# Patient Record
Sex: Female | Born: 1975 | Race: Black or African American | Hispanic: No | Smoking: Never smoker
Health system: Southern US, Community
[De-identification: ages and names within clinical notes are randomized; demographics above are authoritative.]

## PROBLEM LIST (undated history)

## (undated) DIAGNOSIS — J45909 Unspecified asthma, uncomplicated: Secondary | ICD-10-CM

## (undated) DIAGNOSIS — E559 Vitamin D deficiency, unspecified: Secondary | ICD-10-CM

## (undated) DIAGNOSIS — K922 Gastrointestinal hemorrhage, unspecified: Secondary | ICD-10-CM

## (undated) DIAGNOSIS — J069 Acute upper respiratory infection, unspecified: Secondary | ICD-10-CM

## (undated) DIAGNOSIS — R945 Abnormal results of liver function studies: Secondary | ICD-10-CM

## (undated) HISTORY — PX: LIVER SURGERY: SHX698

## (undated) HISTORY — PX: CHOLECYSTECTOMY, LAPAROSCOPIC: SHX56

## (undated) HISTORY — DX: Acute upper respiratory infection, unspecified: J06.9

## (undated) HISTORY — PX: CHOLECYSTECTOMY: SHX55

## (undated) HISTORY — PX: NO PAST SURGERIES: SHX2092

---

## 2003-05-04 DIAGNOSIS — R945 Abnormal results of liver function studies: Secondary | ICD-10-CM

## 2003-05-04 DIAGNOSIS — R7989 Other specified abnormal findings of blood chemistry: Secondary | ICD-10-CM

## 2003-05-04 HISTORY — DX: Other specified abnormal findings of blood chemistry: R79.89

## 2003-05-04 HISTORY — DX: Abnormal results of liver function studies: R94.5

## 2014-05-03 DIAGNOSIS — K922 Gastrointestinal hemorrhage, unspecified: Secondary | ICD-10-CM

## 2014-05-03 HISTORY — DX: Gastrointestinal hemorrhage, unspecified: K92.2

## 2017-02-01 ENCOUNTER — Encounter (HOSPITAL_COMMUNITY): Payer: Self-pay | Admitting: Emergency Medicine

## 2017-02-01 ENCOUNTER — Ambulatory Visit (HOSPITAL_COMMUNITY)
Admission: EM | Admit: 2017-02-01 | Discharge: 2017-02-01 | Disposition: A | Discharge status: Home / Self Care | Payer: 59 | Attending: Family Medicine | Admitting: Family Medicine

## 2017-02-01 DIAGNOSIS — J4521 Mild intermittent asthma with (acute) exacerbation: Secondary | ICD-10-CM

## 2017-02-01 DIAGNOSIS — R059 Cough, unspecified: Secondary | ICD-10-CM

## 2017-02-01 DIAGNOSIS — R05 Cough: Secondary | ICD-10-CM

## 2017-02-01 HISTORY — DX: Unspecified asthma, uncomplicated: J45.909

## 2017-02-01 MED ORDER — PREDNISONE 10 MG (21) PO TBPK: ORAL_TABLET | Freq: Every day | ORAL | 0 refills | Status: DC

## 2017-02-01 MED ORDER — FLUTICASONE-SALMETEROL 500-50 MCG/DOSE IN AEPB: 1.0000 | INHALATION_SPRAY | Freq: Two times a day (BID) | RESPIRATORY_TRACT | 1 refills | Status: DC

## 2017-02-01 MED ORDER — AZITHROMYCIN 250 MG PO TABS: 250.0000 mg | ORAL_TABLET | Freq: Every day | ORAL | 0 refills | Status: DC

## 2017-02-01 NOTE — ED Triage Notes (Signed)
Pt here for cold sx onset 1 month associated w/prod cough, nasal congestion, SOB, wheezing  Hx of asthma  Using her neb tx at home w/no relief  A&O x4... NAD... Ambulatory

## 2017-02-02 NOTE — ED Provider Notes (Signed)
  [place]   [id number] 02/01/17 Arrival Time: 5456  ASSESSMENT & PLAN:  1. Mild intermittent asthma with acute exacerbation   2. Cough     Meds ordered this encounter  Medications  . azithromycin (ZITHROMAX) 250 MG tablet    Sig: Take 1 tablet (250 mg total) by mouth daily. Take first 2 tablets together, then 1 every day until finished.    Dispense:  6 tablet    Refill:  0  . predniSONE (STERAPRED UNI-PAK 21 TAB) 10 MG (21) TBPK tablet    Sig: Take by mouth daily. Take as directed.    Dispense:  21 tablet    Refill:  0  . Fluticasone-Salmeterol (ADVAIR) 500-50 MCG/DOSE AEPB    Sig: Inhale 1 puff into the lungs 2 (two) times daily.    Dispense:  60 each    Refill:  1   To f/u in 24-48 hours if not improving, sooner if needed. Agrees to proceed to the ED if worsening. No indication for hospitalization at this time. Continue home nebs as needed.  Reviewed expectations re: course of current medical issues. Questions answered. Outlined signs and symptoms indicating need for more acute intervention. Patient verbalized understanding. After Visit Summary given.   SUBJECTIVE:  Monique Harrison is a 41 y.o. female who presents with complaint of cold symptoms for the past month. On/off congestion. Wheezing often. No specific SOB. Persistent dry cough. Using home nebs with very temporary relief. Normal PO intake without n/v. Afebrile. No specific aggravating or alleviating factors reported. No OTC treatment. Cannot remember last asthma exacerbation.  ROS: As per HPI. All other systems negative.   OBJECTIVE:  Vitals:   02/01/17 1802  BP: 98/68  Pulse: 93  Resp: 20  Temp: 98.1 F (36.7 C)  TempSrc: Oral  SpO2: 95%    General appearance: alert; no distress Eyes: PERRLA; EOMI; conjunctiva normal HENT: normocephalic; atraumatic; TMs normal; nasal mucosa normal; oral mucosa normal Neck: supple Lungs: expiratory wheezing bilaterally; no respiratory distress Heart:  regular rate and rhythm Extremities: no cyanosis or edema; symmetrical with no gross deformities Skin: warm and dry Psychological: alert and cooperative; normal mood and affect   Allergies  Allergen Reactions  . Aspirin Shortness Of Breath    Past Medical History:  Diagnosis Date  . Asthma    Social History   Social History  . Marital status: Divorced    Spouse name: N/A  . Number of children: N/A  . Years of education: N/A   Occupational History  . Not on file.   Social History Main Topics  . Smoking status: Never Smoker  . Smokeless tobacco: Never Used  . Alcohol use No  . Drug use: No  . Sexual activity: Yes   Other Topics Concern  . Not on file   Social History Narrative  . No narrative on file   FH of asthma.   Vanessa Kick, MD 02/02/17 [phone]

## 2017-02-21 ENCOUNTER — Ambulatory Visit (HOSPITAL_COMMUNITY)
Admission: EM | Admit: 2017-02-21 | Discharge: 2017-02-21 | Disposition: A | Discharge status: Home / Self Care | Payer: 59 | Attending: Emergency Medicine | Admitting: Emergency Medicine

## 2017-02-21 ENCOUNTER — Encounter (HOSPITAL_COMMUNITY): Payer: Self-pay

## 2017-02-21 DIAGNOSIS — J302 Other seasonal allergic rhinitis: Secondary | ICD-10-CM

## 2017-02-21 DIAGNOSIS — J4520 Mild intermittent asthma, uncomplicated: Secondary | ICD-10-CM | POA: Diagnosis not present

## 2017-02-21 MED ORDER — PREDNISONE 10 MG (21) PO TBPK: ORAL_TABLET | Freq: Every day | ORAL | 0 refills | Status: DC

## 2017-02-21 MED ORDER — ALBUTEROL SULFATE HFA 108 (90 BASE) MCG/ACT IN AERS: 1.0000 | INHALATION_SPRAY | Freq: Four times a day (QID) | RESPIRATORY_TRACT | 0 refills | Status: DC | PRN

## 2017-02-21 MED ORDER — CETIRIZINE HCL 10 MG PO TABS: 10.0000 mg | ORAL_TABLET | Freq: Every day | ORAL | 0 refills | Status: DC

## 2017-02-21 NOTE — ED Provider Notes (Signed)
[name]    CSN: [id number] Arrival date & time: 02/21/17  1711     History   Chief Complaint Chief Complaint  Patient presents with  . Allergies    HPI Monique Harrison is a 41 y.o. female.   Monique Harrison presents with complaints of wheezing, shortness of breath, runny nose, congestion scratchy throat and itching eyes. She has a history of asthma, was recently treated with prednisone which helped her symptoms. She went out of town and return, causing increase in symptoms for the past two days ago. She has been using ventolin and advair as well as nebulizers three times a day for her symptoms. She has not been taking daily allergy medication. Without chest pain.       Past Medical History:  Diagnosis Date  . Asthma     There are no active problems to display for this patient.   History reviewed. No pertinent surgical history.  OB History    No data available       Home Medications    Prior to Admission medications   Medication Sig Start Date End Date Taking? Authorizing Provider  Fluticasone-Salmeterol (ADVAIR) 500-50 MCG/DOSE AEPB Inhale 1 puff into the lungs 2 (two) times daily. 02/01/17  Yes Hagler, Aaron Edelman, MD  albuterol (PROVENTIL HFA;VENTOLIN HFA) 108 (90 Base) MCG/ACT inhaler Inhale 1 puff into the lungs every 6 (six) hours as needed for wheezing or shortness of breath. 02/21/17   Zigmund Gottron, NP  azithromycin (ZITHROMAX) 250 MG tablet Take 1 tablet (250 mg total) by mouth daily. Take first 2 tablets together, then 1 every day until finished. 02/01/17   Vanessa Kick, MD  cetirizine (ZYRTEC ALLERGY) 10 MG tablet Take 1 tablet (10 mg total) by mouth daily. 02/21/17   Zigmund Gottron, NP  predniSONE (STERAPRED UNI-PAK 21 TAB) 10 MG (21) TBPK tablet Take by mouth daily. Take 6 tabs by mouth daily  for 2 days, then 5 tabs for 2 days, then 4 tabs for 2 days, then 3 tabs for 2 days, 2 tabs for 2 days, then 1 tab by mouth daily for 2 days 02/21/17   Zigmund Gottron, NP    Family History History reviewed. No pertinent family history.  Social History Social History  Substance Use Topics  . Smoking status: Never Smoker  . Smokeless tobacco: Never Used  . Alcohol use No     Allergies   Aspirin   Review of Systems Review of Systems  Constitutional: Negative.   HENT: Positive for congestion, rhinorrhea and sore throat. Negative for ear pain, postnasal drip, sinus pain, sinus pressure and sneezing.   Eyes: Positive for itching. Negative for pain and discharge.  Respiratory: Positive for shortness of breath and wheezing. Negative for chest tightness.   Cardiovascular: Negative.   Gastrointestinal: Negative.   Genitourinary: Negative.   Neurological: Negative.      Physical Exam Triage Vital Signs ED Triage Vitals [02/21/17 1742]  Enc Vitals Group     BP 106/78     Pulse Rate 89     Resp 17     Temp 97.9 F (36.6 C)     Temp Source Oral     SpO2 95 %     Weight      Height      Head Circumference      Peak Flow      Pain Score      Pain Loc      Pain Edu?  Excl. in [place]?    No data found.   Updated Vital Signs BP 106/78 (BP Location: Left Arm)   Pulse 89   Temp 97.9 F (36.6 C) (Oral)   Resp 17   SpO2 95%   Visual Acuity Right Eye Distance:   Left Eye Distance:   Bilateral Distance:    Right Eye Near:   Left Eye Near:    Bilateral Near:     Physical Exam  Constitutional: She is oriented to person, place, and time. She appears well-developed and well-nourished. No distress.  HENT:  Head: Normocephalic and atraumatic.  Right Ear: External ear normal.  Left Ear: External ear normal.  Nose: Nose normal.  Mouth/Throat: Oropharynx is clear and moist.  Eyes: Pupils are equal, round, and reactive to light. Conjunctivae and EOM are normal.  Cardiovascular: Normal rate, regular rhythm and normal heart sounds.   Pulmonary/Chest: Effort normal. No tachypnea. No respiratory distress. She has wheezes in the  right upper field, the right lower field, the left upper field and the left lower field. She has no rhonchi.  Neurological: She is alert and oriented to person, place, and time.  Skin: Skin is warm and dry.     UC Treatments / Results  Labs (all labs ordered are listed, but only abnormal results are displayed) Labs Reviewed - No data to display  EKG  EKG Interpretation None       Radiology No results found.  Procedures Procedures (including critical care time)  Medications Ordered in UC Medications - No data to display   Initial Impression / Assessment and Plan / UC Course  I have reviewed the triage vital signs and the nursing notes.  Pertinent labs & imaging results that were available during my care of the patient were reviewed by me and considered in my medical decision making (see chart for details).     Repeat course of prednisone as this has recently helped. Recommended daily antihistamine as it appears that allergies are exacerbating asthma symptoms. Continue with use of rescue inhaler as needed, nebulizers as needed. If symptoms worsen, develop chest pain or shortness of breath visit er. Recommended follow up and establish with PCP next week. Patient verbalized understanding and agreeable to plan.    Final Clinical Impressions(s) / UC Diagnoses   Final diagnoses:  Seasonal allergies  Intermittent asthma, unspecified asthma severity, unspecified whether complicated    New Prescriptions Discharge Medication List as of 02/21/2017  6:02 PM    START taking these medications   Details  cetirizine (ZYRTEC ALLERGY) 10 MG tablet Take 1 tablet (10 mg total) by mouth daily., Starting Mon 02/21/2017, Normal         Controlled Substance Prescriptions [place] Controlled Substance Registry consulted? Not Applicable   Zigmund Gottron, NP 02/21/17 6063

## 2017-02-21 NOTE — ED Notes (Signed)
Patient discharged by provider.

## 2017-02-21 NOTE — ED Triage Notes (Signed)
Patient presents to [facility] for allergies and cold onset 1 month associated with cough, nasal congestion, SOB, and wheezing, pt has hx of asthma. No primary physician at this time, was seen here about 3 weeks ago for same condition patient states symptoms have worsen

## 2017-03-31 DIAGNOSIS — J4541 Moderate persistent asthma with (acute) exacerbation: Secondary | ICD-10-CM | POA: Insufficient documentation

## 2017-03-31 DIAGNOSIS — J45909 Unspecified asthma, uncomplicated: Secondary | ICD-10-CM | POA: Insufficient documentation

## 2017-05-13 ENCOUNTER — Ambulatory Visit: Payer: 59 | Admitting: Allergy

## 2017-05-13 ENCOUNTER — Encounter: Payer: Self-pay | Admitting: Allergy

## 2017-05-13 VITALS — BP 112/76 | HR 90 | Temp 98.8°F | Resp 16 | Ht 66.0 in | Wt 269.0 lb

## 2017-05-13 DIAGNOSIS — J4550 Severe persistent asthma, uncomplicated: Secondary | ICD-10-CM

## 2017-05-13 DIAGNOSIS — H1010 Acute atopic conjunctivitis, unspecified eye: Secondary | ICD-10-CM | POA: Diagnosis not present

## 2017-05-13 DIAGNOSIS — J309 Allergic rhinitis, unspecified: Secondary | ICD-10-CM

## 2017-05-13 MED ORDER — TIOTROPIUM BROMIDE MONOHYDRATE 1.25 MCG/ACT IN AERS
2.0000 | INHALATION_SPRAY | Freq: Every day | RESPIRATORY_TRACT | 5 refills | Status: DC
Start: 2017-05-13 — End: 2017-08-29

## 2017-05-13 MED ORDER — BUDESONIDE-FORMOTEROL FUMARATE 160-4.5 MCG/ACT IN AERO: 2.0000 | INHALATION_SPRAY | Freq: Two times a day (BID) | RESPIRATORY_TRACT | 5 refills | Status: DC

## 2017-05-13 NOTE — Patient Instructions (Addendum)
Asthma     - at this time control is poor     - stop Advair and start Symbicort 160 mcg 2 inhalation twice a day.  Symbicort is in the same category as Advair.       - start Spiriva 1.90mcg 2 inhalation once a day     - have access to albuterol inhaler 2 puffs every 4-6 hours as needed for cough/wheeze/shortness of breath/chest tightness.  May use 15-20 minutes prior to activity.   Monitor frequency of use.       - will obtain labs to determine if you qualify for biologic injectable asthma medications to improve control, decrease exacerbations, steroid use and hospitalizations.  Labs to obtain include CBC w diff and environmental allergy profile and total IgE  Asthma control goals:   Full participation in all desired activities (may need albuterol before activity)  Albuterol use two time or less a week on average (not counting use with activity)  Cough interfering with sleep two time or less a month  Oral steroids no more than once a year  No hospitalizations  Allergic rhinoconjunctivitis    - as above will get environmental allergy profile    - trial Xyzal 5mg  daily.  This is a long-acting antihistamine    - for runny nose recommended use of Astelin nasal spray 2 sprays each nostril twice a day    - may use nasal Atrovent spray 2 sprays each nostril up to 4 times a day as needed for runny nose.     Follow-up 3 months or sooner if needed

## 2017-05-13 NOTE — Progress Notes (Signed)
New Patient Note  RE: Monique Harrison MRN: [id number] DOB: [birth year] Date of Office Visit: 05/13/2017  Referring provider: No ref. provider found Primary care provider: Patient, No Pcp Per  Chief Complaint: very bad asthma and runny nose  History of present illness: Monique Harrison is a 42 y.o. female presenting today for evaluation of asthma and allergies.    She moved here in September 2018 from [place], [place] and since she has moved here she has been to UC 3 times and another doctor office twice for asthma related symptoms.  She states her asthma has been much worse here.  She as diagnosed with asthma at 42yo after having PNA.  She has coughing, wheezing, SOB, chest tightness.   Change in weather, nighttime, dust, illnesses, fresh cut grass, overexertion/exercise, smoke are triggers for her.  She takes Advair diskus 500 1 puff twice a day and has proair and nebulizer for as needed use.  She has been on advair since 2009.  She uses albuterol about 4 times a month on average.  Denies any nighttime awakenings.    She reports last hospitalization for asthma was when she was 42yo.  She reports needing steroids either injection and/or orally in November and December of 2018 that she can recall.  She reports at least yearly needing steroids for exacerbations.      She reports her nose is always runny.  Lately she has been sneezing as well.  She has taken zyrtec D and allegra D in the past which she reports both stopped working.  She reports nose sprays has given her headache but does not recall which nose sprays she has used in the past.  She has had 2 allergy test in the past with last in 2015 or 2016 while living in [facility] and reports she is allergic to everything.   She denies a history of eczema or food allergy.   She is currently looking for  PCP but states she has found a gynecologist.     Review of systems: Review of Systems  Constitutional: Negative for chills, fever and malaise/fatigue.    HENT: Positive for congestion. Negative for ear discharge, ear pain, nosebleeds, sinus pain and sore throat.   Eyes: Negative for pain, discharge and redness.  Respiratory: Positive for cough, shortness of breath and wheezing. Negative for hemoptysis and sputum production.   Cardiovascular: Negative for chest pain.  Gastrointestinal: Negative for abdominal pain, constipation, diarrhea, heartburn, nausea and vomiting.  Musculoskeletal: Negative for joint pain and myalgias.  Skin: Negative for itching and rash.  Neurological: Negative for headaches.    All other systems negative unless noted above in HPI  Past medical history: Past Medical History:  Diagnosis Date  . Asthma   . Recurrent upper respiratory infection (URI)     Past surgical history: Past Surgical History:  Procedure Laterality Date  . NO PAST SURGERIES      Family history:  Family History  Problem Relation Age of Onset  . Allergic rhinitis Father     Social history: She lives in a home with carpeting with electric heating and central cooling.  There are no pets in the home.  There is no concern for water damage, mildew or roaches in the home.  She works as a Product/process development scientist.  She has no smoking history.   Medication List: Allergies as of 05/13/2017      Reactions   Aspirin Shortness Of Breath      Medication List  Accurate as of 05/13/17  1:53 PM. Always use your most recent med list.          albuterol 108 (90 Base) MCG/ACT inhaler Commonly known as:  PROVENTIL HFA;VENTOLIN HFA Inhale 1 puff into the lungs every 6 (six) hours as needed for wheezing or shortness of breath.   budesonide-formoterol 160-4.5 MCG/ACT inhaler Commonly known as:  SYMBICORT Inhale 2 puffs into the lungs 2 (two) times daily.   cetirizine 10 MG tablet Commonly known as:  ZYRTEC ALLERGY Take 1 tablet (10 mg total) by mouth daily.   Fluticasone-Salmeterol 500-50 MCG/DOSE Aepb Commonly known as:  ADVAIR Inhale 1 puff  into the lungs 2 (two) times daily.   Tiotropium Bromide Monohydrate 1.25 MCG/ACT Aers Commonly known as:  SPIRIVA RESPIMAT Inhale 2 puffs into the lungs daily.       Known medication allergies: Allergies  Allergen Reactions  . Aspirin Shortness Of Breath     Physical examination: Blood pressure 112/76, pulse 90, temperature 98.8 F (37.1 C), temperature source Oral, resp. rate 16, height 5\' 6"  (1.676 m), weight 269 lb (122 kg), SpO2 96 %.  General: Alert, interactive, in no acute distress. HEENT: PERRLA, xanthelasmas on eyelid b/l, TMs pearly gray, turbinates moderately edematous with clear discharge, post-pharynx non erythematous. Neck: Supple without lymphadenopathy. Lungs: Mildly decreased breath sounds bilaterally without wheezing, rhonchi or rales. {no increased work of breathing. Slight improvement in aeration following duoneb.  CV: Normal S1, S2 without murmurs. Abdomen: Nondistended, nontender. Skin: Warm and dry, without lesions or rashes. Extremities:  No clubbing, cyanosis or edema. Neuro:   Grossly intact.  Diagnositics/Labs:  Spirometry: FEV1: 1.45L  53%, FVC: 1.89L  57% there is no significant improvement status post DuoNeb.  FEV1 increased to 1.47 L.  Allergy testing: deferred due to poor lung function   Assessment and plan:   Asthma, severe persistent     - at this time control is poor     - stop Advair and start Symbicort 160 mcg 2 inhalation twice a day.     - start Spiriva 1.65mcg 2 inhalation once a day     - have access to albuterol inhaler 2 puffs every 4-6 hours as needed for cough/wheeze/shortness of breath/chest tightness.  May use 15-20 minutes prior to activity.   Monitor frequency of use.       - will obtain labs to determine if you qualify for biologic injectable asthma medications to improve control, decrease exacerbations, steroid use and hospitalizations.  Labs to obtain include CBC w diff and environmental allergy profile and total  IgE  Asthma control goals:   Full participation in all desired activities (may need albuterol before activity)  Albuterol use two time or less a week on average (not counting use with activity)  Cough interfering with sleep two time or less a month  Oral steroids no more than once a year  No hospitalizations  Allergic rhinoconjunctivitis    - as above will get environmental allergy profile    - trial Xyzal 5mg  daily.  This is a long-acting antihistamine    - for runny nose recommended use of Astelin nasal spray 2 sprays each nostril twice a day    - may use nasal Atrovent spray 2 sprays each nostril up to 4 times a day as needed for runny nose.     Follow-up 3 months or sooner if needed  I appreciate the opportunity to take part in [facility] care. Please do not hesitate to contact me with  questions.  Sincerely,   Prudy Feeler, MD Allergy/Immunology [facility]

## 2017-05-16 LAB — IGE+ALLERGENS ZONE 2(30)
Alternaria Alternata IgE: <0.1 kU/L
Amer Sycamore IgE Qn: 0.21 kU/L — AB
Aspergillus Fumigatus IgE: <0.1 kU/L
Cedar, Mountain IgE: 0.85 kU/L — AB
Cladosporium Herbarum IgE: <0.1 kU/L
D Farinae IgE: 1.49 kU/L — AB
D Pteronyssinus IgE: 0.26 kU/L — AB
E001-IGE CAT DANDER: 14.3 kU/L — AB
E005-IGE DOG DANDER: 1.19 kU/L — AB
Elm, American IgE: 0.21 kU/L — AB
G002-IGE BERMUDA GRASS: 0.47 kU/L — AB
G010-IGE JOHNSON GRASS: 1.43 kU/L — AB
G017-IGE BAHIA GRASS: 2.04 kU/L — AB
Hickory, White IgE: 0.19 kU/L — AB
IgE (Immunoglobulin E), Serum: 73 IU/mL (ref 0–100)
MUGWORT IGE QN: 0.11 kU/L — AB
Nettle IgE: 0.12 kU/L — AB
Penicillium Chrysogen IgE: <0.1 kU/L
Pigweed, Rough IgE: 0.13 kU/L — AB
SWEET GUM IGE RAST QL: 0.27 kU/L — AB
T001-IGE MAPLE/BOX ELDER: 0.17 kU/L — AB
T003-IGE COMMON SILVER BIRCH: 0.28 kU/L — AB
T007-IGE OAK, WHITE: 1.97 kU/L — AB
TIMOTHY IGE: 1.42 kU/L — AB
W001-IGE RAGWEED, SHORT: 0.3 kU/L — AB
W009-IGE PLANTAIN, ENGLISH: 0.13 kU/L — AB
White Mulberry IgE: <0.1 kU/L

## 2017-05-16 LAB — CBC WITH DIFFERENTIAL/PLATELET
Basophils Absolute: 0.1 10*3/uL (ref 0.0–0.2)
Basos: 0 %
EOS (ABSOLUTE): 0.5 10*3/uL — AB (ref 0.0–0.4)
Eos: 4 %
Hematocrit: 40.8 % (ref 34.0–46.6)
Hemoglobin: 13.3 g/dL (ref 11.1–15.9)
IMMATURE GRANULOCYTES: 0 %
Immature Grans (Abs): 0 10*3/uL (ref 0.0–0.1)
Lymphocytes Absolute: 2 10*3/uL (ref 0.7–3.1)
Lymphs: 15 %
MCH: 30.5 pg (ref 26.6–33.0)
MCHC: 32.6 g/dL (ref 31.5–35.7)
MCV: 94 fL (ref 79–97)
Monocytes Absolute: 0.7 10*3/uL (ref 0.1–0.9)
Monocytes: 5 %
NEUTROS PCT: 76 %
Neutrophils Absolute: 9.6 10*3/uL — ABNORMAL HIGH (ref 1.4–7.0)
PLATELETS: 304 10*3/uL (ref 150–379)
RBC: 4.36 x10E6/uL (ref 3.77–5.28)
RDW: 13.9 % (ref 12.3–15.4)
WBC: 12.9 10*3/uL — AB (ref 3.4–10.8)

## 2017-06-20 ENCOUNTER — Other Ambulatory Visit: Payer: Self-pay | Admitting: Obstetrics and Gynecology

## 2017-06-20 DIAGNOSIS — R928 Other abnormal and inconclusive findings on diagnostic imaging of breast: Secondary | ICD-10-CM

## 2017-06-27 ENCOUNTER — Other Ambulatory Visit: Payer: Self-pay | Admitting: Obstetrics and Gynecology

## 2017-06-27 ENCOUNTER — Ambulatory Visit
Admission: RE | Admit: 2017-06-27 | Discharge: 2017-06-27 | Disposition: A | Discharge status: Home / Self Care | Payer: 59 | Source: Ambulatory Visit | Attending: Obstetrics and Gynecology | Admitting: Obstetrics and Gynecology

## 2017-06-27 DIAGNOSIS — N6310 Unspecified lump in the right breast, unspecified quadrant: Secondary | ICD-10-CM

## 2017-06-27 DIAGNOSIS — R928 Other abnormal and inconclusive findings on diagnostic imaging of breast: Secondary | ICD-10-CM

## 2017-07-11 ENCOUNTER — Telehealth: Payer: Self-pay | Admitting: Allergy

## 2017-07-11 NOTE — Telephone Encounter (Signed)
Called patient and discussed options with her.  She wants to try Berna Bue and I explained submission process and advised her would be in touch in next week or so to get her started.

## 2017-07-11 NOTE — Telephone Encounter (Signed)
Okay. Thank you ma'am. If you need me to do anything else, let me know.

## 2017-07-11 NOTE — Telephone Encounter (Signed)
Tammy do you have a file on this patient?

## 2017-07-11 NOTE — Telephone Encounter (Signed)
Patient was supposed to be set up for asthma shots Information was supposed to be sent to her insurance to see if they would cover Patient has heard nothing and is calling to follow up

## 2017-07-11 NOTE — Telephone Encounter (Signed)
Unfortunately I was never forwarded message. Unsure if info was sent out but I will contact patient and discuss her options

## 2017-07-28 ENCOUNTER — Telehealth: Payer: Self-pay

## 2017-07-28 MED ORDER — ALBUTEROL SULFATE (2.5 MG/3ML) 0.083% IN NEBU: 2.5000 mg | INHALATION_SOLUTION | RESPIRATORY_TRACT | 1 refills | Status: DC | PRN

## 2017-07-28 MED ORDER — PREDNISONE 10 MG PO TABS: 20.0000 mg | ORAL_TABLET | Freq: Two times a day (BID) | ORAL | 0 refills | Status: AC

## 2017-07-28 NOTE — Telephone Encounter (Signed)
See Tammy's note.

## 2017-07-28 NOTE — Telephone Encounter (Signed)
Called patient and advised her to contact pharmacy to ship and rcvd call from pharmacy and will ship for tomorrow. I called patient and advised she can come earlier than her 4/11 appt so she will be coming 4/2 appt made.  She inquired regarding her call for meds this am to [place] so I advised her of Dr Nelva Bush instructions.  She did advise she was having cough and wheeze with using daily meds.  So I told her I would send meds as directed by Dr Nelva Bush

## 2017-07-28 NOTE — Addendum Note (Signed)
Addended by: Carin Hock on: 07/28/2017 11:38 AM   Modules accepted: Orders

## 2017-07-28 NOTE — Telephone Encounter (Signed)
Patient called this morning to request a refill on her albuterol for her nebulizer machine. She would also like to know if we could send in a steroid dose pack. She is continuing to have asthma problems despite using all medications as instructed. She is schedule for her new start Berna Bue on 08-11-17 as well as an office visit the same day with you. I am sending a message to Tammy as well to follow up on the [place] because patient states the pharmacy called her but she has not heard anything else from [place].

## 2017-07-28 NOTE — Telephone Encounter (Signed)
Did she say what symptoms she was still having?  She does have rather poor asthma control.  If she is using her symbicort 2 puffs twice a day and spirava 2 puffs once day and her albuterol prn and still symptomatic we can send her prednisone 20mg  twice a day x 5 days.

## 2017-08-02 ENCOUNTER — Ambulatory Visit (INDEPENDENT_AMBULATORY_CARE_PROVIDER_SITE_OTHER): Payer: 59 | Admitting: *Deleted

## 2017-08-02 DIAGNOSIS — J4550 Severe persistent asthma, uncomplicated: Secondary | ICD-10-CM | POA: Diagnosis not present

## 2017-08-02 MED ORDER — BENRALIZUMAB 30 MG/ML ~~LOC~~ SOSY
30.0000 mg | PREFILLED_SYRINGE | SUBCUTANEOUS | Status: AC
  Administered 2017-08-02 – 2017-09-27 (×3): 30 mg via SUBCUTANEOUS

## 2017-08-03 NOTE — Progress Notes (Signed)
Immunotherapy   Patient Details  Name: Monique Harrison MRN: [id number] Date of Birth: 28-Apr-1976  08/03/2017  Monique Harrison started injections for  Fasenra 30 mg every 28 days for 3 doses then continue every 8 weeks. Epi-Pen:Epi-Pen Available  Consent signed and patient instructions given. No problems after 30 minutes in the office.   Horris Latino 08/03/2017, 8:39 AM

## 2017-08-11 ENCOUNTER — Ambulatory Visit: Payer: Self-pay

## 2017-08-11 ENCOUNTER — Ambulatory Visit: Payer: 59 | Admitting: Family Medicine

## 2017-08-16 ENCOUNTER — Ambulatory Visit: Payer: Self-pay

## 2017-08-29 ENCOUNTER — Encounter: Payer: Self-pay | Admitting: Allergy

## 2017-08-29 ENCOUNTER — Ambulatory Visit: Payer: Self-pay

## 2017-08-29 ENCOUNTER — Ambulatory Visit: Payer: 59 | Admitting: Allergy

## 2017-08-29 VITALS — BP 116/76 | HR 81 | Resp 18

## 2017-08-29 DIAGNOSIS — J4550 Severe persistent asthma, uncomplicated: Secondary | ICD-10-CM

## 2017-08-29 DIAGNOSIS — H1010 Acute atopic conjunctivitis, unspecified eye: Secondary | ICD-10-CM | POA: Diagnosis not present

## 2017-08-29 DIAGNOSIS — J309 Allergic rhinitis, unspecified: Secondary | ICD-10-CM | POA: Diagnosis not present

## 2017-08-29 MED ORDER — FLUTICASONE-SALMETEROL 500-50 MCG/DOSE IN AEPB: 1.0000 | INHALATION_SPRAY | Freq: Two times a day (BID) | RESPIRATORY_TRACT | 5 refills | Status: DC

## 2017-08-29 MED ORDER — AZELASTINE HCL 0.1 % NA SOLN: 2.0000 | Freq: Two times a day (BID) | NASAL | 5 refills | Status: DC

## 2017-08-29 MED ORDER — FLUTICASONE PROPIONATE HFA 220 MCG/ACT IN AERO: 2.0000 | INHALATION_SPRAY | Freq: Two times a day (BID) | RESPIRATORY_TRACT | 5 refills | Status: DC

## 2017-08-29 MED ORDER — ALBUTEROL SULFATE HFA 108 (90 BASE) MCG/ACT IN AERS: 2.0000 | INHALATION_SPRAY | RESPIRATORY_TRACT | 1 refills | Status: DC | PRN

## 2017-08-29 MED ORDER — ALBUTEROL SULFATE (2.5 MG/3ML) 0.083% IN NEBU: 2.5000 mg | INHALATION_SOLUTION | RESPIRATORY_TRACT | 1 refills | Status: DC | PRN

## 2017-08-29 MED ORDER — MONTELUKAST SODIUM 10 MG PO TABS: 10.0000 mg | ORAL_TABLET | Freq: Every day | ORAL | 5 refills | Status: DC

## 2017-08-29 NOTE — Progress Notes (Signed)
Follow-up Note  RE: Monique Harrison MRN: [id number] DOB: 1975-06-04 Date of Office Visit: 08/29/2017   History of present illness: Monique Harrison is a 42 y.o. female presenting today for follow-up of severe persistent asthma and allergic rhinoconjunctivitis.  She was last seen in the office on May 13, 2017 by myself.  Since this time she states she has not really done much better.  I had her to stop her Advair and trial Symbicort.  I also had her try Spiriva.  However she does not feel this had any effect with her symptoms thus she stopped this.  She states she went back to taking Advair with the Symbicort she is using her albuterol nebulizer twice a day.  She does not have an albuterol inhaler at this time.  She still feels very symptomatic.  We did start her on [place] and she has had 1 dose of this thus far.    With her allergies she still reports a lot of nasal drainage and congestion and postnasal drip.  I have recommended that she try Astelin however she does not think that she picked this up from the pharmacy.  She states she has been using Nasacort which works very temporarily for her.  She also tried Xyzal after last visit but does not feel that it was helpful that she stop this.  She has been going back between Allegra-D and Zyrtec.  Review of systems: Review of Systems  Constitutional: Negative for chills, fever and malaise/fatigue.  HENT: Positive for congestion. Negative for ear discharge, ear pain, nosebleeds, sinus pain and sore throat.   Eyes: Negative for pain, discharge and redness.  Respiratory: Positive for cough, shortness of breath and wheezing.   Cardiovascular: Negative for chest pain.  Gastrointestinal: Negative for abdominal pain, constipation, diarrhea, heartburn, nausea and vomiting.  Musculoskeletal: Negative for joint pain.  Skin: Negative for itching and rash.  Neurological: Negative for headaches.    All other systems negative unless noted above in  HPI  Past medical/social/surgical/family history have been reviewed and are unchanged unless specifically indicated below.  No changes  Medication List: Allergies as of 08/29/2017      Reactions   Aspirin Shortness Of Breath      Medication List        Accurate as of 08/29/17  5:11 PM. Always use your most recent med list.          albuterol 108 (90 Base) MCG/ACT inhaler Commonly known as:  PROVENTIL HFA;VENTOLIN HFA Inhale 1 puff into the lungs every 6 (six) hours as needed for wheezing or shortness of breath.   albuterol (2.5 MG/3ML) 0.083% nebulizer solution Commonly known as:  PROVENTIL Take 3 mLs (2.5 mg total) by nebulization every 4 (four) hours as needed for wheezing or shortness of breath.   cetirizine 10 MG tablet Commonly known as:  ZYRTEC ALLERGY Take 1 tablet (10 mg total) by mouth daily.   Fluticasone-Salmeterol 500-50 MCG/DOSE Aepb Commonly known as:  ADVAIR Inhale 1 puff into the lungs 2 (two) times daily.       Known medication allergies: Allergies  Allergen Reactions  . Aspirin Shortness Of Breath     Physical examination: Blood pressure 116/76, pulse 81, resp. rate 18, SpO2 94 %.  General: Alert, interactive, in no acute distress. HEENT: PERRLA, TMs pearly gray, turbinates moderately edematous with clear discharge, post-pharynx non erythematous. Neck: Supple without lymphadenopathy. Lungs: Mildly decreased breath sounds with expiratory wheezing bilaterally. {no increased work of breathing. CV: Normal  S1, S2 without murmurs. Abdomen: Nondistended, nontender. Skin: Warm and dry, without lesions or rashes. Extremities:  No clubbing, cyanosis or edema. Neuro:   Grossly intact.  Diagnositics/Labs: Labs:  Component     Latest Ref Rng & Units 05/13/2017  IgE (Immunoglobulin E), Serum     0 - 100 IU/mL 73  D Pteronyssinus IgE     Class 0/I kU/L 0.26 (A)  D Farinae IgE     Class III kU/L 1.49 (A)  Cat Dander IgE     Class IV kU/L 14.30 (A)   Dog Dander IgE     Class II kU/L 1.19 (A)  [place] Grass IgE     Class I kU/L 0.47 (A)  Timothy Grass IgE     Class III kU/L 1.42 (A)  Johnson Grass IgE     Class III kU/L 1.43 (A)  Bahia Grass IgE     Class III kU/L 2.04 (A)  Cockroach, American IgE     Class 0 kU/L <0.10  Penicillium Chrysogen IgE     Class 0 kU/L <0.10  Cladosporium Herbarum IgE     Class 0 kU/L <0.10  Aspergillus Fumigatus IgE     Class 0 kU/L <0.10  Mucor Racemosus IgE     Class 0 kU/L <0.10  Alternaria Alternata IgE     Class 0 kU/L <0.10  Stemphylium Herbarum IgE     Class 0 kU/L <0.10  Common Silver Wendee Copp IgE     Class 0/I kU/L 0.28 (A)  Oak, White IgE     Class III kU/L 1.97 (A)  Elm, American IgE     Class 0/I kU/L 0.21 (A)  Maple/Box Elder IgE     Class 0/I kU/L 0.17 (A)  Hickory, White IgE     Class 0/I kU/L 0.19 (A)  Amer Sycamore IgE Qn     Class 0/I kU/L 0.21 (A)  White Mulberry IgE     Class 0 kU/L <0.10  Sweet gum IgE RAST Ql     Class 0/I kU/L 0.27 (A)  Cedar, Mountain IgE     Class II kU/L 0.85 (A)  Ragweed, Short IgE     Class 0/I kU/L 0.30 (A)  Mugwort IgE Qn     Class 0/I kU/L 0.11 (A)  Plantain, English IgE     Class 0/I kU/L 0.13 (A)  Pigweed, Rough IgE     Class 0/I kU/L 0.13 (A)  Sheep Sorrel IgE Qn     Class 0 kU/L <0.10  Nettle IgE     Class 0/I kU/L 0.12 (A)   Component     Latest Ref Rng & Units 05/13/2017  WBC     3.4 - 10.8 x10E3/uL 12.9 (H)  RBC     3.77 - 5.28 x10E6/uL 4.36  Hemoglobin     11.1 - 15.9 g/dL 13.3  HCT     34.0 - 46.6 % 40.8  MCV     79 - 97 fL 94  MCH     26.6 - 33.0 pg 30.5  MCHC     31.5 - 35.7 g/dL 32.6  RDW     12.3 - 15.4 % 13.9  Platelets     150 - 379 x10E3/uL 304  Neutrophils     Not Estab. % 76  Lymphs     Not Estab. % 15  Monocytes     Not Estab. % 5  Eos     Not Estab. % 4  Basos  Not Estab. % 0  NEUT#     1.4 - 7.0 x10E3/uL 9.6 (H)  Lymphocyte #     0.7 - 3.1 x10E3/uL 2.0  Monocytes Absolute      0.1 - 0.9 x10E3/uL 0.7  EOS (ABSOLUTE)     0.0 - 0.4 x10E3/uL 0.5 (H)  Basophils Absolute     0.0 - 0.2 x10E3/uL 0.1  Immature Granulocytes     Not Estab. % 0  Immature Grans (Abs)     0.0 - 0.1 x10E3/uL 0.0    Spirometry: FEV1: 1.24L  45%, FVC: 1.67L  50%, ratio consistent with Severe restrictive pattern However ATS criteria was not met.  Assessment and plan:   Asthma, severe persistent     - at this time control  remains poor     - resume Advair diskus 500/50 1 puffs twice a day     - stop symbicort.  Will replace with Flovent 284mg 2 puffs twice a day with your Advair     - start singulair 155mdaily. Take a bedtime     - have access to albuterol inhaler 2 puffs or nebulizer 1 vial every 4-6 hours as needed for cough/wheeze/shortness of breath/chest tightness.  May use 15-20 minutes prior to activity.   Monitor frequency of use.       - continue Fasenra injections next injection in a month then it becomes every 8 weeks.    Asthma control goals:   Full participation in all desired activities (may need albuterol before activity)  Albuterol use two time or less a week on average (not counting use with activity)  Cough interfering with sleep two time or less a month  Oral steroids no more than once a year  No hospitalizations  Allergic rhinoconjunctivitis    - avoidance measures for dust mites, cat, dog, grasses, trees, weeds    - provided with samples of Ryvent, this is a first generation antihistamine that you can take 1 tab twice a day.  You can see if this antihistamine provides better effect than Xyzal, Zyrtec, Allegra.      - for runny nose recommended use of Astelin nasal spray 2 sprays each nostril twice a day    - for nasal congestion use nasacort 2 sprays each nostril daily.  Use for 1-2 weeks at time before stopping once symptoms improve.      - Singulair as above      -At this time she does not qualify for allergen immunotherapy due to poor asthma  control  Follow-up 3-4 months or sooner if needed   I appreciate the opportunity to take part in [place]. Please do not hesitate to contact me with questions.  Sincerely,   ShPrudy FeelerMD Allergy/Immunology [facility] [place]

## 2017-08-29 NOTE — Patient Instructions (Addendum)
Asthma     - at this time control is poor     - resume Advair diskus 500/50 1 puffs twice a day     - stop symbicort.  Will replace with Flovent 248mcg 2 puffs twice a day with your Advair     - start singulair 10mg  daily. Take a bedtime     - have access to albuterol inhaler 2 puffs or nebulizer 1 vial every 4-6 hours as needed for cough/wheeze/shortness of breath/chest tightness.  May use 15-20 minutes prior to activity.   Monitor frequency of use.       - continue Fasenra injections next injection in a month then it become becomes every 8 weeks.    Asthma control goals:   Full participation in all desired activities (may need albuterol before activity)  Albuterol use two time or less a week on average (not counting use with activity)  Cough interfering with sleep two time or less a month  Oral steroids no more than once a year  No hospitalizations  Allergic rhinoconjunctivitis    - avoidance measures for dust mites, cat, dog, grasses, trees, weeds    - provided with samples of Ryvent, this is a first generation antihistamine that you can take 1 tab twice a day.  You can see if this antihistamine provides better effect than Xyzal, Zyrtec, Allegra.      - for runny nose recommended use of Astelin nasal spray 2 sprays each nostril twice a day    - for nasal congestion use nasacort 2 sprays each nostril daily.  Use for 1-2 weeks at time before stopping once symptoms improve.      - Singulair as above   Follow-up 3-4 months or sooner if needed

## 2017-09-27 ENCOUNTER — Ambulatory Visit (INDEPENDENT_AMBULATORY_CARE_PROVIDER_SITE_OTHER): Payer: 59 | Admitting: *Deleted

## 2017-09-27 DIAGNOSIS — J4550 Severe persistent asthma, uncomplicated: Secondary | ICD-10-CM

## 2017-10-25 ENCOUNTER — Ambulatory Visit: Payer: 59

## 2017-11-22 ENCOUNTER — Ambulatory Visit (INDEPENDENT_AMBULATORY_CARE_PROVIDER_SITE_OTHER): Payer: 59 | Admitting: *Deleted

## 2017-11-22 DIAGNOSIS — J4550 Severe persistent asthma, uncomplicated: Secondary | ICD-10-CM | POA: Diagnosis not present

## 2017-11-22 MED ORDER — BENRALIZUMAB 30 MG/ML ~~LOC~~ SOSY
30.0000 mg | PREFILLED_SYRINGE | SUBCUTANEOUS | Status: DC
  Administered 2017-11-22: 30 mg via SUBCUTANEOUS

## 2017-12-25 ENCOUNTER — Other Ambulatory Visit: Payer: Self-pay

## 2017-12-25 ENCOUNTER — Encounter (HOSPITAL_COMMUNITY): Payer: Self-pay | Admitting: Emergency Medicine

## 2017-12-25 ENCOUNTER — Emergency Department (HOSPITAL_COMMUNITY)
Admission: EM | Admit: 2017-12-25 | Discharge: 2017-12-25 | Disposition: A | Discharge status: Home / Self Care | Payer: 59 | Attending: Emergency Medicine | Admitting: Emergency Medicine

## 2017-12-25 DIAGNOSIS — J45909 Unspecified asthma, uncomplicated: Secondary | ICD-10-CM | POA: Insufficient documentation

## 2017-12-25 DIAGNOSIS — L02421 Furuncle of right axilla: Secondary | ICD-10-CM | POA: Diagnosis present

## 2017-12-25 DIAGNOSIS — L02411 Cutaneous abscess of right axilla: Secondary | ICD-10-CM | POA: Insufficient documentation

## 2017-12-25 DIAGNOSIS — Z79899 Other long term (current) drug therapy: Secondary | ICD-10-CM | POA: Insufficient documentation

## 2017-12-25 MED ORDER — SULFAMETHOXAZOLE-TRIMETHOPRIM 800-160 MG PO TABS: 1.0000 | ORAL_TABLET | Freq: Two times a day (BID) | ORAL | 0 refills | Status: DC

## 2017-12-25 MED ORDER — LIDOCAINE-EPINEPHRINE (PF) 2 %-1:200000 IJ SOLN
20.0000 mL | Freq: Once | INTRAMUSCULAR | Status: AC
  Administered 2017-12-25: 20 mL
  Filled 2017-12-25: qty 20

## 2017-12-25 MED ORDER — SULFAMETHOXAZOLE-TRIMETHOPRIM 800-160 MG PO TABS
1.0000 | ORAL_TABLET | Freq: Once | ORAL | Status: AC
  Administered 2017-12-25: 1 via ORAL
  Filled 2017-12-25: qty 1

## 2017-12-25 MED ORDER — SULFAMETHOXAZOLE-TRIMETHOPRIM 800-160 MG PO TABS: 1.0000 | ORAL_TABLET | Freq: Two times a day (BID) | ORAL | 0 refills | Status: AC

## 2017-12-25 NOTE — ED Notes (Signed)
ED Provider at bedside. 

## 2017-12-25 NOTE — ED Notes (Signed)
Patient verbalizes understanding of discharge instructions. Opportunity for questioning and answers were provided. Armband removed by staff, pt discharged from ED ambulatory.   

## 2017-12-25 NOTE — ED Provider Notes (Signed)
[facility] EMERGENCY DEPARTMENT Provider Note   CSN: [id number] Arrival date & time: 12/25/17  1817     History   Chief Complaint Chief Complaint  Patient presents with  . Abscess    HPI Monique Harrison is a 42 y.o. female here for evaluation of boil to right axilla. She noticed an ingrown hair 3 weeks ago.  Area has been mildly tender and red since, her son tried to pop it and now it is larger, redder and more painful.  It began draining yellow discharge yesterday.  Has been applying warm compresses and salt water to the area.  H/o boils that drain and resolve on their own to her groin.  No fevers.   HPI  Past Medical History:  Diagnosis Date  . Asthma   . Recurrent upper respiratory infection (URI)     There are no active problems to display for this patient.   Past Surgical History:  Procedure Laterality Date  . NO PAST SURGERIES       OB History   None      Home Medications    Prior to Admission medications   Medication Sig Start Date End Date Taking? Authorizing Provider  albuterol (PROVENTIL HFA;VENTOLIN HFA) 108 (90 Base) MCG/ACT inhaler Inhale 2 puffs into the lungs every 4 (four) hours as needed for wheezing or shortness of breath. 08/29/17   Padgett, Rae Halsted, MD  albuterol (PROVENTIL) (2.5 MG/3ML) 0.083% nebulizer solution Take 3 mLs (2.5 mg total) by nebulization every 4 (four) hours as needed for wheezing or shortness of breath. 08/29/17   Padgett, Rae Halsted, MD  azelastine (ASTELIN) 0.1 % nasal spray Place 2 sprays into both nostrils 2 (two) times daily. 08/29/17   Kennith Gain, MD  cetirizine (ZYRTEC ALLERGY) 10 MG tablet Take 1 tablet (10 mg total) by mouth daily. 02/21/17   Zigmund Gottron, NP  fluticasone (FLOVENT HFA) 220 MCG/ACT inhaler Inhale 2 puffs into the lungs 2 (two) times daily. 08/29/17   Kennith Gain, MD  Fluticasone-Salmeterol (ADVAIR) 500-50 MCG/DOSE AEPB Inhale 1 puff into the  lungs 2 (two) times daily. 08/29/17   Kennith Gain, MD  montelukast (SINGULAIR) 10 MG tablet Take 1 tablet (10 mg total) by mouth at bedtime. 08/29/17   Kennith Gain, MD  sulfamethoxazole-trimethoprim (BACTRIM DS,SEPTRA DS) 800-160 MG tablet Take 1 tablet by mouth 2 (two) times daily for 7 days. 12/25/17 01/01/18  Kinnie Feil, PA-C    Family History Family History  Problem Relation Age of Onset  . Allergic rhinitis Father   . Breast cancer Maternal Grandmother     Social History Social History   Tobacco Use  . Smoking status: Never Smoker  . Smokeless tobacco: Never Used  Substance Use Topics  . Alcohol use: No  . Drug use: No     Allergies   Aspirin   Review of Systems Review of Systems  All other systems reviewed and are negative.    Physical Exam Updated Vital Signs BP 109/72 (BP Location: Right Arm)   Pulse 76   Temp 98.7 F (37.1 C) (Oral)   Resp 20   Ht 5\' 6"  (1.676 m)   Wt 124.7 kg   SpO2 98%   BMI 44.39 kg/m   Physical Exam  Constitutional: She is oriented to person, place, and time. She appears well-developed and well-nourished.  Non-toxic appearance.  HENT:  Head: Normocephalic.  Right Ear: External ear normal.  Left Ear: External ear normal.  Nose: Nose normal.  Eyes: Conjunctivae and EOM are normal.  Neck: Full passive range of motion without pain.  Cardiovascular: Normal rate.  Pulmonary/Chest: Effort normal. No tachypnea. No respiratory distress.  Musculoskeletal: Normal range of motion.  Neurological: She is alert and oriented to person, place, and time.  Skin: Skin is warm and dry. Capillary refill takes less than 2 seconds.  Quarter sized area of fluctuance, erythema, tenderness to right axilla with open center draining thick yellow drainage.  Smaller inflammatory pustules to right axilla noted.   Psychiatric: Her behavior is normal. Thought content normal.     ED Treatments / Results  Labs (all labs  ordered are listed, but only abnormal results are displayed) Labs Reviewed - No data to display  EKG None  Radiology No results found.  Procedures .Marland KitchenIncision and Drainage Date/Time: 12/25/2017 7:47 PM Performed by: Kinnie Feil, PA-C Authorized by: Kinnie Feil, PA-C   Consent:    Consent obtained:  Verbal   Consent given by:  Patient   Risks discussed:  Bleeding, incomplete drainage, pain and damage to other organs   Alternatives discussed:  Alternative treatment (no I&D and antibiotics only) Universal protocol:    Procedure explained and questions answered to patient or proxy's satisfaction: yes     Relevant documents present and verified: yes     Test results available and properly labeled: yes     Imaging studies available: yes     Required blood products, implants, devices, and special equipment available: yes     Site/side marked: yes     Immediately prior to procedure a time out was called: yes     Patient identity confirmed:  Verbally with patient Location:    Type:  Abscess   Size:  Quarter sized   Location:  Upper extremity   Upper extremity location: axilla. Pre-procedure details:    Skin preparation:  Betadine Anesthesia (see MAR for exact dosages):    Anesthesia method:  Local infiltration   Local anesthetic:  Lidocaine 2% WITH epi Procedure type:    Complexity:  Complex Procedure details:    Incision types:  Single straight   Incision depth:  Subcutaneous   Scalpel blade:  11   Wound management:  Probed and deloculated   Drainage:  Purulent   Drainage amount:  Copious   Packing materials:  None Post-procedure details:    Patient tolerance of procedure:  Tolerated well, no immediate complications   (including critical care time)  Medications Ordered in ED Medications  lidocaine-EPINEPHrine (XYLOCAINE W/EPI) 2 %-1:200000 (PF) injection 20 mL (has no administration in time range)  sulfamethoxazole-trimethoprim (BACTRIM DS,SEPTRA DS)  800-160 MG per tablet 1 tablet (1 tablet Oral Given 12/25/17 1905)     Initial Impression / Assessment and Plan / ED Course  I have reviewed the triage vital signs and the nursing notes.  Pertinent labs & imaging results that were available during my care of the patient were reviewed by me and considered in my medical decision making (see chart for details).     Exam consistent with abscess with localized cellulitis.  Patient is immunocompetent and she has no constitutional symptoms.  Incision and drainage today with copious amounts of purulent drainage with subsequent improvement in edema.  There is no indication for emergent lab work or imaging as patient is afebrile, immunocompetent, well-appearing.  Will discharge with Bactrim, high-dose NSAIDs, warm compresses and massage.  Discussed return precautions.  Patient is in agreement.  Final Clinical Impressions(s) / ED  Diagnoses   Final diagnoses:  Abscess of axilla, right    ED Discharge Orders         Ordered    sulfamethoxazole-trimethoprim (BACTRIM DS,SEPTRA DS) 800-160 MG tablet  2 times daily,   Status:  Discontinued     12/25/17 1943    sulfamethoxazole-trimethoprim (BACTRIM DS,SEPTRA DS) 800-160 MG tablet  2 times daily     12/25/17 1949           Arlean Hopping 12/25/17 1950    Lajean Saver, MD 12/25/17 2258

## 2017-12-25 NOTE — ED Triage Notes (Signed)
Pt st;'s she has a abscess in right armpit onset 3 weeks ago

## 2017-12-25 NOTE — Discharge Instructions (Addendum)
You have a superficial abscess. This is a collection of pus.    Treatment includes antibiotics, anti-inflammatories, moist heat therapy.   Take antibiotic as prescribed and until completed. Symptoms typically improve in 48-72 hours.   Apply moist heat (warm towel, heating pad) or massage under warm water at least twice a day to help drainage.   Take [phone] mg acetaminophen or 600 mg ibuprofen every 8 hours for pain and swelling for the next 3 -5days.  Any abscess can worsen, enlarge and spread infection into blood stream.  Return to the ER if you have fevers, chills, worsening swelling, redness, warmth.

## 2017-12-27 ENCOUNTER — Other Ambulatory Visit: Payer: 59

## 2018-01-17 ENCOUNTER — Ambulatory Visit: Payer: 59

## 2018-01-19 ENCOUNTER — Ambulatory Visit: Payer: 59 | Admitting: Allergy

## 2018-02-05 ENCOUNTER — Other Ambulatory Visit: Payer: Self-pay

## 2018-02-05 ENCOUNTER — Inpatient Hospital Stay (HOSPITAL_COMMUNITY)
Admission: EM | Admit: 2018-02-05 | Discharge: 2018-02-12 | DRG: 377 | Disposition: A | Discharge status: Home / Self Care | Payer: 59 | Attending: Family Medicine | Admitting: Family Medicine

## 2018-02-05 ENCOUNTER — Emergency Department (HOSPITAL_COMMUNITY): Payer: 59

## 2018-02-05 ENCOUNTER — Encounter (HOSPITAL_COMMUNITY): Payer: Self-pay | Admitting: Emergency Medicine

## 2018-02-05 DIAGNOSIS — N390 Urinary tract infection, site not specified: Secondary | ICD-10-CM | POA: Diagnosis present

## 2018-02-05 DIAGNOSIS — E782 Mixed hyperlipidemia: Secondary | ICD-10-CM | POA: Diagnosis present

## 2018-02-05 DIAGNOSIS — J189 Pneumonia, unspecified organism: Secondary | ICD-10-CM | POA: Diagnosis not present

## 2018-02-05 DIAGNOSIS — R7989 Other specified abnormal findings of blood chemistry: Secondary | ICD-10-CM | POA: Diagnosis present

## 2018-02-05 DIAGNOSIS — B9620 Unspecified Escherichia coli [E. coli] as the cause of diseases classified elsewhere: Secondary | ICD-10-CM | POA: Diagnosis present

## 2018-02-05 DIAGNOSIS — X58XXXA Exposure to other specified factors, initial encounter: Secondary | ICD-10-CM | POA: Diagnosis present

## 2018-02-05 DIAGNOSIS — R748 Abnormal levels of other serum enzymes: Secondary | ICD-10-CM | POA: Diagnosis present

## 2018-02-05 DIAGNOSIS — D1803 Hemangioma of intra-abdominal structures: Secondary | ICD-10-CM | POA: Diagnosis present

## 2018-02-05 DIAGNOSIS — K2971 Gastritis, unspecified, with bleeding: Secondary | ICD-10-CM | POA: Diagnosis not present

## 2018-02-05 DIAGNOSIS — K254 Chronic or unspecified gastric ulcer with hemorrhage: Secondary | ICD-10-CM | POA: Diagnosis present

## 2018-02-05 DIAGNOSIS — K8301 Primary sclerosing cholangitis: Secondary | ICD-10-CM | POA: Diagnosis present

## 2018-02-05 DIAGNOSIS — I959 Hypotension, unspecified: Secondary | ICD-10-CM | POA: Diagnosis not present

## 2018-02-05 DIAGNOSIS — Z6841 Body Mass Index (BMI) 40.0 and over, adult: Secondary | ICD-10-CM

## 2018-02-05 DIAGNOSIS — D734 Cyst of spleen: Secondary | ICD-10-CM | POA: Diagnosis present

## 2018-02-05 DIAGNOSIS — K760 Fatty (change of) liver, not elsewhere classified: Secondary | ICD-10-CM | POA: Diagnosis present

## 2018-02-05 DIAGNOSIS — R945 Abnormal results of liver function studies: Secondary | ICD-10-CM

## 2018-02-05 DIAGNOSIS — R1011 Right upper quadrant pain: Secondary | ICD-10-CM

## 2018-02-05 DIAGNOSIS — E559 Vitamin D deficiency, unspecified: Secondary | ICD-10-CM | POA: Diagnosis present

## 2018-02-05 DIAGNOSIS — Z7982 Long term (current) use of aspirin: Secondary | ICD-10-CM

## 2018-02-05 DIAGNOSIS — R10811 Right upper quadrant abdominal tenderness: Secondary | ICD-10-CM

## 2018-02-05 DIAGNOSIS — R630 Anorexia: Secondary | ICD-10-CM | POA: Diagnosis present

## 2018-02-05 DIAGNOSIS — Z7951 Long term (current) use of inhaled steroids: Secondary | ICD-10-CM

## 2018-02-05 DIAGNOSIS — Z886 Allergy status to analgesic agent status: Secondary | ICD-10-CM

## 2018-02-05 DIAGNOSIS — S36119A Unspecified injury of liver, initial encounter: Secondary | ICD-10-CM | POA: Diagnosis present

## 2018-02-05 DIAGNOSIS — J45909 Unspecified asthma, uncomplicated: Secondary | ICD-10-CM | POA: Diagnosis present

## 2018-02-05 DIAGNOSIS — Z79899 Other long term (current) drug therapy: Secondary | ICD-10-CM

## 2018-02-05 DIAGNOSIS — K743 Primary biliary cirrhosis: Secondary | ICD-10-CM | POA: Diagnosis present

## 2018-02-05 DIAGNOSIS — R509 Fever, unspecified: Secondary | ICD-10-CM

## 2018-02-05 DIAGNOSIS — R109 Unspecified abdominal pain: Secondary | ICD-10-CM

## 2018-02-05 DIAGNOSIS — R7881 Bacteremia: Secondary | ICD-10-CM | POA: Diagnosis present

## 2018-02-05 DIAGNOSIS — R933 Abnormal findings on diagnostic imaging of other parts of digestive tract: Secondary | ICD-10-CM | POA: Diagnosis present

## 2018-02-05 DIAGNOSIS — E669 Obesity, unspecified: Secondary | ICD-10-CM | POA: Diagnosis present

## 2018-02-05 HISTORY — DX: Vitamin D deficiency, unspecified: E55.9

## 2018-02-05 HISTORY — DX: Abnormal results of liver function studies: R94.5

## 2018-02-05 HISTORY — DX: Gastrointestinal hemorrhage, unspecified: K92.2

## 2018-02-05 LAB — COMPREHENSIVE METABOLIC PANEL
ALK PHOS: 684 U/L — AB (ref 38–126)
ALT: 227 U/L — AB (ref 0–44)
ANION GAP: 8 (ref 5–15)
AST: 287 U/L — ABNORMAL HIGH (ref 15–41)
Albumin: 3 g/dL — ABNORMAL LOW (ref 3.5–5.0)
BUN: 7 mg/dL (ref 6–20)
CALCIUM: 9.1 mg/dL (ref 8.9–10.3)
CHLORIDE: 103 mmol/L (ref 98–111)
CO2: 27 mmol/L (ref 22–32)
Creatinine, Ser: 0.75 mg/dL (ref 0.44–1.00)
GFR calc Af Amer: >60 mL/min (ref >60)
Glucose, Bld: 111 mg/dL — ABNORMAL HIGH (ref 70–99)
Potassium: 4 mmol/L (ref 3.5–5.1)
SODIUM: 138 mmol/L (ref 135–145)
TOTAL PROTEIN: 7.6 g/dL (ref 6.5–8.1)
Total Bilirubin: 1.9 mg/dL — ABNORMAL HIGH (ref 0.3–1.2)

## 2018-02-05 LAB — I-STAT BETA HCG BLOOD, ED (MC, WL, AP ONLY)

## 2018-02-05 LAB — LIPID PANEL
Cholesterol: 292 mg/dL — ABNORMAL HIGH (ref 0–200)
HDL: 53 mg/dL (ref >40)
LDL CALC: 219 mg/dL — AB (ref 0–99)
Total CHOL/HDL Ratio: 5.5 RATIO
Triglycerides: 98 mg/dL (ref <150)
VLDL: 20 mg/dL (ref 0–40)

## 2018-02-05 LAB — URINALYSIS, ROUTINE W REFLEX MICROSCOPIC
BILIRUBIN URINE: NEGATIVE
GLUCOSE, UA: NEGATIVE mg/dL
HGB URINE DIPSTICK: NEGATIVE
KETONES UR: NEGATIVE mg/dL
NITRITE: NEGATIVE
PROTEIN: NEGATIVE mg/dL
Specific Gravity, Urine: 1.008 (ref 1.005–1.030)
pH: 8 (ref 5.0–8.0)

## 2018-02-05 LAB — CBC
HEMATOCRIT: 41.1 % (ref 36.0–46.0)
HEMOGLOBIN: 13.3 g/dL (ref 12.0–15.0)
MCH: 30.1 pg (ref 26.0–34.0)
MCHC: 32.4 g/dL (ref 30.0–36.0)
MCV: 93 fL (ref 78.0–100.0)
Platelets: 291 10*3/uL (ref 150–400)
RBC: 4.42 MIL/uL (ref 3.87–5.11)
RDW: 13.2 % (ref 11.5–15.5)
WBC: 8.3 10*3/uL (ref 4.0–10.5)

## 2018-02-05 LAB — HEMOGLOBIN A1C
HEMOGLOBIN A1C: 4.9 % (ref 4.8–5.6)
MEAN PLASMA GLUCOSE: 93.93 mg/dL

## 2018-02-05 LAB — GAMMA GT: GGT: 1157 U/L — ABNORMAL HIGH (ref 7–50)

## 2018-02-05 LAB — LIPASE, BLOOD: LIPASE: 29 U/L (ref 11–51)

## 2018-02-05 LAB — APTT: aPTT: 31 seconds (ref 24–36)

## 2018-02-05 LAB — PROTIME-INR
INR: 0.96
Prothrombin Time: 12.7 seconds (ref 11.4–15.2)

## 2018-02-05 MED ORDER — FLUTICASONE PROPIONATE HFA 220 MCG/ACT IN AERO: 2.0000 | INHALATION_SPRAY | Freq: Two times a day (BID) | RESPIRATORY_TRACT | Status: DC

## 2018-02-05 MED ORDER — ALBUTEROL SULFATE (2.5 MG/3ML) 0.083% IN NEBU: 2.5000 mg | INHALATION_SOLUTION | RESPIRATORY_TRACT | Status: DC | PRN

## 2018-02-05 MED ORDER — SODIUM CHLORIDE 0.9% FLUSH
3.0000 mL | Freq: Two times a day (BID) | INTRAVENOUS | Status: DC
  Administered 2018-02-05 – 2018-02-12 (×10): 3 mL via INTRAVENOUS

## 2018-02-05 MED ORDER — OXYCODONE HCL 5 MG PO TABS
5.0000 mg | ORAL_TABLET | ORAL | Status: DC | PRN
  Administered 2018-02-07 – 2018-02-11 (×4): 5 mg via ORAL
  Filled 2018-02-05 (×6): qty 1

## 2018-02-05 MED ORDER — ENOXAPARIN SODIUM 40 MG/0.4ML ~~LOC~~ SOLN
40.0000 mg | SUBCUTANEOUS | Status: DC
  Filled 2018-02-05 (×5): qty 0.4

## 2018-02-05 MED ORDER — AZELASTINE HCL 0.1 % NA SOLN: 2.0000 | Freq: Two times a day (BID) | NASAL | Status: DC

## 2018-02-05 MED ORDER — MONTELUKAST SODIUM 10 MG PO TABS
10.0000 mg | ORAL_TABLET | Freq: Every day | ORAL | Status: DC
  Administered 2018-02-05 – 2018-02-11 (×7): 10 mg via ORAL
  Filled 2018-02-05 (×7): qty 1

## 2018-02-05 MED ORDER — LORATADINE 10 MG PO TABS
10.0000 mg | ORAL_TABLET | Freq: Every day | ORAL | Status: DC
  Administered 2018-02-05 – 2018-02-12 (×7): 10 mg via ORAL
  Filled 2018-02-05 (×7): qty 1

## 2018-02-05 MED ORDER — LACTATED RINGERS IV SOLN
INTRAVENOUS | Status: AC
  Administered 2018-02-05: 22:00:00 via INTRAVENOUS

## 2018-02-05 MED ORDER — PANTOPRAZOLE SODIUM 40 MG IV SOLR
40.0000 mg | INTRAVENOUS | Status: DC
  Administered 2018-02-05: 40 mg via INTRAVENOUS
  Filled 2018-02-05 (×2): qty 40

## 2018-02-05 MED ORDER — ONDANSETRON HCL 4 MG/2ML IJ SOLN: 4.0000 mg | Freq: Four times a day (QID) | INTRAMUSCULAR | Status: DC | PRN

## 2018-02-05 MED ORDER — ONDANSETRON HCL 4 MG/2ML IJ SOLN
4.0000 mg | Freq: Once | INTRAMUSCULAR | Status: AC
  Administered 2018-02-05: 4 mg via INTRAVENOUS
  Filled 2018-02-05: qty 2

## 2018-02-05 MED ORDER — SODIUM CHLORIDE 0.9 % IV BOLUS
1000.0000 mL | Freq: Once | INTRAVENOUS | Status: AC
  Administered 2018-02-05: 1000 mL via INTRAVENOUS

## 2018-02-05 MED ORDER — MOMETASONE FURO-FORMOTEROL FUM 200-5 MCG/ACT IN AERO
2.0000 | INHALATION_SPRAY | Freq: Two times a day (BID) | RESPIRATORY_TRACT | Status: DC
  Administered 2018-02-06 – 2018-02-12 (×13): 2 via RESPIRATORY_TRACT
  Filled 2018-02-05 (×2): qty 8.8

## 2018-02-05 MED ORDER — LORAZEPAM 2 MG/ML IJ SOLN
0.5000 mg | Freq: Once | INTRAMUSCULAR | Status: AC | PRN
  Administered 2018-02-06: 0.5 mg via INTRAVENOUS
  Filled 2018-02-05: qty 1

## 2018-02-05 NOTE — H&P (Addendum)
History and Physical   Monique Harrison UEA:[id number] DOB: 09-06-1975 DOA: 02/05/2018  PCP: Patient, No Pcp Per  Chief Complaint: Abdominal pain  HPI: This is a 42 year old woman with medical problems including asthma, allergies, obesity, presenting with abdominal pain.  History is obtained via patient report as well as husband and daughter at the bedside.  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  Never smoker.  She reports onset of symptoms 2 weeks ago, initially epigastric pain thought to be indigestion, also pain in the right upper quadrant.  She drank baking soda plus water and had nonbilious nonbloody emesis.  Her pain and symptoms improved but recurred intermittently every few days.  Her pain lasts for hours, described as a dull, located in the epigastrium and right upper quadrant, nonradiating.  Her pain was not alleviated with Mylanta.  She reports over-the-counter medications including magnesium and vitamin D, does not report recent NSAID use.  Due to her persistent symptoms decided to seek medical attention today.  Associated symptoms include nausea.  Specifically denies constipation, fevers, chills, chest pain, shortness of breath, weight changes.  She reports she does not have a primary care physician, does report falling with a local allergy specialist.  She has not required regular albuterol rescue inhaler use.  She does report she was intubated in the 1990s for asthma exacerbation, but recently has been under better control.  No longer on interleukin directed therapy due to it not helping.  ED Course: In the emergency department vital signs were unremarkable, systolic blood pressure 478.  CBC and CMP were remarkable for AST/ALT of 287 and 227 respectively.  Alk phos of 684.  Total bilirubin 1.9.  Total protein 7.6, albumin 3.  Previous test negative.  Lipase normal.  Right upper quadrant ultrasound  revealed hepatic steatosis as well as evidence of benign hemangiomas.  Emergency medicine team discussed case with gastroenterology consult service who recommended MRCP and MRI of the abdomen, and admission for further management.  Hospital medicine consulted for further management.  PTT and PT/INR normal.  Review of Systems: A complete ROS was obtained; pertinent positives negatives are denoted in the HPI. Otherwise, all systems are negative.   Past Medical History:  Diagnosis Date  . Abnormal LFTs 2005  . Asthma   . Recurrent upper respiratory infection (URI)   . Upper GI bleed 2016   [place], [place]; presented with melena; admitted for 1 week; thought to be due to frequent Naproxen use   Social History   Socioeconomic History  . Marital status: Divorced    Spouse name: Not on file  . Number of children: Not on file  . Years of education: Not on file  . Highest education level: Not on file  Occupational History  . Not on file  Social Needs  . Financial resource strain: Not on file  . Food insecurity:    Worry: Not on file    Inability: Not on file  . Transportation needs:    Medical: Not on file    Non-medical: Not on file  Tobacco Use  . Smoking status: Never Smoker  . Smokeless tobacco: Never Used  Substance and Sexual Activity  . Alcohol use: No  . Drug use: No  . Sexual activity: Yes  Lifestyle  . Physical activity:    Days per week: Not on file    Minutes per session: Not on file  . Stress:  Not on file  Relationships  . Social connections:    Talks on phone: Not on file    Gets together: Not on file    Attends religious service: Not on file    Active member of club or organization: Not on file    Attends meetings of clubs or organizations: Not on file    Relationship status: Not on file  . Intimate partner violence:    Fear of current or ex partner: Not on file    Emotionally abused: Not on file    Physically abused: Not on file    Forced sexual activity:  Not on file  Other Topics Concern  . Not on file  Social History Narrative  . Not on file   Family History  Problem Relation Age of Onset  . Allergic rhinitis Father   . Breast cancer Maternal Grandmother     Physical Exam: Vitals:   02/05/18 1346 02/05/18 1600 02/05/18 1615  BP: 121/81 106/76 101/74  Pulse: 73 77 71  Resp: 18    Temp: 98.8 F (37.1 C)    TempSrc: Oral    SpO2: 97% 97% 98%   General: Appears calm and comfortable obese black woman ENT: Grossly normal hearing, MMM. Xanthelasmas present b/l. Cardiovascular: RRR. No M/R/G. No LE edema.  Respiratory: CTA bilaterally with exception of scattered intermittent wheezes (patient reports this is chronic).  Normal respiratory effort. Breathing room air. Abdomen: Soft, tender to palpation in epigastrium and RUQ (more so in RUQ) Skin: No rash or induration seen on limited exam. Multiple tattoos. Musculoskeletal: Grossly normal tone BUE/BLE. Appropriate ROM. Sits up without difficulty. Psychiatric: Grossly normal mood and affect. Neurologic: Moves all extremities in coordinated fashion.  I have personally reviewed the following labs, culture data, and imaging studies.  Assessment/Plan:  #Abdominal pain with cholestatic (primarily elevated AP) liver injury Course: presents with multi-week hx of abdominal pain aggravated by PO intake, found to have hepatic steatosis on imaging, AP of 684, AST/ALT in 200s, T bili of 1.9. A/P: no frank evidence of biliary dilation on [place] in ED.  Epigastric pain could be c/w pancreatitis (possible TG induced given xanthelasmas b/l), however, normal lipase does not suggest this. Other considerations include primary biliary cirrhosis, hepatitis, hepatic steatosis?, vs other.  Will obtain GGT to ensure elevated AP is related to liver.  Obtain antimitochondrial antibody.  MRCP and MRI of the liver ordered by emergency medicine team after discussion with GI consult service who will evaluate the patient  in the AM. NPO, PPI for acid suppression, IVF support (LR at 100 cc x 10 hr then re-assess), as needed pain (opioid) and nausea (ondansetron) for symptomatic control.   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, A1c -Chronic allergies: continue home medications -Asthma: not in exacerbation, has chronic wheezing at baseline, on room air, continue Advair equivalent while in house, bronchodilators PRN -Liver lesions: f/u MRI reading, may need additional f/u imaging; [place] c/w possible hemangiomas  DVT prophylaxis: Subq Lovenox Code Status: Do not perform CPR / defibrillation, would like intubation if respiratory failure, this was confirmed with the patient on day of admission Disposition Plan: Anticipate D/C home as early as tomorrow pending clinical course Consults called: GI consult service consulted in emergency department Admission status: admit to hospital medicine service   Cheri Rous, MD [facility] [phone]  If 7PM-7AM, please contact night-coverage [email or web address] Password [email or web address]  This document was created using the aid  of voice recognition / dication software.

## 2018-02-05 NOTE — ED Provider Notes (Signed)
[facility] EMERGENCY DEPARTMENT Provider Note   CSN: [id number] Arrival date & time: 02/05/18  1338     History   Chief Complaint Chief Complaint  Patient presents with  . Abdominal Pain    HPI Monique Harrison is a 42 y.o. female.  HPI   Monique Harrison is a 42 y.o. female, with a history of asthma, presenting to the ED with abdominal pain beginning last week. Pain is epigastric, states she thinks it's indigestion, "feels like a ball or like something is stuck in there," nonradiating, 5/10, was intermittent, but constant since yesterday. Nausea and diarrhea starting today with two loose stools.  Was occurring with eating, resolving with mylanta, but then recurring within about 10 minutes of eating.   Denies alcohol, tobacco, or regular NSAID use.  Denies fever/chills, vomiting, hematochezia/melena, CP, SOB, or any other complaints.    Additional pertinent history components: Last labs were done by [facility]. Next appt is Oct 8 with PA Florene Glen.  Excerpt of labs performed by patient's OBGYN on 12/29/17: BUN 14 AST 96 ALT 93 Alk phos 722 Total bili 0.6 Creatinine 1.14 Albumin 3.8 Hepatitis panel was normal  Has had work up for elevated liver enzymes first noted in 2005.  States she had abdominal MRI performed in 2012, which was normal.  Additionally states she had a upper GI bleed with melena, for which she was hospitalized in [place] in 2016.   Past Medical History:  Diagnosis Date  . Abnormal LFTs 2005  . Asthma   . Recurrent upper respiratory infection (URI)   . Upper GI bleed 2016   [place], [place]; presented with melena; admitted for 1 week; thought to be due to frequent Naproxen use    Patient Active Problem List   Diagnosis Date Noted  . Abdominal pain 02/05/2018    Past Surgical History:  Procedure Laterality Date  . NO PAST SURGERIES       OB History   None      Home Medications    Prior to  Admission medications   Medication Sig Start Date End Date Taking? Authorizing Provider  albuterol (PROVENTIL HFA;VENTOLIN HFA) 108 (90 Base) MCG/ACT inhaler Inhale 2 puffs into the lungs every 4 (four) hours as needed for wheezing or shortness of breath. 08/29/17   Padgett, Rae Halsted, MD  albuterol (PROVENTIL) (2.5 MG/3ML) 0.083% nebulizer solution Take 3 mLs (2.5 mg total) by nebulization every 4 (four) hours as needed for wheezing or shortness of breath. 08/29/17   Padgett, Rae Halsted, MD  azelastine (ASTELIN) 0.1 % nasal spray Place 2 sprays into both nostrils 2 (two) times daily. 08/29/17   Kennith Gain, MD  cetirizine (ZYRTEC ALLERGY) 10 MG tablet Take 1 tablet (10 mg total) by mouth daily. 02/21/17   Zigmund Gottron, NP  fluticasone (FLOVENT HFA) 220 MCG/ACT inhaler Inhale 2 puffs into the lungs 2 (two) times daily. 08/29/17   Kennith Gain, MD  Fluticasone-Salmeterol (ADVAIR) 500-50 MCG/DOSE AEPB Inhale 1 puff into the lungs 2 (two) times daily. 08/29/17   Kennith Gain, MD  montelukast (SINGULAIR) 10 MG tablet Take 1 tablet (10 mg total) by mouth at bedtime. 08/29/17   Kennith Gain, MD    Family History Family History  Problem Relation Age of Onset  . Allergic rhinitis Father   . Breast cancer Maternal Grandmother     Social History Social History   Tobacco Use  . Smoking status: Never Smoker  . Smokeless  tobacco: Never Used  Substance Use Topics  . Alcohol use: No  . Drug use: No     Allergies   Aspirin   Review of Systems Review of Systems  Constitutional: Negative for chills, diaphoresis and fever.  Respiratory: Negative for shortness of breath.   Cardiovascular: Negative for chest pain.  Gastrointestinal: Positive for abdominal pain, diarrhea and nausea. Negative for blood in stool and vomiting.  Genitourinary: Negative for dysuria, flank pain, frequency and hematuria.  Musculoskeletal: Negative for back  pain.  All other systems reviewed and are negative.    Physical Exam Updated Vital Signs BP 121/81   Pulse 73   Temp 98.8 F (37.1 C) (Oral)   Resp 18   SpO2 97%   Physical Exam  Constitutional: She appears well-developed and well-nourished. No distress.  HENT:  Head: Normocephalic and atraumatic.  Eyes: Conjunctivae are normal.  Neck: Neck supple.  Cardiovascular: Normal rate, regular rhythm, normal heart sounds and intact distal pulses.  Pulmonary/Chest: Effort normal and breath sounds normal. No respiratory distress.  Abdominal: Soft. There is tenderness in the right upper quadrant and epigastric area. There is positive Murphy's sign. There is no guarding.  Patient is exquisitely tender in the right upper quadrant.  Musculoskeletal: She exhibits no edema.  Lymphadenopathy:    She has no cervical adenopathy.  Neurological: She is alert.  Skin: Skin is warm and dry. She is not diaphoretic.  Psychiatric: She has a normal mood and affect. Her behavior is normal.  Nursing note and vitals reviewed.    ED Treatments / Results  Labs (all labs ordered are listed, but only abnormal results are displayed) Labs Reviewed  COMPREHENSIVE METABOLIC PANEL - Abnormal; Notable for the following components:      Result Value   Glucose, Bld 111 (*)    Albumin 3.0 (*)    AST 287 (*)    ALT 227 (*)    Alkaline Phosphatase 684 (*)    Total Bilirubin 1.9 (*)    All other components within normal limits  URINALYSIS, ROUTINE W REFLEX MICROSCOPIC - Abnormal; Notable for the following components:   Leukocytes, UA MODERATE (*)    Bacteria, UA FEW (*)    All other components within normal limits  LIPASE, BLOOD  CBC  PROTIME-INR  APTT  HEPATITIS PANEL, ACUTE  I-STAT BETA HCG BLOOD, ED ([facility], [facility], [facility] ONLY)    EKG None  Radiology US Abdomen Limited Ruq  Result Date: 02/05/2018 CLINICAL DATA:  Right upper quadrant tenderness EXAM: ULTRASOUND ABDOMEN LIMITED RIGHT UPPER QUADRANT  COMPARISON:  None. FINDINGS: Gallbladder: No gallstones, gallbladder wall thickening, or pericholecystic fluid. Negative sonographic Murphy's sign. Common bile duct: Diameter: 6 mm Liver: Hyperechoic hepatic parenchyma, suggesting hepatic steatosis. 2.0 x 2.0 x 1.9 cm hyperechoic lesion in the central right hepatic lobe (image 38). Additional 2.0 x 2.8 x 2.4 cm vague hyperechoic lesion in the right hepatic lobe. Portal vein is patent on color Doppler imaging with normal direction of blood flow towards the liver. IMPRESSION: Suspected hepatic steatosis. Two hyperechoic hepatic lesions, likely reflecting benign hemangiomas, although incompletely characterized. Consider follow-up MRI abdomen with/without contrast in 3 months. Electronically Signed   By: Julian Hy M.D.   On: 02/05/2018 17:40    Procedures Procedures (including critical care time)  Medications Ordered in ED Medications  pantoprazole (PROTONIX) injection 40 mg (has no administration in time range)  sodium chloride 0.9 % bolus 1,000 mL (0 mLs Intravenous Stopped 02/05/18 1839)  ondansetron (ZOFRAN) injection 4  mg (4 mg Intravenous Given 02/05/18 1630)     Initial Impression / Assessment and Plan / ED Course  I have reviewed the triage vital signs and the nursing notes.  Pertinent labs & imaging results that were available during my care of the patient were reviewed by me and considered in my medical decision making (see chart for details).  Clinical Course as of Feb 06 1932  Sun Feb 05, 2018  1610 Declines analgesia at this time.    [SJ]  1812 Spoke with Dr. Hilarie Fredrickson, [facility]. Agrees with admission. They will come see the patient in the morning.  Recommends MRI abdomen with contrast liver protocol and MRCP.  Acute hepatitis panel.  Start patient on IV PPI, such as 40 mg Protonix, daily.    [SJ]  1815 Spoke with Dr. Stana Bunting, hospitalist. Agrees to admit the patient.    [SJ]    Clinical Course User Index [SJ] Joy, Shawn  C, PA-C    Patient presents with epigastric pain and right upper quadrant tenderness.  Elevated liver function tests, even beyond her baseline.  Dyspepsia and biliary colic on the differential. No definite acute abnormalities noted on right upper quadrant ultrasound.  However, due to the patient's amount of tenderness as well as her level of LFT elevation, admission and further testing is warranted.   Findings and plan of care discussed with Dene Gentry, MD.    Vitals:   02/05/18 1346 02/05/18 1600 02/05/18 1615  BP: 121/81 106/76 101/74  Pulse: 73 77 71  Resp: 18    Temp: 98.8 F (37.1 C)    TempSrc: Oral    SpO2: 97% 97% 98%     Final Clinical Impressions(s) / ED Diagnoses   Final diagnoses:  RUQ abdominal tenderness  Elevated LFTs    ED Discharge Orders    None       Layla Maw 02/05/18 1933    Valarie Merino, MD 02/05/18 2207

## 2018-02-05 NOTE — ED Triage Notes (Signed)
Pt report pain in upper abdomen after she eats or drinks, states it feels like something is stuck there. Pain started last week. She has been taking ex lax and mylanta without relief.

## 2018-02-06 ENCOUNTER — Encounter (HOSPITAL_COMMUNITY): Payer: Self-pay | Admitting: Physician Assistant

## 2018-02-06 ENCOUNTER — Observation Stay (HOSPITAL_COMMUNITY): Payer: 59

## 2018-02-06 DIAGNOSIS — R935 Abnormal findings on diagnostic imaging of other abdominal regions, including retroperitoneum: Secondary | ICD-10-CM

## 2018-02-06 DIAGNOSIS — R748 Abnormal levels of other serum enzymes: Secondary | ICD-10-CM | POA: Diagnosis not present

## 2018-02-06 DIAGNOSIS — K8301 Primary sclerosing cholangitis: Secondary | ICD-10-CM | POA: Diagnosis present

## 2018-02-06 DIAGNOSIS — K8309 Other cholangitis: Secondary | ICD-10-CM

## 2018-02-06 DIAGNOSIS — R1011 Right upper quadrant pain: Secondary | ICD-10-CM | POA: Diagnosis not present

## 2018-02-06 LAB — CBC WITH DIFFERENTIAL/PLATELET
Abs Immature Granulocytes: 0 10*3/uL (ref 0.0–0.1)
Basophils Absolute: 0.1 10*3/uL (ref 0.0–0.1)
Basophils Relative: 1 %
Eosinophils Absolute: 0.8 10*3/uL — ABNORMAL HIGH (ref 0.0–0.7)
Eosinophils Relative: 10 %
HEMATOCRIT: 41.7 % (ref 36.0–46.0)
Hemoglobin: 13.5 g/dL (ref 12.0–15.0)
IMMATURE GRANULOCYTES: 0 %
LYMPHS ABS: 2.3 10*3/uL (ref 0.7–4.0)
Lymphocytes Relative: 26 %
MCH: 30.1 pg (ref 26.0–34.0)
MCHC: 32.4 g/dL (ref 30.0–36.0)
MCV: 93.1 fL (ref 78.0–100.0)
MONOS PCT: 6 %
Monocytes Absolute: 0.5 10*3/uL (ref 0.1–1.0)
NEUTROS PCT: 57 %
Neutro Abs: 5 10*3/uL (ref 1.7–7.7)
Platelets: 293 10*3/uL (ref 150–400)
RBC: 4.48 MIL/uL (ref 3.87–5.11)
RDW: 13.4 % (ref 11.5–15.5)
WBC: 8.7 10*3/uL (ref 4.0–10.5)

## 2018-02-06 LAB — COMPREHENSIVE METABOLIC PANEL
ALBUMIN: 3 g/dL — AB (ref 3.5–5.0)
ALT: 243 U/L — ABNORMAL HIGH (ref 0–44)
AST: 244 U/L — AB (ref 15–41)
Alkaline Phosphatase: 727 U/L — ABNORMAL HIGH (ref 38–126)
Anion gap: 5 (ref 5–15)
BUN: 5 mg/dL — AB (ref 6–20)
CHLORIDE: 106 mmol/L (ref 98–111)
CO2: 24 mmol/L (ref 22–32)
Calcium: 8.4 mg/dL — ABNORMAL LOW (ref 8.9–10.3)
Creatinine, Ser: 0.73 mg/dL (ref 0.44–1.00)
GFR calc non Af Amer: >60 mL/min (ref >60)
Glucose, Bld: 84 mg/dL (ref 70–99)
Potassium: 4.4 mmol/L (ref 3.5–5.1)
SODIUM: 135 mmol/L (ref 135–145)
Total Bilirubin: 1.6 mg/dL — ABNORMAL HIGH (ref 0.3–1.2)
Total Protein: 7.8 g/dL (ref 6.5–8.1)

## 2018-02-06 LAB — HIV ANTIBODY (ROUTINE TESTING W REFLEX): HIV SCREEN 4TH GENERATION: NONREACTIVE

## 2018-02-06 MED ORDER — IOPAMIDOL (ISOVUE-370) INJECTION 76%
100.0000 mL | Freq: Once | INTRAVENOUS | Status: AC | PRN
  Administered 2018-02-06: 100 mL via INTRAVENOUS

## 2018-02-06 MED ORDER — GADOBUTROL 1 MMOL/ML IV SOLN
10.0000 mL | Freq: Once | INTRAVENOUS | Status: AC | PRN
  Administered 2018-02-06: 10 mL via INTRAVENOUS

## 2018-02-06 MED ORDER — IOPAMIDOL (ISOVUE-370) INJECTION 76%
INTRAVENOUS | Status: AC
  Filled 2018-02-06: qty 100

## 2018-02-06 MED ORDER — PANTOPRAZOLE SODIUM 40 MG PO TBEC
40.0000 mg | DELAYED_RELEASE_TABLET | Freq: Every day | ORAL | Status: DC
  Administered 2018-02-07: 40 mg via ORAL
  Filled 2018-02-06: qty 1

## 2018-02-06 NOTE — Consult Note (Signed)
[facility] Consult: 4:49 PM 02/06/2018  LOS: 0 days    Referring Provider: Dr. Evangeline Gula Primary Care Physician:  Patient, No Pcp Per Primary Gastroenterologist: Patient is unassigned    Reason for Consultation: Normal LFTs and abnormal MRCP   HPI: Monique Harrison is a 42 y.o. female.  Hx GI bleed in 2016.  Bleeding attributed to frequent Naprosyn though pt recalls no lesions on EGD or colonoscopy in [place].  History of abnormal LFTs in the mid to thousands, could have been 2005 or so.  She was referred to a GI doctor who obtained multiple blood tests and performed MRI, possibly MRCP, after that she had no further testing.  She does not recall being told the etiology of her abnormal LFTs but they have been persistently elevated for many years.  Asthma.  Early last week she had some vague, moderate pain in the left flank.  Around Wednesday after eating she developed acute epigastric pain with nausea, she felt it was heartburn.  She took some baking soda mixed with water and the symptoms passed after about 3 hours.  The same pain recurred on Friday after eating, while she was at work and she got Mylanta and Alka-Seltzer.  The symptoms passed in few hours.  Saturday when she woke up the pain was still there and it was intermittent all day long.  It was still there on Sunday so she came to the emergency department.  The pain is primarily in the epigastrium but does not radiate.  On exam she has tenderness both there and in the right upper quadrant.  As long as she does not eat she does not have the pain.  Although there is been some intermittent nausea, she has not vomited.  Pain triggered by trying to take a deep breath.  Does like there is a bubble in her epigastrium. This 400 to 600 mg of ibuprofen daily a few  times every month for sinus congestion and headaches.  Normally has a good appetite with stable weight and no issues with food consumption.  Lipase 29.  T bili 1.9.  Alkaline phosphatase 727.  AST/ALT 287/227.  GGT 1,157. WBCs 12.9.  Hgb 13.3.  MCV 94.  Platelets 304K.   HIV screening nonreactive. Abdominal ultrasound:   Hepatic steatosis.  2 hyperechoic hepatic lesions, likely reflect benign hemangiomas although incompletely characterized.  Consider MRI abdomen in 3 months.  CBD 6 mm.  Portal vein Doppler studies normal MRCP:   Somewhat motion degraded exam.  Diffusely abnormal liver, particularly on the left.  Changes favoring intrahepatic biliary ductal dilatation with numerous ductal calculi findings of which are suspicious for recurrent pyogenic cholangiohepatitis versus primary sclerosing cholangitis versus HIV cholangiopathy.  Region difficult to visualize and liver protocol CT may be informative.  Patient works as a Product/process development scientist, she is a Education officer, museum.  She does not drink alcohol. Family history negative for autoimmune disease.  Past Medical History:  Diagnosis Date  . Abnormal LFTs 2005  . Asthma   . Recurrent upper respiratory infection (URI)   . Upper  GI bleed 2016   [place]; presented with melena; admitted for 1 week; thought to be due to frequent Naproxen use  . Vitamin D deficiency     Past Surgical History:  Procedure Laterality Date  . NO PAST SURGERIES      Prior to Admission medications   Medication Sig Start Date End Date Taking? Authorizing Provider  albuterol (PROVENTIL HFA;VENTOLIN HFA) 108 (90 Base) MCG/ACT inhaler Inhale 2 puffs into the lungs every 4 (four) hours as needed for wheezing or shortness of breath. 08/29/17  Yes Padgett, Rae Halsted, MD  albuterol (PROVENTIL) (2.5 MG/3ML) 0.083% nebulizer solution Take 3 mLs (2.5 mg total) by nebulization every 4 (four) hours as needed for wheezing or shortness of breath. 08/29/17  Yes Padgett, Rae Halsted, MD  aspirin EC 325 MG tablet Take 325 mg by mouth daily as needed (pain/headache).   Yes [provider]  cetirizine-pseudoephedrine (ZYRTEC-D) 5-120 MG tablet Take 1 tablet by mouth See admin instructions. Take 1 tablet of either Zyrtec-D or Allegra-D by mouth twice daily   Yes [provider]  Cholecalciferol (VITAMIN D3) 3000 units TABS Take 3,000 Units by mouth daily.   Yes [provider]  fexofenadine-pseudoephedrine (ALLEGRA-D) 60-120 MG 12 hr tablet Take 1 tablet by mouth See admin instructions. Take 1 tablet of either Allegra-D or Zyrtec-D by mouth twice daily   Yes [provider]  Fluticasone-Salmeterol (ADVAIR) 500-50 MCG/DOSE AEPB Inhale 1 puff into the lungs 2 (two) times daily. 08/29/17  Yes Kennith Gain, MD  MAGNESIUM PO Take 1 tablet by mouth daily.   Yes [provider]  montelukast (SINGULAIR) 10 MG tablet Take 1 tablet (10 mg total) by mouth at bedtime. 08/29/17  Yes Padgett, Rae Halsted, MD  azelastine (ASTELIN) 0.1 % nasal spray Place 2 sprays into both nostrils 2 (two) times daily. Patient not taking: Reported on 02/05/2018 08/29/17   Kennith Gain, MD  cetirizine (ZYRTEC ALLERGY) 10 MG tablet Take 1 tablet (10 mg total) by mouth daily. Patient not taking: Reported on 02/05/2018 02/21/17   Zigmund Gottron, NP  fluticasone (FLOVENT HFA) 220 MCG/ACT inhaler Inhale 2 puffs into the lungs 2 (two) times daily. Patient not taking: Reported on 02/05/2018 08/29/17   Kennith Gain, MD    Scheduled Meds: . enoxaparin (LOVENOX) injection  40 mg Subcutaneous Q24H  . loratadine  10 mg Oral Daily  . mometasone-formoterol  2 puff Inhalation BID  . montelukast  10 mg Oral QHS  . [START ON 02/07/2018] pantoprazole  40 mg Oral Q0600  . sodium chloride flush  3 mL Intravenous Q12H   Infusions:  PRN Meds: albuterol, ondansetron (ZOFRAN) IV, oxyCODONE   Allergies as of 02/05/2018 - Review  Complete 02/05/2018  Allergen Reaction Noted  . Aspirin Shortness Of Breath 02/01/2017    Family History  Problem Relation Age of Onset  . Allergic rhinitis Father   . Breast cancer Maternal Grandmother     Social History   Socioeconomic History  . Marital status: Divorced    Spouse name: Not on file  . Number of children: Not on file  . Years of education: Not on file  . Highest education level: Not on file  Occupational History  . Not on file  Social Needs  . Financial resource strain: Not on file  . Food insecurity:    Worry: Not on file    Inability: Not on file  . Transportation needs:    Medical: Not on file  Non-medical: Not on file  Tobacco Use  . Smoking status: Never Smoker  . Smokeless tobacco: Never Used  Substance and Sexual Activity  . Alcohol use: No  . Drug use: No  . Sexual activity: Yes  Lifestyle  . Physical activity:    Days per week: Not on file    Minutes per session: Not on file  . Stress: Not on file  Relationships  . Social connections:    Talks on phone: Not on file    Gets together: Not on file    Attends religious service: Not on file    Active member of club or organization: Not on file    Attends meetings of clubs or organizations: Not on file    Relationship status: Not on file  . Intimate partner violence:    Fear of current or ex partner: Not on file    Emotionally abused: Not on file    Physically abused: Not on file    Forced sexual activity: Not on file  Other Topics Concern  . Not on file  Social History Narrative  . Not on file    REVIEW OF SYSTEMS: Constitutional: Patient feels a bit tired but has no profound fatigue. ENT:  No nose bleeds Pulm: No recent asthma attacks.  Stable cough, stable, non-severe dyspnea with exertion. CV:  No palpitations, no LE edema.  Chest pain GU:  No hematuria, no frequency.  No amber-colored urine. GI: Per HPI. Heme: Usual bleeding or bruising.  Acholic stools. Transfusions:  None. Neuro:  No headaches, no peripheral tingling or numbness Derm: Does have itching in her palms.  Never had jaundice that she is aware of. No itching, no rash or sores.  Patient has professionally applied tattoos starting in 1996, the last tattoo was in 2016. Endocrine:  No sweats or chills.  No polyuria or dysuria Immunization: Not queried. Travel:  None beyond local counties in last few months.    PHYSICAL EXAM: Vital signs in last 24 hours: Vitals:   02/06/18 1510 02/06/18 1554  BP: 109/82   Pulse: 80   Resp: 16   Temp: 99.6 F (37.6 C) 98.8 F (37.1 C)  SpO2: 96%    Wt Readings from Last 3 Encounters:  02/05/18 120.2 kg  12/25/17 124.7 kg  05/13/17 122 kg    General: Obese, comfortable, well-appearing AAF Head: Facial asymmetry or swelling.  No signs of head trauma. Eyes: Lateral icterus.  No conjunctival pallor.  Xanthelasmas bil on eyelids.   Ears: Not hard of hearing Nose: It is congestion, no drainage. Mouth: Moist, clear, pink oropharynx.  Tongue midline.  Good dentition. Neck: JVD, no thyromegaly, no masses. Lungs: No labored breathing.  Slight loose sounding cough.  Intermittent bilateral wheezing. Heart: RRR.  No MRG.  S1, S2 present. Abdomen: Soft.  Tenderness in the epigastrium and right upper quadrant.  No guarding or rebound.  No HSM, masses, bruits, hernias..   Rectal: Deferred Musc/Skeltl: No significant joint deformities.  No joint erythema or swelling. Extremities: CCE. Neurologic: Alert.  Oriented x3.  No tremors or asterixis.  No limb weakness. Skin: Obvious jaundice. Tattoos: Several, on her left wrist, left upper chest and back.  All professional quality. Psych: Does not, well spoken, cooperative.    Intake/Output from previous day: 10/06 0701 - 10/07 0700 In: 574.1 [I.V.:574.1] Out: -  Intake/Output this shift: No intake/output data recorded.  LAB RESULTS: Recent Labs    02/05/18 1401 02/06/18 0649  WBC 8.3 8.7  HGB 13.3  13.5  HCT  41.1 41.7  PLT 291 293   BMET Lab Results  Component Value Date   NA 135 02/06/2018   NA 138 02/05/2018   K 4.4 02/06/2018   K 4.0 02/05/2018   CL 106 02/06/2018   CL 103 02/05/2018   CO2 24 02/06/2018   CO2 27 02/05/2018   GLUCOSE 84 02/06/2018   GLUCOSE 111 (H) 02/05/2018   BUN 5 (L) 02/06/2018   BUN 7 02/05/2018   CREATININE 0.73 02/06/2018   CREATININE 0.75 02/05/2018   CALCIUM 8.4 (L) 02/06/2018   CALCIUM 9.1 02/05/2018   LFT Recent Labs    02/05/18 1401 02/06/18 0649  PROT 7.6 7.8  ALBUMIN 3.0* 3.0*  AST 287* 244*  ALT 227* 243*  ALKPHOS 684* 727*  BILITOT 1.9* 1.6*   PT/INR Lab Results  Component Value Date   INR 0.96 02/05/2018   Hepatitis Panel No results for input(s): HEPBSAG, HCVAB, HEPAIGM, HEPBIGM in the last 72 hours. C-Diff No components found for: CDIFF Lipase     Component Value Date/Time   LIPASE 29 02/05/2018 1401    Drugs of Abuse  No results found for: LABOPIA, COCAINSCRNUR, LABBENZ, AMPHETMU, THCU, LABBARB   RADIOLOGY STUDIES: Mr 3d Recon At Scanner  Result Date: 02/06/2018 CLINICAL DATA:  Abnormal liver function tests.  Abdominal pain. EXAM: MRI ABDOMEN WITHOUT AND WITH CONTRAST (INCLUDING MRCP) TECHNIQUE: Multiplanar multisequence MR imaging of the abdomen was performed both before and after the administration of intravenous contrast. Heavily T2-weighted images of the biliary and pancreatic ducts were obtained, and three-dimensional MRCP images were rendered by post processing. CONTRAST:  10 cc Gadavist COMPARISON:  02/05/2018 abdominal ultrasound. This demonstrated hyperechoic liver lesions. FINDINGS: Portions of exam are mild to moderately motion degraded. Lower chest: Normal heart size without pericardial or pleural effusion. Hepatobiliary: Abnormal appearance of the liver, with central areas of tubular mixed T2 signal, highly suspicious for dilated intrahepatic ducts with ductal calculi. Example images 14/8, 13/4, 10/14, and 44/12.  These are most conspicuous within the left hepatic lobe. Somewhat more masslike peripheral lateral segment left liver lobe T2 hyperintensity, including on image 15/4, measures 1.7 cm and is favored to represent focal intrahepatic duct dilatation. Similarly, T2 hypointensity at 1.8 cm in the right hepatic lobe on 16/8 is favored to be related to focal intrahepatic duct dilatation and intrahepatic ductal stones within. No gallstones or common duct dilatation. The common duct is upper normal in size for age at 7 mm. Pancreas:  Normal, without mass or ductal dilatation. Spleen: Nonenhancing anterior splenic lesion measures 8.0 x 6.1 cm on image 12/4. Peripheral T2 hypointensity with heterogeneous central T2 signal. No post-contrast enhancement. Smaller nonspecific T2 hyperintense posterior splenic lesions. No splenomegaly. Adrenals/Urinary Tract: Normal adrenal glands. Normal kidneys, without hydronephrosis. Stomach/Bowel: Grossly normal stomach and abdominal bowel loops. Vascular/Lymphatic: Normal caliber of the aorta and branch vessels. Prominent porta hepatis nodes are likely reactive, not pathologic by size criteria. Example 11 mm. Other:  No ascites. Musculoskeletal: No acute osseous abnormality. IMPRESSION: 1. Portions of exam are mild to moderately motion degraded. 2. Diffusely abnormal appearance of the liver, worse on the left. Favored to represent intrahepatic biliary duct dilatation with numerous ductal calculi. 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 Differential considerations include primary sclerosing cholangitis, HIV cholangiopathy. Given the difficulty with visualization of intrahepatic calculi at MRI, and motion on the current exam, liver protocol CT may be informative. 3. Heterogeneous splenic lesion is favored to represent the sequelae of  prior trauma (chronic hematoma) or infection. Recommend attention on follow-up. 4. Mildly prominent porta hepatis nodes,  favored to be reactive. These results will be called to the ordering clinician or representative by the Radiologist Assistant, and communication documented in the PACS or zVision Dashboard. Electronically Signed   By: Abigail Miyamoto M.D.   On: 02/06/2018 14:03   Mr Abdomen Mrcp Moise Boring Contast  Result Date: 02/06/2018 CLINICAL DATA:  Abnormal liver function tests.  Abdominal pain. EXAM: MRI ABDOMEN WITHOUT AND WITH CONTRAST (INCLUDING MRCP) TECHNIQUE: Multiplanar multisequence MR imaging of the abdomen was performed both before and after the administration of intravenous contrast. Heavily T2-weighted images of the biliary and pancreatic ducts were obtained, and three-dimensional MRCP images were rendered by post processing. CONTRAST:  10 cc Gadavist COMPARISON:  02/05/2018 abdominal ultrasound. This demonstrated hyperechoic liver lesions. FINDINGS: Portions of exam are mild to moderately motion degraded. Lower chest: Normal heart size without pericardial or pleural effusion. Hepatobiliary: Abnormal appearance of the liver, with central areas of tubular mixed T2 signal, highly suspicious for dilated intrahepatic ducts with ductal calculi. Example images 14/8, 13/4, 10/14, and 44/12. These are most conspicuous within the left hepatic lobe. Somewhat more masslike peripheral lateral segment left liver lobe T2 hyperintensity, including on image 15/4, measures 1.7 cm and is favored to represent focal intrahepatic duct dilatation. Similarly, T2 hypointensity at 1.8 cm in the right hepatic lobe on 16/8 is favored to be related to focal intrahepatic duct dilatation and intrahepatic ductal stones within. No gallstones or common duct dilatation. The common duct is upper normal in size for age at 7 mm. Pancreas:  Normal, without mass or ductal dilatation. Spleen: Nonenhancing anterior splenic lesion measures 8.0 x 6.1 cm on image 12/4. Peripheral T2 hypointensity with heterogeneous central T2 signal. No post-contrast  enhancement. Smaller nonspecific T2 hyperintense posterior splenic lesions. No splenomegaly. Adrenals/Urinary Tract: Normal adrenal glands. Normal kidneys, without hydronephrosis. Stomach/Bowel: Grossly normal stomach and abdominal bowel loops. Vascular/Lymphatic: Normal caliber of the aorta and branch vessels. Prominent porta hepatis nodes are likely reactive, not pathologic by size criteria. Example 11 mm. Other:  No ascites. Musculoskeletal: No acute osseous abnormality. IMPRESSION: 1. Portions of exam are mild to moderately motion degraded. 2. Diffusely abnormal appearance of the liver, worse on the left. Favored to represent intrahepatic biliary duct dilatation with numerous ductal calculi. 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 Differential considerations include primary sclerosing cholangitis, HIV cholangiopathy. Given the difficulty with visualization of intrahepatic calculi at MRI, and motion on the current exam, liver protocol CT may be informative. 3. Heterogeneous splenic lesion is favored to represent the sequelae of prior trauma (chronic hematoma) or infection. Recommend attention on follow-up. 4. Mildly prominent porta hepatis nodes, favored to be reactive. These results will be called to the ordering clinician or representative by the Radiologist Assistant, and communication documented in the PACS or zVision Dashboard. Electronically Signed   By: Abigail Miyamoto M.D.   On: 02/06/2018 14:03   US Abdomen Limited Ruq  Result Date: 02/05/2018 CLINICAL DATA:  Right upper quadrant tenderness EXAM: ULTRASOUND ABDOMEN LIMITED RIGHT UPPER QUADRANT COMPARISON:  None. FINDINGS: Gallbladder: No gallstones, gallbladder wall thickening, or pericholecystic fluid. Negative sonographic Murphy's sign. Common bile duct: Diameter: 6 mm Liver: Hyperechoic hepatic parenchyma, suggesting hepatic steatosis. 2.0 x 2.0 x 1.9 cm hyperechoic lesion in the central right hepatic lobe  (image 38). Additional 2.0 x 2.8 x 2.4 cm vague hyperechoic lesion in the right hepatic lobe. Portal vein is patent  on color Doppler imaging with normal direction of blood flow towards the liver. IMPRESSION: Suspected hepatic steatosis. Two hyperechoic hepatic lesions, likely reflecting benign hemangiomas, although incompletely characterized. Consider follow-up MRI abdomen with/without contrast in 3 months. Electronically Signed   By: Julian Hy M.D.   On: 02/05/2018 17:40     IMPRESSION:   *   Acute epigastric abdominal pain, possibly related to ulcer disease or GERD or the abnormal liver findings  *   Chronically elevated LFTs for several years. MRCP showing diffusely abnormal liver and intrahepatic ductal dilatation with ductal calculi all suspicious for PBC.  She has no history of pyogenic cholangiohepatitis that we know of so this is less likely.  HIV screening nonreactive so HIV cholangiopathy also ruled out  *   Asthma.      PLAN:     *   Ordered multiple lab tests including ANCA, ANA, AMA, IgG, IgM, IgE G subtype 4.    *    Orders placed in the dep[place] for EGD tomorrow.  She can have clears tonight if she so desires but n.p.o. after midnight   Azucena Freed  02/06/2018, 4:49 PM Phone [phone]

## 2018-02-06 NOTE — Progress Notes (Signed)
Pt back from MRI. Pt instructed that she is still NPO and waiting for GI.

## 2018-02-06 NOTE — Progress Notes (Addendum)
PROGRESS NOTE    Monique Harrison  GGY:[id number] DOB: 08/24/1975 DOA: 02/05/2018 PCP: Patient, No Pcp Per    Brief Narrative: 42 year old female with asthma, allergies, obesity who comes in with abdominal pain.  Not smoke drink or use any recreational drugs.  2 weeks ago she developed epigastric pain thought to be indigestion.  Baking soda and water was unhelpful to relieve her pain pain continues every few days.  Dull and epigastric.  AST and ALT are markedly elevated at admission and have continued to increase.  Patient's gamma GT is markedly elevated.  MRI/MRA is ordered and results abnormal as below.  She has been seen by GI who is planned EGD for tomorrow.    Assessment & Plan:   Active Problems:   Recurrent pyogenic cholangitis   Abdominal pain   #Abdominal pain with cholestatic (primarily elevated AP) liver injury Course: presents with multi-week hx of abdominal pain aggravated by PO intake, found to have hepatic steatosis on imaging, AP of 684, AST/ALT in 200s, T bili of 1.9. A/P: no frank evidence of biliary dilation on [place] in ED.   Considerations include primary biliary cirrhosis, hepatitis, hepatic steatosis?, vs other.    Gamma GT was markedly abnormal suggesting intrinsic liver disease, MRA of abdomen revealed findings consistent with chronic-recurrent pyogenic cholangitis which she has been evaluated by GI who plan EGD in a.m.  #Other problems: -Obesity: outpatient weight optimization, would benefit from PCP establishment, I spent extensive amount of time discussing with the patient a low-fat low carbohydrate diet to include intermittent fasting to decrease her inflammatory markers and decrease fat deposition -Xanthelasmas: obtaining lipid profile to further evaluate, A1c, see above -Chronic allergies: continue home medications -Asthma: not in exacerbation, has chronic wheezing at baseline, on room air, continue Advair equivalent while in house, bronchodilators PRN -Liver  lesions: See concerning MRI report  DVT prophylaxis: Subq Lovenox Code Status: Do not perform CPR / defibrillation, would like intubation if respiratory failure, this was confirmed with the patient on day of admission Disposition Plan: Home hopefully in 24 hours Consults called: GI consult service consulted in emergency department Admission status: admit to hospital medicine service    Subjective: With abdominal pain.  Has remained n.p.o. all day.  No further nausea or vomiting.  Objective: Vitals:   02/06/18 0651 02/06/18 0829 02/06/18 1510 02/06/18 1554  BP: 114/82  109/82   Pulse: 72  80   Resp: 18  16   Temp:   99.6 F (37.6 C) 98.8 F (37.1 C)  TempSrc:   Axillary Oral  SpO2: 95% 97% 96%   Weight:      Height:        Intake/Output Summary (Last 24 hours) at 02/06/2018 1707 Last data filed at 02/06/2018 0400 Gross per 24 hour  Intake 574.07 ml  Output -  Net 574.07 ml   Filed Weights   02/05/18 2112  Weight: 120.2 kg    Examination:  General exam: Appears calm and comfortable  Respiratory system: Clear to auscultation. Respiratory effort normal. Cardiovascular system: S1 & S2 heard, RRR. No JVD, murmurs, rubs, gallops or clicks. No pedal edema. Gastrointestinal system: Abdomen is nondistended, soft and nontender. No organomegaly or masses felt. Normal bowel sounds heard. Central nervous system: Alert and oriented. No focal neurological deficits. Extremities: Symmetric 5 x 5 power. Skin: No rashes, lesions or ulcers Psychiatry: Judgement and insight appear normal. Mood & affect appropriate.     Data Reviewed: I have personally reviewed following labs and imaging studies  CBC: Recent Labs  Lab 02/05/18 1401 02/06/18 0649  WBC 8.3 8.7  NEUTROABS  --  5.0  HGB 13.3 13.5  HCT 41.1 41.7  MCV 93.0 93.1  PLT 291 867   Basic Metabolic Panel: Recent Labs  Lab 02/05/18 1401 02/06/18 0649  NA 138 135  K 4.0 4.4  CL 103 106  CO2 27 24  GLUCOSE 111* 84    BUN 7 5*  CREATININE 0.75 0.73  CALCIUM 9.1 8.4*   GFR: Estimated Creatinine Clearance: 121 mL/min (by C-G formula based on SCr of 0.73 mg/dL). Liver Function Tests: Recent Labs  Lab 02/05/18 1401 02/06/18 0649  AST 287* 244*  ALT 227* 243*  ALKPHOS 684* 727*  BILITOT 1.9* 1.6*  PROT 7.6 7.8  ALBUMIN 3.0* 3.0*   Recent Labs  Lab 02/05/18 1401  LIPASE 29   No results for input(s): AMMONIA in the last 168 hours. Coagulation Profile: Recent Labs  Lab 02/05/18 1820  INR 0.96   Cardiac Enzymes: No results for input(s): CKTOTAL, CKMB, CKMBINDEX, TROPONINI in the last 168 hours. BNP (last 3 results) No results for input(s): PROBNP in the last 8760 hours. HbA1C: Recent Labs    02/05/18 1401  HGBA1C 4.9   CBG: No results for input(s): GLUCAP in the last 168 hours. Lipid Profile: Recent Labs    02/05/18 1401  CHOL 292*  HDL 53  LDLCALC 219*  TRIG 98  CHOLHDL 5.5   Thyroid Function Tests: No results for input(s): TSH, T4TOTAL, FREET4, T3FREE, THYROIDAB in the last 72 hours. Anemia Panel: No results for input(s): VITAMINB12, FOLATE, FERRITIN, TIBC, IRON, RETICCTPCT in the last 72 hours. Sepsis Labs: No results for input(s): PROCALCITON, LATICACIDVEN in the last 168 hours.  No results found for this or any previous visit (from the past 240 hour(s)).       Radiology Studies: Mr 3d Recon At Scanner  Result Date: 02/06/2018 CLINICAL DATA:  Abnormal liver function tests.  Abdominal pain. EXAM: MRI ABDOMEN WITHOUT AND WITH CONTRAST (INCLUDING MRCP) TECHNIQUE: Multiplanar multisequence MR imaging of the abdomen was performed both before and after the administration of intravenous contrast. Heavily T2-weighted images of the biliary and pancreatic ducts were obtained, and three-dimensional MRCP images were rendered by post processing. CONTRAST:  10 cc Gadavist COMPARISON:  02/05/2018 abdominal ultrasound. This demonstrated hyperechoic liver lesions. FINDINGS: Portions  of exam are mild to moderately motion degraded. Lower chest: Normal heart size without pericardial or pleural effusion. Hepatobiliary: Abnormal appearance of the liver, with central areas of tubular mixed T2 signal, highly suspicious for dilated intrahepatic ducts with ductal calculi. Example images 14/8, 13/4, 10/14, and 44/12. These are most conspicuous within the left hepatic lobe. Somewhat more masslike peripheral lateral segment left liver lobe T2 hyperintensity, including on image 15/4, measures 1.7 cm and is favored to represent focal intrahepatic duct dilatation. Similarly, T2 hypointensity at 1.8 cm in the right hepatic lobe on 16/8 is favored to be related to focal intrahepatic duct dilatation and intrahepatic ductal stones within. No gallstones or common duct dilatation. The common duct is upper normal in size for age at 7 mm. Pancreas:  Normal, without mass or ductal dilatation. Spleen: Nonenhancing anterior splenic lesion measures 8.0 x 6.1 cm on image 12/4. Peripheral T2 hypointensity with heterogeneous central T2 signal. No post-contrast enhancement. Smaller nonspecific T2 hyperintense posterior splenic lesions. No splenomegaly. Adrenals/Urinary Tract: Normal adrenal glands. Normal kidneys, without hydronephrosis. Stomach/Bowel: Grossly normal stomach and abdominal bowel loops. Vascular/Lymphatic: Normal caliber of the aorta and  branch vessels. Prominent porta hepatis nodes are likely reactive, not pathologic by size criteria. Example 11 mm. Other:  No ascites. Musculoskeletal: No acute osseous abnormality. IMPRESSION: 1. Portions of exam are mild to moderately motion degraded. 2. Diffusely abnormal appearance of the liver, worse on the left. Favored to represent intrahepatic biliary duct dilatation with numerous ductal calculi. 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 Differential considerations include primary sclerosing cholangitis, HIV cholangiopathy. Given  the difficulty with visualization of intrahepatic calculi at MRI, and motion on the current exam, liver protocol CT may be informative. 3. Heterogeneous splenic lesion is favored to represent the sequelae of prior trauma (chronic hematoma) or infection. Recommend attention on follow-up. 4. Mildly prominent porta hepatis nodes, favored to be reactive. These results will be called to the ordering clinician or representative by the Radiologist Assistant, and communication documented in the PACS or zVision Dashboard. Electronically Signed   By: Abigail Miyamoto M.D.   On: 02/06/2018 14:03   Mr Abdomen Mrcp Moise Boring Contast  Result Date: 02/06/2018 CLINICAL DATA:  Abnormal liver function tests.  Abdominal pain. EXAM: MRI ABDOMEN WITHOUT AND WITH CONTRAST (INCLUDING MRCP) TECHNIQUE: Multiplanar multisequence MR imaging of the abdomen was performed both before and after the administration of intravenous contrast. Heavily T2-weighted images of the biliary and pancreatic ducts were obtained, and three-dimensional MRCP images were rendered by post processing. CONTRAST:  10 cc Gadavist COMPARISON:  02/05/2018 abdominal ultrasound. This demonstrated hyperechoic liver lesions. FINDINGS: Portions of exam are mild to moderately motion degraded. Lower chest: Normal heart size without pericardial or pleural effusion. Hepatobiliary: Abnormal appearance of the liver, with central areas of tubular mixed T2 signal, highly suspicious for dilated intrahepatic ducts with ductal calculi. Example images 14/8, 13/4, 10/14, and 44/12. These are most conspicuous within the left hepatic lobe. Somewhat more masslike peripheral lateral segment left liver lobe T2 hyperintensity, including on image 15/4, measures 1.7 cm and is favored to represent focal intrahepatic duct dilatation. Similarly, T2 hypointensity at 1.8 cm in the right hepatic lobe on 16/8 is favored to be related to focal intrahepatic duct dilatation and intrahepatic ductal stones within.  No gallstones or common duct dilatation. The common duct is upper normal in size for age at 7 mm. Pancreas:  Normal, without mass or ductal dilatation. Spleen: Nonenhancing anterior splenic lesion measures 8.0 x 6.1 cm on image 12/4. Peripheral T2 hypointensity with heterogeneous central T2 signal. No post-contrast enhancement. Smaller nonspecific T2 hyperintense posterior splenic lesions. No splenomegaly. Adrenals/Urinary Tract: Normal adrenal glands. Normal kidneys, without hydronephrosis. Stomach/Bowel: Grossly normal stomach and abdominal bowel loops. Vascular/Lymphatic: Normal caliber of the aorta and branch vessels. Prominent porta hepatis nodes are likely reactive, not pathologic by size criteria. Example 11 mm. Other:  No ascites. Musculoskeletal: No acute osseous abnormality. IMPRESSION: 1. Portions of exam are mild to moderately motion degraded. 2. Diffusely abnormal appearance of the liver, worse on the left. Favored to represent intrahepatic biliary duct dilatation with numerous ductal calculi. 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 Differential considerations include primary sclerosing cholangitis, HIV cholangiopathy. Given the difficulty with visualization of intrahepatic calculi at MRI, and motion on the current exam, liver protocol CT may be informative. 3. Heterogeneous splenic lesion is favored to represent the sequelae of prior trauma (chronic hematoma) or infection. Recommend attention on follow-up. 4. Mildly prominent porta hepatis nodes, favored to be reactive. These results will be called to the ordering clinician or representative by the Radiologist Assistant, and communication documented in  the PACS or zVision Dashboard. Electronically Signed   By: Abigail Miyamoto M.D.   On: 02/06/2018 14:03   US Abdomen Limited Ruq  Result Date: 02/05/2018 CLINICAL DATA:  Right upper quadrant tenderness EXAM: ULTRASOUND ABDOMEN LIMITED RIGHT UPPER QUADRANT  COMPARISON:  None. FINDINGS: Gallbladder: No gallstones, gallbladder wall thickening, or pericholecystic fluid. Negative sonographic Murphy's sign. Common bile duct: Diameter: 6 mm Liver: Hyperechoic hepatic parenchyma, suggesting hepatic steatosis. 2.0 x 2.0 x 1.9 cm hyperechoic lesion in the central right hepatic lobe (image 38). Additional 2.0 x 2.8 x 2.4 cm vague hyperechoic lesion in the right hepatic lobe. Portal vein is patent on color Doppler imaging with normal direction of blood flow towards the liver. IMPRESSION: Suspected hepatic steatosis. Two hyperechoic hepatic lesions, likely reflecting benign hemangiomas, although incompletely characterized. Consider follow-up MRI abdomen with/without contrast in 3 months. Electronically Signed   By: Julian Hy M.D.   On: 02/05/2018 17:40        Scheduled Meds: . enoxaparin (LOVENOX) injection  40 mg Subcutaneous Q24H  . loratadine  10 mg Oral Daily  . mometasone-formoterol  2 puff Inhalation BID  . montelukast  10 mg Oral QHS  . [START ON 02/07/2018] pantoprazole  40 mg Oral Q0600  . sodium chloride flush  3 mL Intravenous Q12H   Continuous Infusions:   LOS: 0 days    Time spent: 65 minutes    Lady Deutscher, MD, FACP [facility] Pager [phone]  If 7PM-7AM, please contact night-coverage [email or web address] Password [email or web address] 02/06/2018, 5:07 PM

## 2018-02-06 NOTE — H&P (View-Only) (Signed)
[facility] Consult: 4:49 PM 02/06/2018  LOS: 0 days    Referring Provider: Dr. Evangeline Gula Primary Care Physician:  Patient, No Pcp Per Primary Gastroenterologist: Patient is unassigned    Reason for Consultation: Normal LFTs and abnormal MRCP   HPI: Tomasita Beevers is a 42 y.o. female.  Hx GI bleed in 2016.  Bleeding attributed to frequent Naprosyn though pt recalls no lesions on EGD or colonoscopy in [place].  History of abnormal LFTs in the mid to thousands, could have been 2005 or so.  She was referred to a GI doctor who obtained multiple blood tests and performed MRI, possibly MRCP, after that she had no further testing.  She does not recall being told the etiology of her abnormal LFTs but they have been persistently elevated for many years.  Asthma.  Early last week she had some vague, moderate pain in the left flank.  Around Wednesday after eating she developed acute epigastric pain with nausea, she felt it was heartburn.  She took some baking soda mixed with water and the symptoms passed after about 3 hours.  The same pain recurred on Friday after eating, while she was at work and she got Mylanta and Alka-Seltzer.  The symptoms passed in few hours.  Saturday when she woke up the pain was still there and it was intermittent all day long.  It was still there on Sunday so she came to the emergency department.  The pain is primarily in the epigastrium but does not radiate.  On exam she has tenderness both there and in the right upper quadrant.  As long as she does not eat she does not have the pain.  Although there is been some intermittent nausea, she has not vomited.  Pain triggered by trying to take a deep breath.  Does like there is a bubble in her epigastrium. This 400 to 600 mg of ibuprofen daily a few  times every month for sinus congestion and headaches.  Normally has a good appetite with stable weight and no issues with food consumption.  Lipase 29.  T bili 1.9.  Alkaline phosphatase 727.  AST/ALT 287/227.  GGT 1,157. WBCs 12.9.  Hgb 13.3.  MCV 94.  Platelets 304K.   HIV screening nonreactive. Abdominal ultrasound:   Hepatic steatosis.  2 hyperechoic hepatic lesions, likely reflect benign hemangiomas although incompletely characterized.  Consider MRI abdomen in 3 months.  CBD 6 mm.  Portal vein Doppler studies normal MRCP:   Somewhat motion degraded exam.  Diffusely abnormal liver, particularly on the left.  Changes favoring intrahepatic biliary ductal dilatation with numerous ductal calculi findings of which are suspicious for recurrent pyogenic cholangiohepatitis versus primary sclerosing cholangitis versus HIV cholangiopathy.  Region difficult to visualize and liver protocol CT may be informative.  Patient works as a Product/process development scientist, she is a Education officer, museum.  She does not drink alcohol. Family history negative for autoimmune disease.  Past Medical History:  Diagnosis Date  . Abnormal LFTs 2005  . Asthma   . Recurrent upper respiratory infection (URI)   . Upper  GI bleed 2016   [place]; presented with melena; admitted for 1 week; thought to be due to frequent Naproxen use  . Vitamin D deficiency     Past Surgical History:  Procedure Laterality Date  . NO PAST SURGERIES      Prior to Admission medications   Medication Sig Start Date End Date Taking? Authorizing Provider  albuterol (PROVENTIL HFA;VENTOLIN HFA) 108 (90 Base) MCG/ACT inhaler Inhale 2 puffs into the lungs every 4 (four) hours as needed for wheezing or shortness of breath. 08/29/17  Yes Padgett, Rae Halsted, MD  albuterol (PROVENTIL) (2.5 MG/3ML) 0.083% nebulizer solution Take 3 mLs (2.5 mg total) by nebulization every 4 (four) hours as needed for wheezing or shortness of breath. 08/29/17  Yes Padgett, Rae Halsted, MD  aspirin EC 325 MG tablet Take 325 mg by mouth daily as needed (pain/headache).   Yes [provider]  cetirizine-pseudoephedrine (ZYRTEC-D) 5-120 MG tablet Take 1 tablet by mouth See admin instructions. Take 1 tablet of either Zyrtec-D or Allegra-D by mouth twice daily   Yes [provider]  Cholecalciferol (VITAMIN D3) 3000 units TABS Take 3,000 Units by mouth daily.   Yes [provider]  fexofenadine-pseudoephedrine (ALLEGRA-D) 60-120 MG 12 hr tablet Take 1 tablet by mouth See admin instructions. Take 1 tablet of either Allegra-D or Zyrtec-D by mouth twice daily   Yes [provider]  Fluticasone-Salmeterol (ADVAIR) 500-50 MCG/DOSE AEPB Inhale 1 puff into the lungs 2 (two) times daily. 08/29/17  Yes Kennith Gain, MD  MAGNESIUM PO Take 1 tablet by mouth daily.   Yes [provider]  montelukast (SINGULAIR) 10 MG tablet Take 1 tablet (10 mg total) by mouth at bedtime. 08/29/17  Yes Padgett, Rae Halsted, MD  azelastine (ASTELIN) 0.1 % nasal spray Place 2 sprays into both nostrils 2 (two) times daily. Patient not taking: Reported on 02/05/2018 08/29/17   Kennith Gain, MD  cetirizine (ZYRTEC ALLERGY) 10 MG tablet Take 1 tablet (10 mg total) by mouth daily. Patient not taking: Reported on 02/05/2018 02/21/17   Zigmund Gottron, NP  fluticasone (FLOVENT HFA) 220 MCG/ACT inhaler Inhale 2 puffs into the lungs 2 (two) times daily. Patient not taking: Reported on 02/05/2018 08/29/17   Kennith Gain, MD    Scheduled Meds: . enoxaparin (LOVENOX) injection  40 mg Subcutaneous Q24H  . loratadine  10 mg Oral Daily  . mometasone-formoterol  2 puff Inhalation BID  . montelukast  10 mg Oral QHS  . [START ON 02/07/2018] pantoprazole  40 mg Oral Q0600  . sodium chloride flush  3 mL Intravenous Q12H   Infusions:  PRN Meds: albuterol, ondansetron (ZOFRAN) IV, oxyCODONE   Allergies as of 02/05/2018 - Review  Complete 02/05/2018  Allergen Reaction Noted  . Aspirin Shortness Of Breath 02/01/2017    Family History  Problem Relation Age of Onset  . Allergic rhinitis Father   . Breast cancer Maternal Grandmother     Social History   Socioeconomic History  . Marital status: Divorced    Spouse name: Not on file  . Number of children: Not on file  . Years of education: Not on file  . Highest education level: Not on file  Occupational History  . Not on file  Social Needs  . Financial resource strain: Not on file  . Food insecurity:    Worry: Not on file    Inability: Not on file  . Transportation needs:    Medical: Not on file  Non-medical: Not on file  Tobacco Use  . Smoking status: Never Smoker  . Smokeless tobacco: Never Used  Substance and Sexual Activity  . Alcohol use: No  . Drug use: No  . Sexual activity: Yes  Lifestyle  . Physical activity:    Days per week: Not on file    Minutes per session: Not on file  . Stress: Not on file  Relationships  . Social connections:    Talks on phone: Not on file    Gets together: Not on file    Attends religious service: Not on file    Active member of club or organization: Not on file    Attends meetings of clubs or organizations: Not on file    Relationship status: Not on file  . Intimate partner violence:    Fear of current or ex partner: Not on file    Emotionally abused: Not on file    Physically abused: Not on file    Forced sexual activity: Not on file  Other Topics Concern  . Not on file  Social History Narrative  . Not on file    REVIEW OF SYSTEMS: Constitutional: Patient feels a bit tired but has no profound fatigue. ENT:  No nose bleeds Pulm: No recent asthma attacks.  Stable cough, stable, non-severe dyspnea with exertion. CV:  No palpitations, no LE edema.  Chest pain GU:  No hematuria, no frequency.  No amber-colored urine. GI: Per HPI. Heme: Usual bleeding or bruising.  Acholic stools. Transfusions:  None. Neuro:  No headaches, no peripheral tingling or numbness Derm: Does have itching in her palms.  Never had jaundice that she is aware of. No itching, no rash or sores.  Patient has professionally applied tattoos starting in 1996, the last tattoo was in 2016. Endocrine:  No sweats or chills.  No polyuria or dysuria Immunization: Not queried. Travel:  None beyond local counties in last few months.    PHYSICAL EXAM: Vital signs in last 24 hours: Vitals:   02/06/18 1510 02/06/18 1554  BP: 109/82   Pulse: 80   Resp: 16   Temp: 99.6 F (37.6 C) 98.8 F (37.1 C)  SpO2: 96%    Wt Readings from Last 3 Encounters:  02/05/18 120.2 kg  12/25/17 124.7 kg  05/13/17 122 kg    General: Obese, comfortable, well-appearing AAF Head: Facial asymmetry or swelling.  No signs of head trauma. Eyes: Lateral icterus.  No conjunctival pallor.  Xanthelasmas bil on eyelids.   Ears: Not hard of hearing Nose: It is congestion, no drainage. Mouth: Moist, clear, pink oropharynx.  Tongue midline.  Good dentition. Neck: JVD, no thyromegaly, no masses. Lungs: No labored breathing.  Slight loose sounding cough.  Intermittent bilateral wheezing. Heart: RRR.  No MRG.  S1, S2 present. Abdomen: Soft.  Tenderness in the epigastrium and right upper quadrant.  No guarding or rebound.  No HSM, masses, bruits, hernias..   Rectal: Deferred Musc/Skeltl: No significant joint deformities.  No joint erythema or swelling. Extremities: CCE. Neurologic: Alert.  Oriented x3.  No tremors or asterixis.  No limb weakness. Skin: Obvious jaundice. Tattoos: Several, on her left wrist, left upper chest and back.  All professional quality. Psych: Does not, well spoken, cooperative.    Intake/Output from previous day: 10/06 0701 - 10/07 0700 In: 574.1 [I.V.:574.1] Out: -  Intake/Output this shift: No intake/output data recorded.  LAB RESULTS: Recent Labs    02/05/18 1401 02/06/18 0649  WBC 8.3 8.7  HGB 13.3  13.5  HCT  41.1 41.7  PLT 291 293   BMET Lab Results  Component Value Date   NA 135 02/06/2018   NA 138 02/05/2018   K 4.4 02/06/2018   K 4.0 02/05/2018   CL 106 02/06/2018   CL 103 02/05/2018   CO2 24 02/06/2018   CO2 27 02/05/2018   GLUCOSE 84 02/06/2018   GLUCOSE 111 (H) 02/05/2018   BUN 5 (L) 02/06/2018   BUN 7 02/05/2018   CREATININE 0.73 02/06/2018   CREATININE 0.75 02/05/2018   CALCIUM 8.4 (L) 02/06/2018   CALCIUM 9.1 02/05/2018   LFT Recent Labs    02/05/18 1401 02/06/18 0649  PROT 7.6 7.8  ALBUMIN 3.0* 3.0*  AST 287* 244*  ALT 227* 243*  ALKPHOS 684* 727*  BILITOT 1.9* 1.6*   PT/INR Lab Results  Component Value Date   INR 0.96 02/05/2018   Hepatitis Panel No results for input(s): HEPBSAG, HCVAB, HEPAIGM, HEPBIGM in the last 72 hours. C-Diff No components found for: CDIFF Lipase     Component Value Date/Time   LIPASE 29 02/05/2018 1401    Drugs of Abuse  No results found for: LABOPIA, COCAINSCRNUR, LABBENZ, AMPHETMU, THCU, LABBARB   RADIOLOGY STUDIES: Mr 3d Recon At Scanner  Result Date: 02/06/2018 CLINICAL DATA:  Abnormal liver function tests.  Abdominal pain. EXAM: MRI ABDOMEN WITHOUT AND WITH CONTRAST (INCLUDING MRCP) TECHNIQUE: Multiplanar multisequence MR imaging of the abdomen was performed both before and after the administration of intravenous contrast. Heavily T2-weighted images of the biliary and pancreatic ducts were obtained, and three-dimensional MRCP images were rendered by post processing. CONTRAST:  10 cc Gadavist COMPARISON:  02/05/2018 abdominal ultrasound. This demonstrated hyperechoic liver lesions. FINDINGS: Portions of exam are mild to moderately motion degraded. Lower chest: Normal heart size without pericardial or pleural effusion. Hepatobiliary: Abnormal appearance of the liver, with central areas of tubular mixed T2 signal, highly suspicious for dilated intrahepatic ducts with ductal calculi. Example images 14/8, 13/4, 10/14, and 44/12.  These are most conspicuous within the left hepatic lobe. Somewhat more masslike peripheral lateral segment left liver lobe T2 hyperintensity, including on image 15/4, measures 1.7 cm and is favored to represent focal intrahepatic duct dilatation. Similarly, T2 hypointensity at 1.8 cm in the right hepatic lobe on 16/8 is favored to be related to focal intrahepatic duct dilatation and intrahepatic ductal stones within. No gallstones or common duct dilatation. The common duct is upper normal in size for age at 7 mm. Pancreas:  Normal, without mass or ductal dilatation. Spleen: Nonenhancing anterior splenic lesion measures 8.0 x 6.1 cm on image 12/4. Peripheral T2 hypointensity with heterogeneous central T2 signal. No post-contrast enhancement. Smaller nonspecific T2 hyperintense posterior splenic lesions. No splenomegaly. Adrenals/Urinary Tract: Normal adrenal glands. Normal kidneys, without hydronephrosis. Stomach/Bowel: Grossly normal stomach and abdominal bowel loops. Vascular/Lymphatic: Normal caliber of the aorta and branch vessels. Prominent porta hepatis nodes are likely reactive, not pathologic by size criteria. Example 11 mm. Other:  No ascites. Musculoskeletal: No acute osseous abnormality. IMPRESSION: 1. Portions of exam are mild to moderately motion degraded. 2. Diffusely abnormal appearance of the liver, worse on the left. Favored to represent intrahepatic biliary duct dilatation with numerous ductal calculi. 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 Differential considerations include primary sclerosing cholangitis, HIV cholangiopathy. Given the difficulty with visualization of intrahepatic calculi at MRI, and motion on the current exam, liver protocol CT may be informative. 3. Heterogeneous splenic lesion is favored to represent the sequelae of  prior trauma (chronic hematoma) or infection. Recommend attention on follow-up. 4. Mildly prominent porta hepatis nodes,  favored to be reactive. These results will be called to the ordering clinician or representative by the Radiologist Assistant, and communication documented in the PACS or zVision Dashboard. Electronically Signed   By: Abigail Miyamoto M.D.   On: 02/06/2018 14:03   Mr Abdomen Mrcp Moise Boring Contast  Result Date: 02/06/2018 CLINICAL DATA:  Abnormal liver function tests.  Abdominal pain. EXAM: MRI ABDOMEN WITHOUT AND WITH CONTRAST (INCLUDING MRCP) TECHNIQUE: Multiplanar multisequence MR imaging of the abdomen was performed both before and after the administration of intravenous contrast. Heavily T2-weighted images of the biliary and pancreatic ducts were obtained, and three-dimensional MRCP images were rendered by post processing. CONTRAST:  10 cc Gadavist COMPARISON:  02/05/2018 abdominal ultrasound. This demonstrated hyperechoic liver lesions. FINDINGS: Portions of exam are mild to moderately motion degraded. Lower chest: Normal heart size without pericardial or pleural effusion. Hepatobiliary: Abnormal appearance of the liver, with central areas of tubular mixed T2 signal, highly suspicious for dilated intrahepatic ducts with ductal calculi. Example images 14/8, 13/4, 10/14, and 44/12. These are most conspicuous within the left hepatic lobe. Somewhat more masslike peripheral lateral segment left liver lobe T2 hyperintensity, including on image 15/4, measures 1.7 cm and is favored to represent focal intrahepatic duct dilatation. Similarly, T2 hypointensity at 1.8 cm in the right hepatic lobe on 16/8 is favored to be related to focal intrahepatic duct dilatation and intrahepatic ductal stones within. No gallstones or common duct dilatation. The common duct is upper normal in size for age at 7 mm. Pancreas:  Normal, without mass or ductal dilatation. Spleen: Nonenhancing anterior splenic lesion measures 8.0 x 6.1 cm on image 12/4. Peripheral T2 hypointensity with heterogeneous central T2 signal. No post-contrast  enhancement. Smaller nonspecific T2 hyperintense posterior splenic lesions. No splenomegaly. Adrenals/Urinary Tract: Normal adrenal glands. Normal kidneys, without hydronephrosis. Stomach/Bowel: Grossly normal stomach and abdominal bowel loops. Vascular/Lymphatic: Normal caliber of the aorta and branch vessels. Prominent porta hepatis nodes are likely reactive, not pathologic by size criteria. Example 11 mm. Other:  No ascites. Musculoskeletal: No acute osseous abnormality. IMPRESSION: 1. Portions of exam are mild to moderately motion degraded. 2. Diffusely abnormal appearance of the liver, worse on the left. Favored to represent intrahepatic biliary duct dilatation with numerous ductal calculi. 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 Differential considerations include primary sclerosing cholangitis, HIV cholangiopathy. Given the difficulty with visualization of intrahepatic calculi at MRI, and motion on the current exam, liver protocol CT may be informative. 3. Heterogeneous splenic lesion is favored to represent the sequelae of prior trauma (chronic hematoma) or infection. Recommend attention on follow-up. 4. Mildly prominent porta hepatis nodes, favored to be reactive. These results will be called to the ordering clinician or representative by the Radiologist Assistant, and communication documented in the PACS or zVision Dashboard. Electronically Signed   By: Abigail Miyamoto M.D.   On: 02/06/2018 14:03   US Abdomen Limited Ruq  Result Date: 02/05/2018 CLINICAL DATA:  Right upper quadrant tenderness EXAM: ULTRASOUND ABDOMEN LIMITED RIGHT UPPER QUADRANT COMPARISON:  None. FINDINGS: Gallbladder: No gallstones, gallbladder wall thickening, or pericholecystic fluid. Negative sonographic Murphy's sign. Common bile duct: Diameter: 6 mm Liver: Hyperechoic hepatic parenchyma, suggesting hepatic steatosis. 2.0 x 2.0 x 1.9 cm hyperechoic lesion in the central right hepatic lobe  (image 38). Additional 2.0 x 2.8 x 2.4 cm vague hyperechoic lesion in the right hepatic lobe. Portal vein is patent  on color Doppler imaging with normal direction of blood flow towards the liver. IMPRESSION: Suspected hepatic steatosis. Two hyperechoic hepatic lesions, likely reflecting benign hemangiomas, although incompletely characterized. Consider follow-up MRI abdomen with/without contrast in 3 months. Electronically Signed   By: Julian Hy M.D.   On: 02/05/2018 17:40     IMPRESSION:   *   Acute epigastric abdominal pain, possibly related to ulcer disease or GERD or the abnormal liver findings  *   Chronically elevated LFTs for several years. MRCP showing diffusely abnormal liver and intrahepatic ductal dilatation with ductal calculi all suspicious for PBC.  She has no history of pyogenic cholangiohepatitis that we know of so this is less likely.  HIV screening nonreactive so HIV cholangiopathy also ruled out  *   Asthma.      PLAN:     *   Ordered multiple lab tests including ANCA, ANA, AMA, IgG, IgM, IgE G subtype 4.    *    Orders placed in the [facility] for EGD tomorrow.  She can have clears tonight if she so desires but n.p.o. after midnight   Azucena Freed  02/06/2018, 4:49 PM Phone [phone]

## 2018-02-06 NOTE — Progress Notes (Signed)
Dr. Evangeline Gula gave permission for ice chips and clear liquids. Pt given ice chips and sprite.

## 2018-02-06 NOTE — Plan of Care (Signed)
Pt verbalized understanding plan of care. Pt NPO for MRI. PT symptoms improving with medication intervention. Denies nausea, vomiting, and diarrhea. Pt knowledgeable and able to manage health care related needs upon discharge.

## 2018-02-06 NOTE — Progress Notes (Signed)
Pt alert and oriented, 0/10 pain. Pt wants to eat, but I explained that she is NPO. Pt medicated immediately before leaving for MRI.

## 2018-02-07 ENCOUNTER — Observation Stay (HOSPITAL_COMMUNITY): Payer: 59 | Admitting: Anesthesiology

## 2018-02-07 ENCOUNTER — Encounter (HOSPITAL_COMMUNITY): Payer: Self-pay | Admitting: *Deleted

## 2018-02-07 ENCOUNTER — Encounter (HOSPITAL_COMMUNITY)
Admission: EM | Disposition: A | Discharge status: Home / Self Care | Payer: Self-pay | Source: Home / Self Care | Attending: Family Medicine

## 2018-02-07 DIAGNOSIS — I959 Hypotension, unspecified: Secondary | ICD-10-CM | POA: Diagnosis not present

## 2018-02-07 DIAGNOSIS — K2901 Acute gastritis with bleeding: Secondary | ICD-10-CM

## 2018-02-07 DIAGNOSIS — K2971 Gastritis, unspecified, with bleeding: Secondary | ICD-10-CM | POA: Diagnosis present

## 2018-02-07 HISTORY — PX: BIOPSY: SHX5522

## 2018-02-07 HISTORY — PX: ESOPHAGOGASTRODUODENOSCOPY (EGD) WITH PROPOFOL: SHX5813

## 2018-02-07 LAB — CBC WITH DIFFERENTIAL/PLATELET
Abs Immature Granulocytes: 0.03 10*3/uL (ref 0.00–0.07)
BASOS PCT: 1 %
Basophils Absolute: 0.1 10*3/uL (ref 0.0–0.1)
EOS PCT: 5 %
Eosinophils Absolute: 0.5 10*3/uL (ref 0.0–0.5)
HCT: 39.2 % (ref 36.0–46.0)
Hemoglobin: 12.8 g/dL (ref 12.0–15.0)
Immature Granulocytes: 0 %
Lymphocytes Relative: 16 %
Lymphs Abs: 1.6 10*3/uL (ref 0.7–4.0)
MCH: 30.1 pg (ref 26.0–34.0)
MCHC: 32.7 g/dL (ref 30.0–36.0)
MCV: 92.2 fL (ref 80.0–100.0)
MONO ABS: 0.6 10*3/uL (ref 0.1–1.0)
Monocytes Relative: 6 %
Neutro Abs: 7 10*3/uL (ref 1.7–7.7)
Neutrophils Relative %: 72 %
Platelets: 262 10*3/uL (ref 150–400)
RBC: 4.25 MIL/uL (ref 3.87–5.11)
RDW: 13.2 % (ref 11.5–15.5)
WBC: 9.8 10*3/uL (ref 4.0–10.5)
nRBC: 0 % (ref 0.0–0.2)

## 2018-02-07 LAB — COMPREHENSIVE METABOLIC PANEL
ALT: 172 U/L — AB (ref 0–44)
AST: 124 U/L — AB (ref 15–41)
Albumin: 2.9 g/dL — ABNORMAL LOW (ref 3.5–5.0)
Alkaline Phosphatase: 661 U/L — ABNORMAL HIGH (ref 38–126)
Anion gap: 8 (ref 5–15)
BILIRUBIN TOTAL: 1.3 mg/dL — AB (ref 0.3–1.2)
BUN: 6 mg/dL (ref 6–20)
CO2: 25 mmol/L (ref 22–32)
CREATININE: 0.76 mg/dL (ref 0.44–1.00)
Calcium: 9.1 mg/dL (ref 8.9–10.3)
Chloride: 104 mmol/L (ref 98–111)
GFR calc Af Amer: >60 mL/min (ref >60)
GFR calc non Af Amer: >60 mL/min (ref >60)
Glucose, Bld: 90 mg/dL (ref 70–99)
Potassium: 4.1 mmol/L (ref 3.5–5.1)
Sodium: 137 mmol/L (ref 135–145)
TOTAL PROTEIN: 7.6 g/dL (ref 6.5–8.1)

## 2018-02-07 LAB — ANCA TITERS
Atypical P-ANCA titer: <1:20 {titer}
P-ANCA: <1:20 {titer}

## 2018-02-07 LAB — IGG, IGA, IGM
IGA: 294 mg/dL (ref 87–352)
IGM (IMMUNOGLOBULIN M), SRM: 249 mg/dL — AB (ref 26–217)
IgG (Immunoglobin G), Serum: 1849 mg/dL — ABNORMAL HIGH (ref 700–1600)

## 2018-02-07 LAB — HEPATITIS PANEL, ACUTE
HCV Ab: <0.1 s/co ratio (ref 0.0–0.9)
HCV Ab: <0.1 s/co ratio (ref 0.0–0.9)
Hep A IgM: NEGATIVE
Hep A IgM: NEGATIVE
Hep B C IgM: NEGATIVE
Hep B C IgM: NEGATIVE
Hepatitis B Surface Ag: NEGATIVE
Hepatitis B Surface Ag: NEGATIVE

## 2018-02-07 LAB — HEMOGLOBIN AND HEMATOCRIT, BLOOD
HCT: 40.3 % (ref 36.0–46.0)
Hemoglobin: 12.7 g/dL (ref 12.0–15.0)

## 2018-02-07 LAB — IGG 4: IGG 4: 118 mg/dL — AB (ref 2–96)

## 2018-02-07 LAB — MITOCHONDRIAL ANTIBODIES: Mitochondrial M2 Ab, IgG: <20 Units (ref 0.0–20.0)

## 2018-02-07 SURGERY — ESOPHAGOGASTRODUODENOSCOPY (EGD) WITH PROPOFOL
Anesthesia: General

## 2018-02-07 MED ORDER — PANTOPRAZOLE SODIUM 40 MG PO TBEC
40.0000 mg | DELAYED_RELEASE_TABLET | Freq: Two times a day (BID) | ORAL | Status: DC
  Administered 2018-02-07 – 2018-02-12 (×11): 40 mg via ORAL
  Filled 2018-02-07 (×11): qty 1

## 2018-02-07 MED ORDER — PROPOFOL 500 MG/50ML IV EMUL
INTRAVENOUS | Status: DC | PRN
  Administered 2018-02-07: 100 ug/kg/min via INTRAVENOUS

## 2018-02-07 MED ORDER — LACTATED RINGERS IV SOLN
INTRAVENOUS | Status: DC | PRN
  Administered 2018-02-07: 11:00:00 via INTRAVENOUS

## 2018-02-07 MED ORDER — SODIUM CHLORIDE 0.9 % IV BOLUS
500.0000 mL | Freq: Once | INTRAVENOUS | Status: AC
  Administered 2018-02-07: 500 mL via INTRAVENOUS

## 2018-02-07 MED ORDER — LACTATED RINGERS IV SOLN
INTRAVENOUS | Status: AC | PRN
  Administered 2018-02-07: 1000 mL via INTRAVENOUS

## 2018-02-07 MED ORDER — SUCRALFATE 1 GM/10ML PO SUSP
1.0000 g | Freq: Three times a day (TID) | ORAL | Status: DC
  Administered 2018-02-07 – 2018-02-12 (×19): 1 g via ORAL
  Filled 2018-02-07 (×19): qty 10

## 2018-02-07 MED ORDER — SODIUM CHLORIDE 0.9 % IV SOLN: INTRAVENOUS | Status: DC | PRN

## 2018-02-07 MED ORDER — PROPOFOL 10 MG/ML IV BOLUS
INTRAVENOUS | Status: DC | PRN
  Administered 2018-02-07: 40 mg via INTRAVENOUS
  Administered 2018-02-07: 30 mg via INTRAVENOUS
  Administered 2018-02-07: 40 mg via INTRAVENOUS

## 2018-02-07 SURGICAL SUPPLY — 15 items

## 2018-02-07 NOTE — H&P (Signed)
History and Physical    Monique Harrison IPJ:[id number] DOB: 1975/06/06 DOA: 02/05/2018  PCP: Patient, No Pcp Per  Patient coming from: Observation unit to inpatient  I have personally briefly reviewed patient's old medical records in [place]  Chief Complaint: Abdominal pain  HPI: Monique Harrison is a 42 y.o. female with medical history significant of asthma, allergies, and obesity who presented with abdominal pain.  She does not smoke drink or use any recreational drugs.  2 weeks ago she developed epigastric pain thought to be indigestion.  She took baking soda and water which was unhelpful to relieve her pain permanently her pain continue to return every few days.  It was dull and epigastric.  She was noted to have elevated LFTs at admission in the emergency department.  Terrence Dupont GT was markedly elevated.  An MRI and MRA was ordered and found to be markedly abnormal suggestive of primary biliary cirrhosis, hepatitis, chronic pyogenic cholangitis or hepatic steatosis.  Evaluation by GI indicated need for esophagogastroduodenoscopy.  She underwent EGD today and was found to have hemorrhagic gastritis and her blood pressures have been low postoperatively.  She continues to have pain and is concerned about her ability to tolerate p.o.  Her blood pressures have been relatively soft and given her hemorrhage I am concerned about worsening of her clinical condition.  She is therefore being admitted to inpatient care for further evaluation and management of normal biliary tree with liver injury, hemorrhagic gastritis and mild hypotension   Review of Systems: As per HPI otherwise all other systems reviewed and  negative.    Past Medical History:  Diagnosis Date  . Abnormal LFTs 2005  . Asthma   . Recurrent upper respiratory infection (URI)   . Upper GI bleed 2016   [place], [place]; presented with melena; admitted for 1 week; thought to be due to frequent Naproxen use  . Vitamin D deficiency      Past Surgical History:  Procedure Laterality Date  . NO PAST SURGERIES      Social History   Social History Narrative  . Not on file     reports that she has never smoked. She has never used smokeless tobacco. She reports that she does not drink alcohol or use drugs.  Allergies  Allergen Reactions  . Aspirin Shortness Of Breath    Pt tolerates aspirin for occasional use.    Family History  Problem Relation Age of Onset  . Allergic rhinitis Father   . Breast cancer Maternal Grandmother      Prior to Admission medications   Medication Sig Start Date End Date Taking? Authorizing Provider  albuterol (PROVENTIL HFA;VENTOLIN HFA) 108 (90 Base) MCG/ACT inhaler Inhale 2 puffs into the lungs every 4 (four) hours as needed for wheezing or shortness of breath. 08/29/17  Yes Padgett, Rae Halsted, MD  albuterol (PROVENTIL) (2.5 MG/3ML) 0.083% nebulizer solution Take 3 mLs (2.5 mg total) by nebulization every 4 (four) hours as needed for wheezing or shortness of breath. 08/29/17  Yes Padgett, Rae Halsted, MD  aspirin EC 325 MG tablet Take 325 mg by mouth daily as needed (pain/headache).   Yes [provider]  cetirizine-pseudoephedrine (ZYRTEC-D) 5-120 MG tablet Take 1 tablet by mouth See admin instructions. Take 1 tablet of either Zyrtec-D or Allegra-D by mouth twice daily   Yes [provider]  Cholecalciferol (VITAMIN D3) 3000 units TABS Take 3,000 Units by mouth daily.   Yes [provider]  fexofenadine-pseudoephedrine (ALLEGRA-D) 60-120 MG 12  hr tablet Take 1 tablet by mouth See admin instructions. Take 1 tablet of either Allegra-D or Zyrtec-D by mouth twice daily   Yes [provider]  Fluticasone-Salmeterol (ADVAIR) 500-50 MCG/DOSE AEPB Inhale 1 puff into the lungs 2 (two) times daily. 08/29/17  Yes Kennith Gain, MD  MAGNESIUM PO Take 1 tablet by mouth daily.   Yes [provider]  montelukast (SINGULAIR) 10 MG  tablet Take 1 tablet (10 mg total) by mouth at bedtime. 08/29/17  Yes Padgett, Rae Halsted, MD  azelastine (ASTELIN) 0.1 % nasal spray Place 2 sprays into both nostrils 2 (two) times daily. Patient not taking: Reported on 02/05/2018 08/29/17   Kennith Gain, MD  cetirizine (ZYRTEC ALLERGY) 10 MG tablet Take 1 tablet (10 mg total) by mouth daily. Patient not taking: Reported on 02/05/2018 02/21/17   Zigmund Gottron, NP  fluticasone (FLOVENT HFA) 220 MCG/ACT inhaler Inhale 2 puffs into the lungs 2 (two) times daily. Patient not taking: Reported on 02/05/2018 08/29/17   Kennith Gain, MD    Physical Exam:  Constitutional: NAD, calm, comfortable Vitals:   02/07/18 1210 02/07/18 1220 02/07/18 1221 02/07/18 1345  BP:  97/62 (!) 91/55 114/80  Pulse: 75 76 77 86  Resp: 17 12 14 16   Temp:    98.7 F (37.1 C)  TempSrc:    Oral  SpO2: 96% 96% 98% 95%  Weight:      Height:       Eyes: PERRL, lids and conjunctivae normal ENMT: Mucous membranes are moist. Posterior pharynx clear of any exudate or lesions.Normal dentition.  Neck: normal, supple, no masses, no thyromegaly Respiratory: clear to auscultation bilaterally, no wheezing, no crackles. Normal respiratory effort. No accessory muscle use.  Cardiovascular: Regular rate and rhythm, no murmurs / rubs / gallops. No extremity edema. 2+ pedal pulses. No carotid bruits.  Abdomen: no tenderness, no masses palpated. No hepatosplenomegaly. Bowel sounds positive.  Musculoskeletal: no clubbing / cyanosis. No joint deformity upper and lower extremities. Good ROM, no contractures. Normal muscle tone.  Skin: no rashes, lesions, ulcers. No induration Neurologic: CN 2-12 grossly intact. Sensation intact, DTR normal. Strength 5/5 in all 4.  Psychiatric: Normal judgment and insight. Alert and oriented x 3. Normal mood.    Labs on Admission: I have personally reviewed following labs and imaging studies  CBC: Recent Labs  Lab  02/05/18 1401 02/06/18 0649 02/07/18 0742  WBC 8.3 8.7 9.8  NEUTROABS  --  5.0 7.0  HGB 13.3 13.5 12.8  HCT 41.1 41.7 39.2  MCV 93.0 93.1 92.2  PLT 291 293 858   Basic Metabolic Panel: Recent Labs  Lab 02/05/18 1401 02/06/18 0649 02/07/18 0742  NA 138 135 137  K 4.0 4.4 4.1  CL 103 106 104  CO2 27 24 25   GLUCOSE 111* 84 90  BUN 7 5* 6  CREATININE 0.75 0.73 0.76  CALCIUM 9.1 8.4* 9.1   GFR: Estimated Creatinine Clearance: 120.9 mL/min (by C-G formula based on SCr of 0.76 mg/dL). Liver Function Tests: Recent Labs  Lab 02/05/18 1401 02/06/18 0649 02/07/18 0742  AST 287* 244* 124*  ALT 227* 243* 172*  ALKPHOS 684* 727* 661*  BILITOT 1.9* 1.6* 1.3*  PROT 7.6 7.8 7.6  ALBUMIN 3.0* 3.0* 2.9*   Recent Labs  Lab 02/05/18 1401  LIPASE 29   No results for input(s): AMMONIA in the last 168 hours. Coagulation Profile: Recent Labs  Lab 02/05/18 1820  INR 0.96   Cardiac Enzymes: No  results for input(s): CKTOTAL, CKMB, CKMBINDEX, TROPONINI in the last 168 hours. BNP (last 3 results) No results for input(s): PROBNP in the last 8760 hours. HbA1C: Recent Labs    02/05/18 1401  HGBA1C 4.9   CBG: No results for input(s): GLUCAP in the last 168 hours. Lipid Profile: Recent Labs    02/05/18 1401  CHOL 292*  HDL 53  LDLCALC 219*  TRIG 98  CHOLHDL 5.5   Thyroid Function Tests: No results for input(s): TSH, T4TOTAL, FREET4, T3FREE, THYROIDAB in the last 72 hours. Anemia Panel: No results for input(s): VITAMINB12, FOLATE, FERRITIN, TIBC, IRON, RETICCTPCT in the last 72 hours. Urine analysis:    Component Value Date/Time   COLORURINE YELLOW 02/05/2018 1620   APPEARANCEUR CLEAR 02/05/2018 1620   LABSPEC 1.008 02/05/2018 1620   PHURINE 8.0 02/05/2018 1620   GLUCOSEU NEGATIVE 02/05/2018 1620   HGBUR NEGATIVE 02/05/2018 1620   BILIRUBINUR NEGATIVE 02/05/2018 1620   KETONESUR NEGATIVE 02/05/2018 1620   PROTEINUR NEGATIVE 02/05/2018 1620   NITRITE NEGATIVE  02/05/2018 1620   LEUKOCYTESUR MODERATE (A) 02/05/2018 1620    Radiological Exams on Admission: Mr 3d Recon At Scanner  Result Date: 02/06/2018 CLINICAL DATA:  Abnormal liver function tests.  Abdominal pain. EXAM: MRI ABDOMEN WITHOUT AND WITH CONTRAST (INCLUDING MRCP) TECHNIQUE: Multiplanar multisequence MR imaging of the abdomen was performed both before and after the administration of intravenous contrast. Heavily T2-weighted images of the biliary and pancreatic ducts were obtained, and three-dimensional MRCP images were rendered by post processing. CONTRAST:  10 cc Gadavist COMPARISON:  02/05/2018 abdominal ultrasound. This demonstrated hyperechoic liver lesions. FINDINGS: Portions of exam are mild to moderately motion degraded. Lower chest: Normal heart size without pericardial or pleural effusion. Hepatobiliary: Abnormal appearance of the liver, with central areas of tubular mixed T2 signal, highly suspicious for dilated intrahepatic ducts with ductal calculi. Example images 14/8, 13/4, 10/14, and 44/12. These are most conspicuous within the left hepatic lobe. Somewhat more masslike peripheral lateral segment left liver lobe T2 hyperintensity, including on image 15/4, measures 1.7 cm and is favored to represent focal intrahepatic duct dilatation. Similarly, T2 hypointensity at 1.8 cm in the right hepatic lobe on 16/8 is favored to be related to focal intrahepatic duct dilatation and intrahepatic ductal stones within. No gallstones or common duct dilatation. The common duct is upper normal in size for age at 7 mm. Pancreas:  Normal, without mass or ductal dilatation. Spleen: Nonenhancing anterior splenic lesion measures 8.0 x 6.1 cm on image 12/4. Peripheral T2 hypointensity with heterogeneous central T2 signal. No post-contrast enhancement. Smaller nonspecific T2 hyperintense posterior splenic lesions. No splenomegaly. Adrenals/Urinary Tract: Normal adrenal glands. Normal kidneys, without hydronephrosis.  Stomach/Bowel: Grossly normal stomach and abdominal bowel loops. Vascular/Lymphatic: Normal caliber of the aorta and branch vessels. Prominent porta hepatis nodes are likely reactive, not pathologic by size criteria. Example 11 mm. Other:  No ascites. Musculoskeletal: No acute osseous abnormality. IMPRESSION: 1. Portions of exam are mild to moderately motion degraded. 2. Diffusely abnormal appearance of the liver, worse on the left. Favored to represent intrahepatic biliary duct dilatation with numerous ductal calculi. 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 Differential considerations include primary sclerosing cholangitis, HIV cholangiopathy. Given the difficulty with visualization of intrahepatic calculi at MRI, and motion on the current exam, liver protocol CT may be informative. 3. Heterogeneous splenic lesion is favored to represent the sequelae of prior trauma (chronic hematoma) or infection. Recommend attention on follow-up. 4. Mildly prominent porta hepatis nodes, favored  to be reactive. These results will be called to the ordering clinician or representative by the Radiologist Assistant, and communication documented in the PACS or zVision Dashboard. Electronically Signed   By: Abigail Miyamoto M.D.   On: 02/06/2018 14:03   Mr Abdomen Mrcp Moise Boring Contast  Result Date: 02/06/2018 CLINICAL DATA:  Abnormal liver function tests.  Abdominal pain. EXAM: MRI ABDOMEN WITHOUT AND WITH CONTRAST (INCLUDING MRCP) TECHNIQUE: Multiplanar multisequence MR imaging of the abdomen was performed both before and after the administration of intravenous contrast. Heavily T2-weighted images of the biliary and pancreatic ducts were obtained, and three-dimensional MRCP images were rendered by post processing. CONTRAST:  10 cc Gadavist COMPARISON:  02/05/2018 abdominal ultrasound. This demonstrated hyperechoic liver lesions. FINDINGS: Portions of exam are mild to moderately motion degraded.  Lower chest: Normal heart size without pericardial or pleural effusion. Hepatobiliary: Abnormal appearance of the liver, with central areas of tubular mixed T2 signal, highly suspicious for dilated intrahepatic ducts with ductal calculi. Example images 14/8, 13/4, 10/14, and 44/12. These are most conspicuous within the left hepatic lobe. Somewhat more masslike peripheral lateral segment left liver lobe T2 hyperintensity, including on image 15/4, measures 1.7 cm and is favored to represent focal intrahepatic duct dilatation. Similarly, T2 hypointensity at 1.8 cm in the right hepatic lobe on 16/8 is favored to be related to focal intrahepatic duct dilatation and intrahepatic ductal stones within. No gallstones or common duct dilatation. The common duct is upper normal in size for age at 7 mm. Pancreas:  Normal, without mass or ductal dilatation. Spleen: Nonenhancing anterior splenic lesion measures 8.0 x 6.1 cm on image 12/4. Peripheral T2 hypointensity with heterogeneous central T2 signal. No post-contrast enhancement. Smaller nonspecific T2 hyperintense posterior splenic lesions. No splenomegaly. Adrenals/Urinary Tract: Normal adrenal glands. Normal kidneys, without hydronephrosis. Stomach/Bowel: Grossly normal stomach and abdominal bowel loops. Vascular/Lymphatic: Normal caliber of the aorta and branch vessels. Prominent porta hepatis nodes are likely reactive, not pathologic by size criteria. Example 11 mm. Other:  No ascites. Musculoskeletal: No acute osseous abnormality. IMPRESSION: 1. Portions of exam are mild to moderately motion degraded. 2. Diffusely abnormal appearance of the liver, worse on the left. Favored to represent intrahepatic biliary duct dilatation with numerous ductal calculi. 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 Differential considerations include primary sclerosing cholangitis, HIV cholangiopathy. Given the difficulty with visualization of  intrahepatic calculi at MRI, and motion on the current exam, liver protocol CT may be informative. 3. Heterogeneous splenic lesion is favored to represent the sequelae of prior trauma (chronic hematoma) or infection. Recommend attention on follow-up. 4. Mildly prominent porta hepatis nodes, favored to be reactive. These results will be called to the ordering clinician or representative by the Radiologist Assistant, and communication documented in the PACS or zVision Dashboard. Electronically Signed   By: Abigail Miyamoto M.D.   On: 02/06/2018 14:03   Ct Liver Abdomen W Wo Contrast  Result Date: 02/06/2018 CLINICAL DATA:  Abnormal liver enzymes in a cholestatic pattern EXAM: CT ABDOMEN AND PELVIS WITHOUT AND WITH CONTRAST TECHNIQUE: Multidetector CT imaging of the abdomen and pelvis was performed following the standard protocol before and following the bolus administration of intravenous contrast. CONTRAST:  152mL ISOVUE-370 IOPAMIDOL (ISOVUE-370) INJECTION 76% COMPARISON:  02/06/2018 FINDINGS: Lower chest: Atelectasis in the lingula and right middle lobe. Airway thickening. Hepatobiliary: Again noted are irregular bile ducts especially in the left hepatic lobe with some areas of cystic dilatation probably in continuity with the bile duct as on  image 29/10. On MRI there were some low signal intensity filling defects in some of these areas suspicious for gallstones; these have relatively intermediate density on CT, and are not calcified or well appreciated does gallstones on CT, but the appearance was very compelling at for gallstones on the MRI. There are other large filling defects in the biliary tree on MRI that are less conspicuous on CT. I do not appreciate that the contents distending the biliary tree were enhancing on the subtraction images from the MRI, and accordingly intraductal tumor seems less likely. Gallbladder unremarkable. No appreciable portal vein thrombosis or patent vein thrombosis. No pneumobilia.  Pancreas: Unremarkable Spleen: Large rim calcified splenic cystic lesion 8.7 by 7.0 cm, without appreciable internal enhancement on CT or on the subtraction images from prior MRI. Several additional small hypodense lesions are present in the spleen posterior to this dominant cystic lesion. Adrenals/Urinary Tract: Unremarkable Stomach/Bowel: Unremarkable Vascular/Lymphatic: Multiple small porta hepatis lymph nodes are present. Reproductive: An IUD is present in the uterus. 4.6 cm fibroid in the uterine fundus. Ovaries unremarkable. Other: 2.9 by 2.8 cm focus of omental fat necrosis in the left lower quadrant on image 80/10 is likely chronic and has rim calcification. Musculoskeletal: Unremarkable IMPRESSION: 1. Irregular bile ducts with numerous small internal filling defects shown on recent MRI. These filling defects are intermediate density but demonstrate heterogeneity against the background biliary fluid density especially involving the left intrahepatic biliary tree. There are dilated portions and irregular portions favoring cholangitis potentially in conjunction with extensive intrahepatic stone formation. Potentially debris could have a similar appearance in the biliary tree. I do not see abnormal enhancement in the biliary tree to suggest biliary tumor on the recent MRI. Primary sclerosing cholangitis would be a top differential diagnostic consideration. 2. Large rim calcified splenic lesion, likely a postinflammatory or posttraumatic lesion. Smaller hypodense lesions along its posterior margin are likewise probably related to the initial insult. Electronically Signed   By: Van Clines M.D.   On: 02/06/2018 21:04   US Abdomen Limited Ruq  Result Date: 02/05/2018 CLINICAL DATA:  Right upper quadrant tenderness EXAM: ULTRASOUND ABDOMEN LIMITED RIGHT UPPER QUADRANT COMPARISON:  None. FINDINGS: Gallbladder: No gallstones, gallbladder wall thickening, or pericholecystic fluid. Negative sonographic  Murphy's sign. Common bile duct: Diameter: 6 mm Liver: Hyperechoic hepatic parenchyma, suggesting hepatic steatosis. 2.0 x 2.0 x 1.9 cm hyperechoic lesion in the central right hepatic lobe (image 38). Additional 2.0 x 2.8 x 2.4 cm vague hyperechoic lesion in the right hepatic lobe. Portal vein is patent on color Doppler imaging with normal direction of blood flow towards the liver. IMPRESSION: Suspected hepatic steatosis. Two hyperechoic hepatic lesions, likely reflecting benign hemangiomas, although incompletely characterized. Consider follow-up MRI abdomen with/without contrast in 3 months. Electronically Signed   By: Julian Hy M.D.   On: 02/05/2018 17:40    EGD showed gastritis with hemorrhage.  Assessment/Plan Principal Problem:   Gastritis with hemorrhage Active Problems:   Hypotension   Primary sclerosing cholangitis   Abdominal pain     #Abdominal pain with gastritis with hemorrhage: Course: presents with multi-week hx of abdominal pain aggravated by PO intake, found to have hepatic steatosis on imaging, AP of 684, AST/ALT in 200s, T bili of 1.9. A/P: EGD revealed gastritis with hemorrhage today.  Proton pump inhibitor is increased to twice daily.  They are continuing to follow her H&H.  We are  advancing her diet aced on patient tolerance.  Patient is being admitted into the hospital for further  evaluation and management from observation.  #Hypotension: Patient's blood pressure is running low post procedure.  We will give her a bolus of normal saline and monitor response to treatment.  Checking repeat H&H. #Possible primary sclerosing cholangitis: FTs although markedly elevated now have been slowly creeping up over a period of years.  Patient has expressed an interest in following up at [facility] hepatology center for this.  Dr. Tarri Glenn is assisting in arranging this.  #Other problems: -Obesity:outpatient weight optimization, would benefit from PCP establishment, I spent extensive  amount of time discussing with the patient a low-fat low carbohydrate diet to include intermittent fasting to decrease her inflammatory markers and decrease fat deposition -Xanthelasmas:obtaining lipid profile to further evaluate, A1c, see above -Chronic allergies:continue home medications -Asthma:not in exacerbation, has chronic wheezing at baseline, on room air, continue Advair equivalent while in house, bronchodilators PRN   DVT prophylaxis:Subq Lovenox Code Status:Do not perform CPR / defibrillation, would like intubation if respiratory failure, this was confirmed with the patient on day of admission Disposition Plan: Home hopefully 24 to 48 hours once patient tolerating p.o. and hemoglobin stable as well as blood pressure resolved Consults called:After [facility] Admission status:admit to hospital medicine servicefrom observation    Lady Deutscher MD [facility] Pager [phone]  If 7PM-7AM, please contact night-coverage [email or web address] Password [email or web address]  02/07/2018, 3:53 PM

## 2018-02-07 NOTE — Op Note (Addendum)
[facility] Patient Name: Monique Harrison Procedure Date : 02/07/2018 MRN: [id number] Attending MD: Thornton Park MD, MD Date of Birth: [birth year] CSN: [id number] Age: 42 Admit Type: Inpatient Procedure:                Upper GI endoscopy Indications:              Epigastric abdominal pain Providers:                Thornton Park MD, MD, Burtis Junes, RN, Cherylynn Ridges, Technician, Neldon Newport CRNA, CRNA Referring MD:              Medicines:                See the Anesthesia note for documentation of the                            administered medications Complications:            No immediate complications. Estimated blood loss:                            Minimal. Estimated Blood Loss:     Estimated blood loss was minimal. Procedure:                Pre-Anesthesia Assessment:                           - Prior to the procedure, a History and Physical                            was performed, and patient medications and                            allergies were reviewed. The patient's tolerance of                            previous anesthesia was also reviewed. The risks                            and benefits of the procedure and the sedation                            options and risks were discussed with the patient.                            All questions were answered, and informed consent                            was obtained. Prior Anticoagulants: The patient has                            taken no previous anticoagulant or antiplatelet  agents. ASA Grade Assessment: II - A patient with                            mild systemic disease. After reviewing the risks                            and benefits, the patient was deemed in                            satisfactory condition to undergo the procedure.                           After obtaining informed consent, the endoscope was     passed under direct vision. Throughout the                            procedure, the patient's blood pressure, pulse, and                            oxygen saturations were monitored continuously. The                            GIF-H190 ([id number]) Olympus Adult EGD was introduced                            through the mouth, and advanced to the second part                            of duodenum. The upper GI endoscopy was                            accomplished without difficulty. The patient                            tolerated the procedure well. Scope In: Scope Out: Findings:      The esophagus was normal.      Diffuse mild inflammation with hemorrhage characterized by erythema,       friability and granularity was found in the gastric body. A single       erosion was present. No ulcers. Biopsies were taken with a cold forceps       for histology. Estimated blood loss was minimal.      The examined duodenum was normal. Impression:               - Normal esophagus.                           - Gastritis with hemorrhage. Single erosion.                            Biopsied.                           - Normal examined duodenum. Recommendation:           - Advance diet as tolerated.                           -  Increase Protonix to 40 mg BID.                           - Add Carafate 1g slurry TID.                           - Avoid all NSAIDs.                           - Await pathology results.                           - Complete evaluation for suspected chronic bile                            duct abnormality such as PSC. Labs are pending and                            will likely take several days to return.                           - Monique Harrison may benefit from outpatient hepatology                            consultation at a local tertiary care center given                            the lack of FDA-approved therapies for bile duct                            diseases and the  chronicity of her disease. She                            asked that we refer her to [facility]. Procedure Code(s):        --- Professional ---                           [phone], Esophagogastroduodenoscopy, flexible,                            transoral; with biopsy, single or multiple Diagnosis Code(s):        --- Professional ---                           K29.71, Gastritis, unspecified, with bleeding                           R10.13, Epigastric pain CPT copyright 2018 [employer]. All rights reserved. The codes documented in this report are preliminary and upon coder review may  be revised to meet current compliance requirements. Thornton Park MD, MD 02/07/2018 11:58:38 AM This report has been signed electronically. Number of Addenda: 0

## 2018-02-07 NOTE — Interval H&P Note (Signed)
History and Physical Interval Note:  02/07/2018 8:23 AM  Monique Harrison  has presented today for surgery, with the diagnosis of Epigastric pain, nausea.  The various methods of treatment have been discussed with the patient and family. After consideration of risks, benefits and other options for treatment, the patient has consented to  Procedure(s): ESOPHAGOGASTRODUODENOSCOPY (EGD) WITH PROPOFOL (N/A) as a surgical intervention .  The patient's history has been reviewed, patient examined, no change in status, stable for surgery.  I have reviewed the patient's chart and labs.  Questions were answered to the patient's satisfaction.     Thornton Park

## 2018-02-07 NOTE — Transfer of Care (Signed)
Immediate Anesthesia Transfer of Care Note  Patient: Monique Harrison  Procedure(s) Performed: ESOPHAGOGASTRODUODENOSCOPY (EGD) WITH PROPOFOL (N/A ) BIOPSY  Patient Location: Endoscopy Unit  Anesthesia Type:MAC  Level of Consciousness: awake, alert  and oriented  Airway & Oxygen Therapy: Patient Spontanous Breathing and Patient connected to nasal cannula oxygen  Post-op Assessment: Report given to RN, Post -op Vital signs reviewed and stable and Patient moving all extremities X 4  Post vital signs: Reviewed and stable  Last Vitals:  Vitals Value Taken Time  BP    Temp    Pulse    Resp    SpO2      Last Pain:  Vitals:   02/07/18 1103  TempSrc: Oral  PainSc: 0-No pain         Complications: No apparent anesthesia complications

## 2018-02-07 NOTE — Progress Notes (Signed)
Monique Harrison is a 42 y.o. female patient admitted from ED awake, alert - oriented  X 4 - no acute distress noted.  VSS - Blood pressure 114/80, pulse 86, temperature 98.7 F (37.1 C), temperature source Oral, resp. rate 16, height 5\' 6"  (1.676 m), weight 120 kg, SpO2 95 %.    IV in place, occlusive dsg intact without redness.  Orientation to room, and floor completed with information packet given to patient/family.  Patient declined safety video at this time.  Admission INP armband ID verified with patient/family, and in place.   SR up x 2, fall assessment complete, with patient and family able to verbalize understanding of risk associated with falls, and verbalized understanding to call nsg before up out of bed.  Call light within reach, patient able to voice, and demonstrate understanding.  Skin, clean-dry- intact without evidence of bruising, or skin tears.   No evidence of skin break down noted on exam.     Will cont to eval and treat per MD orders.  Holley Raring, RN 02/07/2018 6:30 PM

## 2018-02-07 NOTE — Anesthesia Postprocedure Evaluation (Signed)
Anesthesia Post Note  Patient: Monique Harrison  Procedure(s) Performed: ESOPHAGOGASTRODUODENOSCOPY (EGD) WITH PROPOFOL (N/A ) BIOPSY     Patient location during evaluation: PACU Anesthesia Type: General Level of consciousness: awake and alert Pain management: pain level controlled Vital Signs Assessment: post-procedure vital signs reviewed and stable Respiratory status: spontaneous breathing, nonlabored ventilation, respiratory function stable and patient connected to nasal cannula oxygen Cardiovascular status: blood pressure returned to baseline and stable Postop Assessment: no apparent nausea or vomiting Anesthetic complications: no    Last Vitals:  Vitals:   02/07/18 1220 02/07/18 1221  BP: 97/62 (!) 91/55  Pulse: 76 77  Resp: 12 14  Temp:    SpO2: 96% 98%    Last Pain:  Vitals:   02/07/18 1221  TempSrc:   PainSc: 0-No pain                 Yani Coventry

## 2018-02-07 NOTE — Anesthesia Preprocedure Evaluation (Addendum)
Anesthesia Evaluation  Patient identified by MRN, date of birth, ID band Patient awake    Reviewed: Allergy & Precautions, H&P , NPO status , Patient's Chart, lab work & pertinent test results, reviewed documented beta blocker date and time   Airway Mallampati: III  TM Distance: >3 FB Neck ROM: full  Mouth opening: Limited Mouth Opening  Dental no notable dental hx. (+) Teeth Intact, Dental Advidsory Given   Pulmonary neg pulmonary ROS, asthma , Recent URI , Resolved,    Pulmonary exam normal breath sounds clear to auscultation       Cardiovascular Exercise Tolerance: Good negative cardio ROS   Rhythm:regular Rate:Normal     Neuro/Psych negative neurological ROS  negative psych ROS   GI/Hepatic negative GI ROS, Neg liver ROS,   Endo/Other  negative endocrine ROS  Renal/GU negative Renal ROS  negative genitourinary   Musculoskeletal   Abdominal   Peds  Hematology negative hematology ROS (+)   Anesthesia Other Findings   Reproductive/Obstetrics negative OB ROS                            Anesthesia Physical Anesthesia Plan  ASA: II  Anesthesia Plan: General   Post-op Pain Management:    Induction:   PONV Risk Score and Plan: 3 and Treatment may vary due to age or medical condition  Airway Management Planned: Nasal Cannula  Additional Equipment:   Intra-op Plan:   Post-operative Plan:   Informed Consent: I have reviewed the patients History and Physical, chart, labs and discussed the procedure including the risks, benefits and alternatives for the proposed anesthesia with the patient or authorized representative who has indicated his/her understanding and acceptance.   Dental Advisory Given  Plan Discussed with: Surgeon, Anesthesiologist and CRNA  Anesthesia Plan Comments:        Anesthesia Quick Evaluation

## 2018-02-08 ENCOUNTER — Inpatient Hospital Stay (HOSPITAL_COMMUNITY): Payer: 59

## 2018-02-08 ENCOUNTER — Encounter (HOSPITAL_COMMUNITY): Payer: Self-pay

## 2018-02-08 DIAGNOSIS — R945 Abnormal results of liver function studies: Secondary | ICD-10-CM

## 2018-02-08 DIAGNOSIS — K743 Primary biliary cirrhosis: Secondary | ICD-10-CM | POA: Diagnosis present

## 2018-02-08 DIAGNOSIS — K8301 Primary sclerosing cholangitis: Secondary | ICD-10-CM | POA: Diagnosis present

## 2018-02-08 DIAGNOSIS — R1012 Left upper quadrant pain: Secondary | ICD-10-CM | POA: Diagnosis not present

## 2018-02-08 DIAGNOSIS — Z6841 Body Mass Index (BMI) 40.0 and over, adult: Secondary | ICD-10-CM | POA: Diagnosis not present

## 2018-02-08 DIAGNOSIS — D734 Cyst of spleen: Secondary | ICD-10-CM | POA: Diagnosis present

## 2018-02-08 DIAGNOSIS — Z79899 Other long term (current) drug therapy: Secondary | ICD-10-CM | POA: Diagnosis not present

## 2018-02-08 DIAGNOSIS — Z7982 Long term (current) use of aspirin: Secondary | ICD-10-CM | POA: Diagnosis not present

## 2018-02-08 DIAGNOSIS — D1803 Hemangioma of intra-abdominal structures: Secondary | ICD-10-CM | POA: Diagnosis present

## 2018-02-08 DIAGNOSIS — J45909 Unspecified asthma, uncomplicated: Secondary | ICD-10-CM | POA: Diagnosis present

## 2018-02-08 DIAGNOSIS — X58XXXA Exposure to other specified factors, initial encounter: Secondary | ICD-10-CM | POA: Diagnosis present

## 2018-02-08 DIAGNOSIS — R1013 Epigastric pain: Secondary | ICD-10-CM

## 2018-02-08 DIAGNOSIS — R630 Anorexia: Secondary | ICD-10-CM | POA: Diagnosis present

## 2018-02-08 DIAGNOSIS — K2971 Gastritis, unspecified, with bleeding: Secondary | ICD-10-CM | POA: Diagnosis present

## 2018-02-08 DIAGNOSIS — E669 Obesity, unspecified: Secondary | ICD-10-CM | POA: Diagnosis present

## 2018-02-08 DIAGNOSIS — R7881 Bacteremia: Secondary | ICD-10-CM | POA: Diagnosis not present

## 2018-02-08 DIAGNOSIS — R933 Abnormal findings on diagnostic imaging of other parts of digestive tract: Secondary | ICD-10-CM | POA: Diagnosis present

## 2018-02-08 DIAGNOSIS — B9620 Unspecified Escherichia coli [E. coli] as the cause of diseases classified elsewhere: Secondary | ICD-10-CM | POA: Diagnosis present

## 2018-02-08 DIAGNOSIS — J189 Pneumonia, unspecified organism: Secondary | ICD-10-CM | POA: Diagnosis not present

## 2018-02-08 DIAGNOSIS — N390 Urinary tract infection, site not specified: Secondary | ICD-10-CM | POA: Diagnosis present

## 2018-02-08 DIAGNOSIS — Z886 Allergy status to analgesic agent status: Secondary | ICD-10-CM | POA: Diagnosis not present

## 2018-02-08 DIAGNOSIS — K254 Chronic or unspecified gastric ulcer with hemorrhage: Secondary | ICD-10-CM | POA: Diagnosis present

## 2018-02-08 DIAGNOSIS — K760 Fatty (change of) liver, not elsewhere classified: Secondary | ICD-10-CM | POA: Diagnosis present

## 2018-02-08 DIAGNOSIS — R748 Abnormal levels of other serum enzymes: Secondary | ICD-10-CM | POA: Diagnosis present

## 2018-02-08 DIAGNOSIS — R7989 Other specified abnormal findings of blood chemistry: Secondary | ICD-10-CM | POA: Diagnosis present

## 2018-02-08 DIAGNOSIS — E559 Vitamin D deficiency, unspecified: Secondary | ICD-10-CM | POA: Diagnosis present

## 2018-02-08 DIAGNOSIS — E782 Mixed hyperlipidemia: Secondary | ICD-10-CM | POA: Diagnosis present

## 2018-02-08 DIAGNOSIS — I959 Hypotension, unspecified: Secondary | ICD-10-CM | POA: Diagnosis present

## 2018-02-08 DIAGNOSIS — K2901 Acute gastritis with bleeding: Secondary | ICD-10-CM | POA: Diagnosis not present

## 2018-02-08 DIAGNOSIS — S36119A Unspecified injury of liver, initial encounter: Secondary | ICD-10-CM | POA: Diagnosis present

## 2018-02-08 LAB — HEMOGLOBIN AND HEMATOCRIT, BLOOD
HCT: 39.5 % (ref 36.0–46.0)
HEMATOCRIT: 38.8 % (ref 36.0–46.0)
HEMOGLOBIN: 12.5 g/dL (ref 12.0–15.0)
HEMOGLOBIN: 12.5 g/dL (ref 12.0–15.0)

## 2018-02-08 LAB — ANTINUCLEAR ANTIBODIES, IFA: ANTINUCLEAR ANTIBODIES, IFA: NEGATIVE

## 2018-02-08 MED ORDER — MORPHINE SULFATE (PF) 4 MG/ML IV SOLN
INTRAVENOUS | Status: AC
  Filled 2018-02-08: qty 1

## 2018-02-08 MED ORDER — LORAZEPAM 2 MG/ML IJ SOLN: 0.5000 mg | Freq: Once | INTRAMUSCULAR | Status: DC

## 2018-02-08 MED ORDER — MORPHINE SULFATE (PF) 4 MG/ML IV SOLN
4.0000 mg | Freq: Once | INTRAVENOUS | Status: AC
  Administered 2018-02-08: 4 mg via INTRAVENOUS

## 2018-02-08 MED ORDER — TECHNETIUM TC 99M MEBROFENIN IV KIT
5.0000 | PACK | Freq: Once | INTRAVENOUS | Status: AC | PRN
  Administered 2018-02-08: 5 via INTRAVENOUS

## 2018-02-08 NOTE — Progress Notes (Addendum)
Daily Rounding Note  02/08/2018, 8:39 AM  LOS: 0 days   SUBJECTIVE:   Chief complaint:  Still having pp pain.     Ok when she was taking clears, but recurrent sxs after solids yesterday BM last night during episode of pain  OBJECTIVE:         Vital signs in last 24 hours:    Temp:  [97.9 F (36.6 C)-99.4 F (37.4 C)] 98.7 F (37.1 C) (10/09 0552) Pulse Rate:  [75-97] 84 (10/09 0554) Resp:  [12-17] 17 (10/08 2035) BP: (87-124)/(53-84) 93/65 (10/09 0554) SpO2:  [94 %-99 %] 96 % (10/09 0554) Weight:  [119.2 kg-120 kg] 119.2 kg (10/08 1827) Last BM Date: 02/05/18 Filed Weights   02/05/18 2112 02/07/18 1103 02/07/18 1827  Weight: 120.2 kg 120 kg 119.2 kg   General: looks well   Heart: RRR Chest: clear bil. Abdomen: soft, slight epigastric pain  Extremities: no CCE Neuro/Psych:  Oriented x 3.  Calm, cooperative, pleasant, good historian.  No gross weakness.    Intake/Output from previous day: 10/08 0701 - 10/09 0700 In: 73419.3 [I.V.:820.6; IV Piggyback:45895] Out: 0   Intake/Output this shift: No intake/output data recorded.  Lab Results: Recent Labs    02/05/18 1401 02/06/18 0649 02/07/18 0742 02/07/18 1616 02/08/18 0000 02/08/18 0711  WBC 8.3 8.7 9.8  --   --   --   HGB 13.3 13.5 12.8 12.7 12.5 12.5  HCT 41.1 41.7 39.2 40.3 38.8 39.5  PLT 291 293 262  --   --   --    BMET Recent Labs    02/05/18 1401 02/06/18 0649 02/07/18 0742  NA 138 135 137  K 4.0 4.4 4.1  CL 103 106 104  CO2 27 24 25   GLUCOSE 111* 84 90  BUN 7 5* 6  CREATININE 0.75 0.73 0.76  CALCIUM 9.1 8.4* 9.1   LFT Recent Labs    02/05/18 1401 02/06/18 0649 02/07/18 0742  PROT 7.6 7.8 7.6  ALBUMIN 3.0* 3.0* 2.9*  AST 287* 244* 124*  ALT 227* 243* 172*  ALKPHOS 684* 727* 661*  BILITOT 1.9* 1.6* 1.3*   PT/INR Recent Labs    02/05/18 1820  LABPROT 12.7  INR 0.96   Hepatitis Panel Recent Labs    02/06/18 0928    HEPBSAG Negative  HCVAB <0.1  HEPAIGM Negative  HEPBIGM Negative    Studies/Results: Mr 3d Recon At Scanner  Result Date: 02/06/2018 CLINICAL DATA:  Abnormal liver function tests.  Abdominal pain. EXAM: MRI ABDOMEN WITHOUT AND WITH CONTRAST (INCLUDING MRCP) TECHNIQUE: Multiplanar multisequence MR imaging of the abdomen was performed both before and after the administration of intravenous contrast. Heavily T2-weighted images of the biliary and pancreatic ducts were obtained, and three-dimensional MRCP images were rendered by post processing. CONTRAST:  10 cc Gadavist COMPARISON:  02/05/2018 abdominal ultrasound. This demonstrated hyperechoic liver lesions. FINDINGS: Portions of exam are mild to moderately motion degraded. Lower chest: Normal heart size without pericardial or pleural effusion. Hepatobiliary: Abnormal appearance of the liver, with central areas of tubular mixed T2 signal, highly suspicious for dilated intrahepatic ducts with ductal calculi. Example images 14/8, 13/4, 10/14, and 44/12. These are most conspicuous within the left hepatic lobe. Somewhat more masslike peripheral lateral segment left liver lobe T2 hyperintensity, including on image 15/4, measures 1.7 cm and is favored to represent focal intrahepatic duct dilatation. Similarly, T2 hypointensity at 1.8 cm in the right hepatic lobe on 16/8 is favored  to be related to focal intrahepatic duct dilatation and intrahepatic ductal stones within. No gallstones or common duct dilatation. The common duct is upper normal in size for age at 7 mm. Pancreas:  Normal, without mass or ductal dilatation. Spleen: Nonenhancing anterior splenic lesion measures 8.0 x 6.1 cm on image 12/4. Peripheral T2 hypointensity with heterogeneous central T2 signal. No post-contrast enhancement. Smaller nonspecific T2 hyperintense posterior splenic lesions. No splenomegaly. Adrenals/Urinary Tract: Normal adrenal glands. Normal kidneys, without hydronephrosis.  Stomach/Bowel: Grossly normal stomach and abdominal bowel loops. Vascular/Lymphatic: Normal caliber of the aorta and branch vessels. Prominent porta hepatis nodes are likely reactive, not pathologic by size criteria. Example 11 mm. Other:  No ascites. Musculoskeletal: No acute osseous abnormality. IMPRESSION: 1. Portions of exam are mild to moderately motion degraded. 2. Diffusely abnormal appearance of the liver, worse on the left. Favored to represent intrahepatic biliary duct dilatation with numerous ductal calculi. 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 Differential considerations include primary sclerosing cholangitis, HIV cholangiopathy. Given the difficulty with visualization of intrahepatic calculi at MRI, and motion on the current exam, liver protocol CT may be informative. 3. Heterogeneous splenic lesion is favored to represent the sequelae of prior trauma (chronic hematoma) or infection. Recommend attention on follow-up. 4. Mildly prominent porta hepatis nodes, favored to be reactive. These results will be called to the ordering clinician or representative by the Radiologist Assistant, and communication documented in the PACS or zVision Dashboard. Electronically Signed   By: Abigail Miyamoto M.D.   On: 02/06/2018 14:03   Mr Abdomen Mrcp Moise Boring Contast  Result Date: 02/06/2018 CLINICAL DATA:  Abnormal liver function tests.  Abdominal pain. EXAM: MRI ABDOMEN WITHOUT AND WITH CONTRAST (INCLUDING MRCP) TECHNIQUE: Multiplanar multisequence MR imaging of the abdomen was performed both before and after the administration of intravenous contrast. Heavily T2-weighted images of the biliary and pancreatic ducts were obtained, and three-dimensional MRCP images were rendered by post processing. CONTRAST:  10 cc Gadavist COMPARISON:  02/05/2018 abdominal ultrasound. This demonstrated hyperechoic liver lesions. FINDINGS: Portions of exam are mild to moderately motion degraded.  Lower chest: Normal heart size without pericardial or pleural effusion. Hepatobiliary: Abnormal appearance of the liver, with central areas of tubular mixed T2 signal, highly suspicious for dilated intrahepatic ducts with ductal calculi. Example images 14/8, 13/4, 10/14, and 44/12. These are most conspicuous within the left hepatic lobe. Somewhat more masslike peripheral lateral segment left liver lobe T2 hyperintensity, including on image 15/4, measures 1.7 cm and is favored to represent focal intrahepatic duct dilatation. Similarly, T2 hypointensity at 1.8 cm in the right hepatic lobe on 16/8 is favored to be related to focal intrahepatic duct dilatation and intrahepatic ductal stones within. No gallstones or common duct dilatation. The common duct is upper normal in size for age at 7 mm. Pancreas:  Normal, without mass or ductal dilatation. Spleen: Nonenhancing anterior splenic lesion measures 8.0 x 6.1 cm on image 12/4. Peripheral T2 hypointensity with heterogeneous central T2 signal. No post-contrast enhancement. Smaller nonspecific T2 hyperintense posterior splenic lesions. No splenomegaly. Adrenals/Urinary Tract: Normal adrenal glands. Normal kidneys, without hydronephrosis. Stomach/Bowel: Grossly normal stomach and abdominal bowel loops. Vascular/Lymphatic: Normal caliber of the aorta and branch vessels. Prominent porta hepatis nodes are likely reactive, not pathologic by size criteria. Example 11 mm. Other:  No ascites. Musculoskeletal: No acute osseous abnormality. IMPRESSION: 1. Portions of exam are mild to moderately motion degraded. 2. Diffusely abnormal appearance of the liver, worse on the left. Favored to represent  intrahepatic biliary duct dilatation with numerous ductal calculi. 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 Differential considerations include primary sclerosing cholangitis, HIV cholangiopathy. Given the difficulty with visualization of  intrahepatic calculi at MRI, and motion on the current exam, liver protocol CT may be informative. 3. Heterogeneous splenic lesion is favored to represent the sequelae of prior trauma (chronic hematoma) or infection. Recommend attention on follow-up. 4. Mildly prominent porta hepatis nodes, favored to be reactive. These results will be called to the ordering clinician or representative by the Radiologist Assistant, and communication documented in the PACS or zVision Dashboard. Electronically Signed   By: Abigail Miyamoto M.D.   On: 02/06/2018 14:03   Ct Liver Abdomen W Wo Contrast  Result Date: 02/06/2018 CLINICAL DATA:  Abnormal liver enzymes in a cholestatic pattern EXAM: CT ABDOMEN AND PELVIS WITHOUT AND WITH CONTRAST TECHNIQUE: Multidetector CT imaging of the abdomen and pelvis was performed following the standard protocol before and following the bolus administration of intravenous contrast. CONTRAST:  148mL ISOVUE-370 IOPAMIDOL (ISOVUE-370) INJECTION 76% COMPARISON:  02/06/2018 FINDINGS: Lower chest: Atelectasis in the lingula and right middle lobe. Airway thickening. Hepatobiliary: Again noted are irregular bile ducts especially in the left hepatic lobe with some areas of cystic dilatation probably in continuity with the bile duct as on image 29/10. On MRI there were some low signal intensity filling defects in some of these areas suspicious for gallstones; these have relatively intermediate density on CT, and are not calcified or well appreciated does gallstones on CT, but the appearance was very compelling at for gallstones on the MRI. There are other large filling defects in the biliary tree on MRI that are less conspicuous on CT. I do not appreciate that the contents distending the biliary tree were enhancing on the subtraction images from the MRI, and accordingly intraductal tumor seems less likely. Gallbladder unremarkable. No appreciable portal vein thrombosis or patent vein thrombosis. No pneumobilia.  Pancreas: Unremarkable Spleen: Large rim calcified splenic cystic lesion 8.7 by 7.0 cm, without appreciable internal enhancement on CT or on the subtraction images from prior MRI. Several additional small hypodense lesions are present in the spleen posterior to this dominant cystic lesion. Adrenals/Urinary Tract: Unremarkable Stomach/Bowel: Unremarkable Vascular/Lymphatic: Multiple small porta hepatis lymph nodes are present. Reproductive: An IUD is present in the uterus. 4.6 cm fibroid in the uterine fundus. Ovaries unremarkable. Other: 2.9 by 2.8 cm focus of omental fat necrosis in the left lower quadrant on image 80/10 is likely chronic and has rim calcification. Musculoskeletal: Unremarkable IMPRESSION: 1. Irregular bile ducts with numerous small internal filling defects shown on recent MRI. These filling defects are intermediate density but demonstrate heterogeneity against the background biliary fluid density especially involving the left intrahepatic biliary tree. There are dilated portions and irregular portions favoring cholangitis potentially in conjunction with extensive intrahepatic stone formation. Potentially debris could have a similar appearance in the biliary tree. I do not see abnormal enhancement in the biliary tree to suggest biliary tumor on the recent MRI. Primary sclerosing cholangitis would be a top differential diagnostic consideration. 2. Large rim calcified splenic lesion, likely a postinflammatory or posttraumatic lesion. Smaller hypodense lesions along its posterior margin are likewise probably related to the initial insult. Electronically Signed   By: Van Clines M.D.   On: 02/06/2018 21:04   Scheduled Meds: . enoxaparin (LOVENOX) injection  40 mg Subcutaneous Q24H  . loratadine  10 mg Oral Daily  . mometasone-formoterol  2 puff Inhalation BID  . montelukast  10 mg Oral QHS  . pantoprazole  40 mg Oral BID  . sodium chloride flush  3 mL Intravenous Q12H  . sucralfate  1 g  Oral TID WC & HS   Continuous Infusions: PRN Meds:.albuterol, ondansetron (ZOFRAN) IV, oxyCODONE   ASSESMENT:   *   Post prandial epigastric pain.   10/8 EGD: gastritis, gastric erosion.   Protonix BID, Carafate in place.  Pathology collected, pending.    *  Abnormal LFTs, chronic.  Abnormal appearance bile ducts per CT.  R/o concomitant hepatocellular disease.   IgG 1849 elevated IgM 249, elevated IgA 294, normal AMA < 20, normal.   C-ANCA titer <1:20 P-ANCA titer <1:20 Acute hepatitis panel normal.   ANA pending   PLAN   *   HIDA scan planned today ~ 3 PM    *   Resume po after HIDA, ? Clears vs low fat vs full liquids?      Azucena Freed  02/08/2018, 8:39 AM Phone [phone]

## 2018-02-08 NOTE — Progress Notes (Signed)
Progress Note    Monique Harrison  HER:[id number] DOB: Sep 28, 1975  DOA: 02/05/2018 PCP: Patient, No Pcp Per    Brief Narrative:     Medical records reviewed and are as summarized below:  Monique Harrison is an 42 y.o. female  with medical history significant of asthma, allergies, and obesity who presented with abdominal pain.  She does not smoke drink or use any recreational drugs.  2 weeks ago she developed epigastric pain thought to be indigestion.  She took baking soda and water which was unhelpful to relieve her pain permanently her pain continue to return every few days.  It was dull and epigastric.  She was noted to have elevated LFTs at admission in the emergency department.  GGT was markedly elevated.  An MRI and MRA was ordered and found to be markedly abnormal suggestive of primary biliary cirrhosis, hepatitis, chronic pyogenic cholangitis or hepatic steatosis.  Evaluation by GI-- see recs below  Assessment/Plan:   Principal Problem:   Gastritis with hemorrhage Active Problems:   Abdominal pain   Primary sclerosing cholangitis   Hypotension   Hemorrhagic gastritis    Abdominal pain with gastritis with hemorrhage: Course: presents with multi-week hx of abdominal pain aggravated by PO intake, found to have hepatic steatosis on imaging, AP of 684, AST/ALT in 200s, T bili of 1.9. A/P: EGD revealed gastritis with hemorrhage. Proton pump inhibitor is increased to twice daily.  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 Awaiting gastric biopsy results. HIDA scan today, although interpretation may be difficult in the setting of chronic liver disease Diet advance as tolerated  after the HIDA scan Outpatient referral to [facility] (patient request). Would defer consideration of liver biopsy to that setting.   Possible primary sclerosing cholangitis: FTs although markedly elevated now have been slowly creeping up over a period of years.  Patient has expressed an interest in following up at [facility] hepatology center for this.  Dr. Tarri Glenn is assisting in arranging this.   Xanthelasmas: -LDL 219 -will eventually need statin  Chronic allergies:continue home medications  Asthma:not in exacerbation, has chronic wheezing at baseline, on room air, continue Advair equivalent while in house, bronchodilators PRN  obesity Body mass index is 42.42 kg/m.   Family Communication/Anticipated D/C date and plan/Code Status   DVT prophylaxis: scd Code Status: partial  Family Communication: none Disposition Plan:    Medical Consultants:    GI   Anti-Infectives:    None  Subjective:   C/o left sided pain in abdomen  Objective:    Vitals:   02/07/18 2035 02/08/18 0552 02/08/18 0554 02/08/18 0842  BP: 122/75 95/67 93/65    Pulse: 97  84   Resp: 17     Temp: 99.4 F (37.4 C) 98.7 F (37.1 C)    TempSrc:      SpO2: 94%  96% 94%  Weight:      Height:        Intake/Output Summary (Last 24 hours) at 02/08/2018 1300 Last data filed at 02/07/2018 1728 Gross per 24 hour  Intake 45895 ml  Output -  Net 45895 ml   Filed Weights   02/05/18 2112 02/07/18 1103 02/07/18  1827  Weight: 120.2 kg 120 kg 119.2 kg    Exam: In bed, NAD rrr No increased work of breathing Min LE edema Obese female Alert and oriented Data Reviewed:   I have personally reviewed following labs and imaging studies:  Labs: Labs show the following:   Basic Metabolic Panel: Recent Labs  Lab 02/05/18 1401 02/06/18 0649 02/07/18 0742  NA 138 135 137  K 4.0 4.4 4.1  CL 103 106 104  CO2 27 24 25   GLUCOSE 111* 84 90  BUN 7 5* 6  CREATININE 0.75 0.73 0.76    CALCIUM 9.1 8.4* 9.1   GFR Estimated Creatinine Clearance: 120.5 mL/min (by C-G formula based on SCr of 0.76 mg/dL). Liver Function Tests: Recent Labs  Lab 02/05/18 1401 02/06/18 0649 02/07/18 0742  AST 287* 244* 124*  ALT 227* 243* 172*  ALKPHOS 684* 727* 661*  BILITOT 1.9* 1.6* 1.3*  PROT 7.6 7.8 7.6  ALBUMIN 3.0* 3.0* 2.9*   Recent Labs  Lab 02/05/18 1401  LIPASE 29   No results for input(s): AMMONIA in the last 168 hours. Coagulation profile Recent Labs  Lab 02/05/18 1820  INR 0.96    CBC: Recent Labs  Lab 02/05/18 1401 02/06/18 0649 02/07/18 0742 02/07/18 1616 02/08/18 0000 02/08/18 0711  WBC 8.3 8.7 9.8  --   --   --   NEUTROABS  --  5.0 7.0  --   --   --   HGB 13.3 13.5 12.8 12.7 12.5 12.5  HCT 41.1 41.7 39.2 40.3 38.8 39.5  MCV 93.0 93.1 92.2  --   --   --   PLT 291 293 262  --   --   --    Cardiac Enzymes: No results for input(s): CKTOTAL, CKMB, CKMBINDEX, TROPONINI in the last 168 hours. BNP (last 3 results) No results for input(s): PROBNP in the last 8760 hours. CBG: No results for input(s): GLUCAP in the last 168 hours. D-Dimer: No results for input(s): DDIMER in the last 72 hours. Hgb A1c: Recent Labs    02/05/18 1401  HGBA1C 4.9   Lipid Profile: Recent Labs    02/05/18 1401  CHOL 292*  HDL 53  LDLCALC 219*  TRIG 98  CHOLHDL 5.5   Thyroid function studies: No results for input(s): TSH, T4TOTAL, T3FREE, THYROIDAB in the last 72 hours.  Invalid input(s): FREET3 Anemia work up: No results for input(s): VITAMINB12, FOLATE, FERRITIN, TIBC, IRON, RETICCTPCT in the last 72 hours. Sepsis Labs: Recent Labs  Lab 02/05/18 1401 02/06/18 0649 02/07/18 0742  WBC 8.3 8.7 9.8    Microbiology No results found for this or any previous visit (from the past 240 hour(s)).  Procedures and diagnostic studies:  Mr 3d Recon At Scanner  Result Date: 02/06/2018 CLINICAL DATA:  Abnormal liver function tests.  Abdominal pain. EXAM: MRI  ABDOMEN WITHOUT AND WITH CONTRAST (INCLUDING MRCP) TECHNIQUE: Multiplanar multisequence MR imaging of the abdomen was performed both before and after the administration of intravenous contrast. Heavily T2-weighted images of the biliary and pancreatic ducts were obtained, and three-dimensional MRCP images were rendered by post processing. CONTRAST:  10 cc Gadavist COMPARISON:  02/05/2018 abdominal ultrasound. This demonstrated hyperechoic liver lesions. FINDINGS: Portions of exam are mild to moderately motion degraded. Lower chest: Normal heart size without pericardial or pleural effusion. Hepatobiliary: Abnormal appearance of the liver, with central areas of tubular mixed T2 signal, highly suspicious for dilated intrahepatic ducts with ductal calculi. Example images 14/8, 13/4, 10/14, and 44/12. These  are most conspicuous within the left hepatic lobe. Somewhat more masslike peripheral lateral segment left liver lobe T2 hyperintensity, including on image 15/4, measures 1.7 cm and is favored to represent focal intrahepatic duct dilatation. Similarly, T2 hypointensity at 1.8 cm in the right hepatic lobe on 16/8 is favored to be related to focal intrahepatic duct dilatation and intrahepatic ductal stones within. No gallstones or common duct dilatation. The common duct is upper normal in size for age at 7 mm. Pancreas:  Normal, without mass or ductal dilatation. Spleen: Nonenhancing anterior splenic lesion measures 8.0 x 6.1 cm on image 12/4. Peripheral T2 hypointensity with heterogeneous central T2 signal. No post-contrast enhancement. Smaller nonspecific T2 hyperintense posterior splenic lesions. No splenomegaly. Adrenals/Urinary Tract: Normal adrenal glands. Normal kidneys, without hydronephrosis. Stomach/Bowel: Grossly normal stomach and abdominal bowel loops. Vascular/Lymphatic: Normal caliber of the aorta and branch vessels. Prominent porta hepatis nodes are likely reactive, not pathologic by size criteria. Example  11 mm. Other:  No ascites. Musculoskeletal: No acute osseous abnormality. IMPRESSION: 1. Portions of exam are mild to moderately motion degraded. 2. Diffusely abnormal appearance of the liver, worse on the left. Favored to represent intrahepatic biliary duct dilatation with numerous ductal calculi. 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 Differential considerations include primary sclerosing cholangitis, HIV cholangiopathy. Given the difficulty with visualization of intrahepatic calculi at MRI, and motion on the current exam, liver protocol CT may be informative. 3. Heterogeneous splenic lesion is favored to represent the sequelae of prior trauma (chronic hematoma) or infection. Recommend attention on follow-up. 4. Mildly prominent porta hepatis nodes, favored to be reactive. These results will be called to the ordering clinician or representative by the Radiologist Assistant, and communication documented in the PACS or zVision Dashboard. Electronically Signed   By: Abigail Miyamoto M.D.   On: 02/06/2018 14:03   Mr Abdomen Mrcp Moise Boring Contast  Result Date: 02/06/2018 CLINICAL DATA:  Abnormal liver function tests.  Abdominal pain. EXAM: MRI ABDOMEN WITHOUT AND WITH CONTRAST (INCLUDING MRCP) TECHNIQUE: Multiplanar multisequence MR imaging of the abdomen was performed both before and after the administration of intravenous contrast. Heavily T2-weighted images of the biliary and pancreatic ducts were obtained, and three-dimensional MRCP images were rendered by post processing. CONTRAST:  10 cc Gadavist COMPARISON:  02/05/2018 abdominal ultrasound. This demonstrated hyperechoic liver lesions. FINDINGS: Portions of exam are mild to moderately motion degraded. Lower chest: Normal heart size without pericardial or pleural effusion. Hepatobiliary: Abnormal appearance of the liver, with central areas of tubular mixed T2 signal, highly suspicious for dilated intrahepatic ducts with  ductal calculi. Example images 14/8, 13/4, 10/14, and 44/12. These are most conspicuous within the left hepatic lobe. Somewhat more masslike peripheral lateral segment left liver lobe T2 hyperintensity, including on image 15/4, measures 1.7 cm and is favored to represent focal intrahepatic duct dilatation. Similarly, T2 hypointensity at 1.8 cm in the right hepatic lobe on 16/8 is favored to be related to focal intrahepatic duct dilatation and intrahepatic ductal stones within. No gallstones or common duct dilatation. The common duct is upper normal in size for age at 7 mm. Pancreas:  Normal, without mass or ductal dilatation. Spleen: Nonenhancing anterior splenic lesion measures 8.0 x 6.1 cm on image 12/4. Peripheral T2 hypointensity with heterogeneous central T2 signal. No post-contrast enhancement. Smaller nonspecific T2 hyperintense posterior splenic lesions. No splenomegaly. Adrenals/Urinary Tract: Normal adrenal glands. Normal kidneys, without hydronephrosis. Stomach/Bowel: Grossly normal stomach and abdominal bowel loops. Vascular/Lymphatic: Normal caliber of the aorta and branch  vessels. Prominent porta hepatis nodes are likely reactive, not pathologic by size criteria. Example 11 mm. Other:  No ascites. Musculoskeletal: No acute osseous abnormality. IMPRESSION: 1. Portions of exam are mild to moderately motion degraded. 2. Diffusely abnormal appearance of the liver, worse on the left. Favored to represent intrahepatic biliary duct dilatation with numerous ductal calculi. 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 Differential considerations include primary sclerosing cholangitis, HIV cholangiopathy. Given the difficulty with visualization of intrahepatic calculi at MRI, and motion on the current exam, liver protocol CT may be informative. 3. Heterogeneous splenic lesion is favored to represent the sequelae of prior trauma (chronic hematoma) or infection. Recommend  attention on follow-up. 4. Mildly prominent porta hepatis nodes, favored to be reactive. These results will be called to the ordering clinician or representative by the Radiologist Assistant, and communication documented in the PACS or zVision Dashboard. Electronically Signed   By: Abigail Miyamoto M.D.   On: 02/06/2018 14:03   Ct Liver Abdomen W Wo Contrast  Result Date: 02/06/2018 CLINICAL DATA:  Abnormal liver enzymes in a cholestatic pattern EXAM: CT ABDOMEN AND PELVIS WITHOUT AND WITH CONTRAST TECHNIQUE: Multidetector CT imaging of the abdomen and pelvis was performed following the standard protocol before and following the bolus administration of intravenous contrast. CONTRAST:  187mL ISOVUE-370 IOPAMIDOL (ISOVUE-370) INJECTION 76% COMPARISON:  02/06/2018 FINDINGS: Lower chest: Atelectasis in the lingula and right middle lobe. Airway thickening. Hepatobiliary: Again noted are irregular bile ducts especially in the left hepatic lobe with some areas of cystic dilatation probably in continuity with the bile duct as on image 29/10. On MRI there were some low signal intensity filling defects in some of these areas suspicious for gallstones; these have relatively intermediate density on CT, and are not calcified or well appreciated does gallstones on CT, but the appearance was very compelling at for gallstones on the MRI. There are other large filling defects in the biliary tree on MRI that are less conspicuous on CT. I do not appreciate that the contents distending the biliary tree were enhancing on the subtraction images from the MRI, and accordingly intraductal tumor seems less likely. Gallbladder unremarkable. No appreciable portal vein thrombosis or patent vein thrombosis. No pneumobilia. Pancreas: Unremarkable Spleen: Large rim calcified splenic cystic lesion 8.7 by 7.0 cm, without appreciable internal enhancement on CT or on the subtraction images from prior MRI. Several additional small hypodense lesions are  present in the spleen posterior to this dominant cystic lesion. Adrenals/Urinary Tract: Unremarkable Stomach/Bowel: Unremarkable Vascular/Lymphatic: Multiple small porta hepatis lymph nodes are present. Reproductive: An IUD is present in the uterus. 4.6 cm fibroid in the uterine fundus. Ovaries unremarkable. Other: 2.9 by 2.8 cm focus of omental fat necrosis in the left lower quadrant on image 80/10 is likely chronic and has rim calcification. Musculoskeletal: Unremarkable IMPRESSION: 1. Irregular bile ducts with numerous small internal filling defects shown on recent MRI. These filling defects are intermediate density but demonstrate heterogeneity against the background biliary fluid density especially involving the left intrahepatic biliary tree. There are dilated portions and irregular portions favoring cholangitis potentially in conjunction with extensive intrahepatic stone formation. Potentially debris could have a similar appearance in the biliary tree. I do not see abnormal enhancement in the biliary tree to suggest biliary tumor on the recent MRI. Primary sclerosing cholangitis would be a top differential diagnostic consideration. 2. Large rim calcified splenic lesion, likely a postinflammatory or posttraumatic lesion. Smaller hypodense lesions along its posterior margin are likewise probably related  to the initial insult. Electronically Signed   By: Van Clines M.D.   On: 02/06/2018 21:04    Medications:   . enoxaparin (LOVENOX) injection  40 mg Subcutaneous Q24H  . loratadine  10 mg Oral Daily  . LORazepam  0.5 mg Intravenous Once  . mometasone-formoterol  2 puff Inhalation BID  . montelukast  10 mg Oral QHS  . pantoprazole  40 mg Oral BID  . sodium chloride flush  3 mL Intravenous Q12H  . sucralfate  1 g Oral TID WC & HS   Continuous Infusions:   LOS: 0 days   Geradine Girt  [facility]   *Please refer to [email or web address], password [email or web address] to get updated schedule on who will  round on this patient, as hospitalists switch teams weekly. If 7PM-7AM, please contact night-coverage at [email or web address], password [email or web address] for any overnight needs.  02/08/2018, 1:00 PM

## 2018-02-08 NOTE — Consult Note (Addendum)
[name]: 2:51 PM 02/08/2018  LOS: 0 days    Referring Provider: Dr. Evangeline Gula Primary Care Physician:  Patient, No Pcp Per Primary Gastroenterologist: Patient is unassigned    Reason for Consultation: Abormal LFTs and abnormal MRCP   HPI: Monique Harrison is a 42 y.o. female.  Hx GI bleed in 2016.  Bleeding attributed to frequent Naprosyn though pt recalls no lesions on EGD or colonoscopy in [place].  History of abnormal LFTs in the mid to thousands, could have been 2005 or so.  She was referred to a GI doctor who obtained multiple blood tests and performed MRI, possibly MRCP, after that she had no further testing.  She does not recall being told the etiology of her abnormal LFTs but they have been persistently elevated for many years.  Asthma.  Early last week she had some vague, moderate pain in the left flank.  Around Wednesday after eating she developed acute epigastric pain with nausea, she felt it was heartburn.  She took some baking soda mixed with water and the symptoms passed after about 3 hours.  The same pain recurred on Friday after eating, while she was at work and she got Mylanta and Alka-Seltzer.  The symptoms passed in few hours.  Saturday when she woke up the pain was still there and it was intermittent all day long.  It was still there on Sunday so she came to the emergency department.  The pain is primarily in the epigastrium but does not radiate.  On exam she has tenderness both there and in the right upper quadrant.  As long as she does not eat she does not have the pain.  Although there is been some intermittent nausea, she has not vomited.  Pain triggered by trying to take a deep breath.  Does like there is a bubble in her epigastrium. This 400 to 600 mg of ibuprofen daily a few  times every month for sinus congestion and headaches.  Normally has a good appetite with stable weight and no issues with food consumption.  Lipase 29.  T bili 1.9.  Alkaline phosphatase 727.  AST/ALT 287/227.  GGT 1,157. WBCs 12.9.  Hgb 13.3.  MCV 94.  Platelets 304K.   HIV screening nonreactive. Abdominal ultrasound:   Hepatic steatosis.  2 hyperechoic hepatic lesions, likely reflect benign hemangiomas although incompletely characterized.  Consider MRI abdomen in 3 months.  CBD 6 mm.  Portal vein Doppler studies normal MRCP:   Somewhat motion degraded exam.  Diffusely abnormal liver, particularly on the left.  Changes favoring intrahepatic biliary ductal dilatation with numerous ductal calculi findings of which are suspicious for recurrent pyogenic cholangiohepatitis versus primary sclerosing cholangitis versus HIV cholangiopathy.  Region difficult to visualize and liver protocol CT may be informative.  Patient works as a Product/process development scientist, she is a Education officer, museum.  She does not drink alcohol. Family history negative for autoimmune disease.  Past Medical History:  Diagnosis Date  . Abnormal LFTs 2005  . Asthma   . Recurrent upper respiratory infection (URI)   . Upper  GI bleed 2016   [place]; presented with melena; admitted for 1 week; thought to be due to frequent Naproxen use  . Vitamin D deficiency     Past Surgical History:  Procedure Laterality Date  . NO PAST SURGERIES      Prior to Admission medications   Medication Sig Start Date End Date Taking? Authorizing Provider  albuterol (PROVENTIL HFA;VENTOLIN HFA) 108 (90 Base) MCG/ACT inhaler Inhale 2 puffs into the lungs every 4 (four) hours as needed for wheezing or shortness of breath. 08/29/17  Yes Padgett, Rae Halsted, MD  albuterol (PROVENTIL) (2.5 MG/3ML) 0.083% nebulizer solution Take 3 mLs (2.5 mg total) by nebulization every 4 (four) hours as needed for wheezing or shortness of breath. 08/29/17  Yes Padgett, Rae Halsted, MD  aspirin EC 325 MG tablet Take 325 mg by mouth daily as needed (pain/headache).   Yes [provider]  cetirizine-pseudoephedrine (ZYRTEC-D) 5-120 MG tablet Take 1 tablet by mouth See admin instructions. Take 1 tablet of either Zyrtec-D or Allegra-D by mouth twice daily   Yes [provider]  Cholecalciferol (VITAMIN D3) 3000 units TABS Take 3,000 Units by mouth daily.   Yes [provider]  fexofenadine-pseudoephedrine (ALLEGRA-D) 60-120 MG 12 hr tablet Take 1 tablet by mouth See admin instructions. Take 1 tablet of either Allegra-D or Zyrtec-D by mouth twice daily   Yes [provider]  Fluticasone-Salmeterol (ADVAIR) 500-50 MCG/DOSE AEPB Inhale 1 puff into the lungs 2 (two) times daily. 08/29/17  Yes Kennith Gain, MD  MAGNESIUM PO Take 1 tablet by mouth daily.   Yes [provider]  montelukast (SINGULAIR) 10 MG tablet Take 1 tablet (10 mg total) by mouth at bedtime. 08/29/17  Yes Padgett, Rae Halsted, MD  azelastine (ASTELIN) 0.1 % nasal spray Place 2 sprays into both nostrils 2 (two) times daily. Patient not taking: Reported on 02/05/2018 08/29/17   Kennith Gain, MD  cetirizine (ZYRTEC ALLERGY) 10 MG tablet Take 1 tablet (10 mg total) by mouth daily. Patient not taking: Reported on 02/05/2018 02/21/17   Zigmund Gottron, NP  fluticasone (FLOVENT HFA) 220 MCG/ACT inhaler Inhale 2 puffs into the lungs 2 (two) times daily. Patient not taking: Reported on 02/05/2018 08/29/17   Kennith Gain, MD    Scheduled Meds: . enoxaparin (LOVENOX) injection  40 mg Subcutaneous Q24H  . loratadine  10 mg Oral Daily  . LORazepam  0.5 mg Intravenous Once  . mometasone-formoterol  2 puff Inhalation BID  . montelukast  10 mg Oral QHS  . pantoprazole  40 mg Oral BID  . sodium chloride flush  3 mL Intravenous Q12H  . sucralfate  1 g Oral TID WC & HS   Infusions:  PRN Meds: albuterol, ondansetron (ZOFRAN) IV,  oxyCODONE   Allergies as of 02/05/2018 - Review Complete 02/05/2018  Allergen Reaction Noted  . Aspirin Shortness Of Breath 02/01/2017    Family History  Problem Relation Age of Onset  . Allergic rhinitis Father   . Breast cancer Maternal Grandmother     Social History   Socioeconomic History  . Marital status: Divorced    Spouse name: Not on file  . Number of children: Not on file  . Years of education: Not on file  . Highest education level: Not on file  Occupational History  . Not on file  Social Needs  . Financial resource strain: Not on file  . Food insecurity:    Worry: Not on file  Inability: Not on file  . Transportation needs:    Medical: Not on file    Non-medical: Not on file  Tobacco Use  . Smoking status: Never Smoker  . Smokeless tobacco: Never Used  Substance and Sexual Activity  . Alcohol use: No  . Drug use: No  . Sexual activity: Yes  Lifestyle  . Physical activity:    Days per week: Not on file    Minutes per session: Not on file  . Stress: Not on file  Relationships  . Social connections:    Talks on phone: Not on file    Gets together: Not on file    Attends religious service: Not on file    Active member of club or organization: Not on file    Attends meetings of clubs or organizations: Not on file    Relationship status: Not on file  . Intimate partner violence:    Fear of current or ex partner: Not on file    Emotionally abused: Not on file    Physically abused: Not on file    Forced sexual activity: Not on file  Other Topics Concern  . Not on file  Social History Narrative  . Not on file    REVIEW OF SYSTEMS: Constitutional: Patient feels a bit tired but has no profound fatigue. ENT:  No nose bleeds Pulm: No recent asthma attacks.  Stable cough, stable, non-severe dyspnea with exertion. CV:  No palpitations, no LE edema.  Chest pain GU:  No hematuria, no frequency.  No amber-colored urine. GI: Per HPI. Heme: Usual  bleeding or bruising.  Acholic stools. Transfusions: None. Neuro:  No headaches, no peripheral tingling or numbness Derm: Does have itching in her palms.  Never had jaundice that she is aware of. No itching, no rash or sores.  Patient has professionally applied tattoos starting in 1996, the last tattoo was in 2016. Endocrine:  No sweats or chills.  No polyuria or dysuria Immunization: Not queried. Travel:  None beyond local counties in last few months.    PHYSICAL EXAM: Vital signs in last 24 hours: Vitals:   02/08/18 0554 02/08/18 0842  BP: 93/65   Pulse: 84   Resp:    Temp:    SpO2: 96% 94%   Wt Readings from Last 3 Encounters:  02/07/18 119.2 kg  12/25/17 124.7 kg  05/13/17 122 kg    General: Obese, comfortable, well-appearing AAF Head: Facial asymmetry or swelling.  No signs of head trauma. Eyes: Lateral icterus.  No conjunctival pallor.  Xanthelasmas bil on eyelids.   Ears: Not hard of hearing Nose: It is congestion, no drainage. Mouth: Moist, clear, pink oropharynx.  Tongue midline.  Good dentition. Neck: JVD, no thyromegaly, no masses. Lungs: No labored breathing.  Slight loose sounding cough.  Intermittent bilateral wheezing. Heart: RRR.  No MRG.  S1, S2 present. Abdomen: Soft.  Tenderness in the epigastrium and right upper quadrant.  No guarding or rebound.  No HSM, masses, bruits, hernias..   Rectal: Deferred Musc/Skeltl: No significant joint deformities.  No joint erythema or swelling. Extremities: CCE. Neurologic: Alert.  Oriented x3.  No tremors or asterixis.  No limb weakness. Skin: Obvious jaundice. Tattoos: Several, on her left wrist, left upper chest and back.  All professional quality. Psych: Does not, well spoken, cooperative.    Intake/Output from previous day: 10/08 0701 - 10/09 0700 In: 84166.0 [I.V.:820.6; IV Piggyback:45895] Out: 0  Intake/Output this shift: No intake/output data recorded.  LAB RESULTS: Recent Labs  02/06/18 0649  02/07/18 0742 02/07/18 1616 02/08/18 0000 02/08/18 0711  WBC 8.7 9.8  --   --   --   HGB 13.5 12.8 12.7 12.5 12.5  HCT 41.7 39.2 40.3 38.8 39.5  PLT 293 262  --   --   --    BMET Lab Results  Component Value Date   NA 137 02/07/2018   NA 135 02/06/2018   NA 138 02/05/2018   K 4.1 02/07/2018   K 4.4 02/06/2018   K 4.0 02/05/2018   CL 104 02/07/2018   CL 106 02/06/2018   CL 103 02/05/2018   CO2 25 02/07/2018   CO2 24 02/06/2018   CO2 27 02/05/2018   GLUCOSE 90 02/07/2018   GLUCOSE 84 02/06/2018   GLUCOSE 111 (H) 02/05/2018   BUN 6 02/07/2018   BUN 5 (L) 02/06/2018   BUN 7 02/05/2018   CREATININE 0.76 02/07/2018   CREATININE 0.73 02/06/2018   CREATININE 0.75 02/05/2018   CALCIUM 9.1 02/07/2018   CALCIUM 8.4 (L) 02/06/2018   CALCIUM 9.1 02/05/2018   LFT Recent Labs    02/06/18 0649 02/07/18 0742  PROT 7.8 7.6  ALBUMIN 3.0* 2.9*  AST 244* 124*  ALT 243* 172*  ALKPHOS 727* 661*  BILITOT 1.6* 1.3*   PT/INR Lab Results  Component Value Date   INR 0.96 02/05/2018   Hepatitis Panel Recent Labs    02/06/18 0928  HEPBSAG Negative  HCVAB <0.1  HEPAIGM Negative  HEPBIGM Negative   C-Diff No components found for: CDIFF Lipase     Component Value Date/Time   LIPASE 29 02/05/2018 1401    Drugs of Abuse  No results found for: LABOPIA, COCAINSCRNUR, LABBENZ, AMPHETMU, THCU, LABBARB   RADIOLOGY STUDIES: Ct Liver Abdomen W Wo Contrast  Result Date: 02/06/2018 CLINICAL DATA:  Abnormal liver enzymes in a cholestatic pattern EXAM: CT ABDOMEN AND PELVIS WITHOUT AND WITH CONTRAST TECHNIQUE: Multidetector CT imaging of the abdomen and pelvis was performed following the standard protocol before and following the bolus administration of intravenous contrast. CONTRAST:  126mL ISOVUE-370 IOPAMIDOL (ISOVUE-370) INJECTION 76% COMPARISON:  02/06/2018 FINDINGS: Lower chest: Atelectasis in the lingula and right middle lobe. Airway thickening. Hepatobiliary: Again noted are  irregular bile ducts especially in the left hepatic lobe with some areas of cystic dilatation probably in continuity with the bile duct as on image 29/10. On MRI there were some low signal intensity filling defects in some of these areas suspicious for gallstones; these have relatively intermediate density on CT, and are not calcified or well appreciated does gallstones on CT, but the appearance was very compelling at for gallstones on the MRI. There are other large filling defects in the biliary tree on MRI that are less conspicuous on CT. I do not appreciate that the contents distending the biliary tree were enhancing on the subtraction images from the MRI, and accordingly intraductal tumor seems less likely. Gallbladder unremarkable. No appreciable portal vein thrombosis or patent vein thrombosis. No pneumobilia. Pancreas: Unremarkable Spleen: Large rim calcified splenic cystic lesion 8.7 by 7.0 cm, without appreciable internal enhancement on CT or on the subtraction images from prior MRI. Several additional small hypodense lesions are present in the spleen posterior to this dominant cystic lesion. Adrenals/Urinary Tract: Unremarkable Stomach/Bowel: Unremarkable Vascular/Lymphatic: Multiple small porta hepatis lymph nodes are present. Reproductive: An IUD is present in the uterus. 4.6 cm fibroid in the uterine fundus. Ovaries unremarkable. Other: 2.9 by 2.8 cm focus of omental fat necrosis in the left  lower quadrant on image 80/10 is likely chronic and has rim calcification. Musculoskeletal: Unremarkable IMPRESSION: 1. Irregular bile ducts with numerous small internal filling defects shown on recent MRI. These filling defects are intermediate density but demonstrate heterogeneity against the background biliary fluid density especially involving the left intrahepatic biliary tree. There are dilated portions and irregular portions favoring cholangitis potentially in conjunction with extensive intrahepatic stone  formation. Potentially debris could have a similar appearance in the biliary tree. I do not see abnormal enhancement in the biliary tree to suggest biliary tumor on the recent MRI. Primary sclerosing cholangitis would be a top differential diagnostic consideration. 2. Large rim calcified splenic lesion, likely a postinflammatory or posttraumatic lesion. Smaller hypodense lesions along its posterior margin are likewise probably related to the initial insult. Electronically Signed   By: Van Clines M.D.   On: 02/06/2018 21:04     IMPRESSION:   *   Acute epigastric abdominal pain, possibly related to ulcer disease or GERD or the abnormal liver findings  *   Chronically elevated LFTs for several years. MRCP showing diffusely abnormal liver and intrahepatic ductal dilatation with ductal calculi all suspicious for PBC.  She has no history of pyogenic cholangiohepatitis that we know of so this is less likely.  HIV screening nonreactive so HIV cholangiopathy also ruled out  *   Asthma.      PLAN:     *   Ordered multiple lab tests including ANCA, ANA, AMA, IgG, IgM, IgE G subtype 4.    *    Orders placed in the dep[place] for EGD tomorrow.  She can have clears tonight if she so desires but n.p.o. after midnight   Azucena Freed  02/08/2018, 2:51 PM Phone [phone]   Attending Attestation:  I have independently reviewed the history, performed a physical examination and agree with above full consultation, assessment and plan as detailed by PA Gribbin. No family was present at the time of my evaluation:  Longstanding history of unexplained abnormal liver enzymes in a largely cholestatic pattern. Now with acute epigastric pain. Unclear if this is a new process or related to the liver abnormality.  Physical exam, labs, and imaging suggest an intrahepatic biliary process. Will proceed with extensive serologic evaluation. Obtain contrasted CT of the abdomen - liver protocol. Will also proceed  with EGD tomorrow. This would be an atypical presentation for symptomatic gallbladder disease, although this remains in the differential.   Thank for this consultation and for allowing me to participate in this patient's care. Please do not hesitate to call with any questions or concerns. We will continue to follow with you.   Thornton Park, MD, MPH [facility]

## 2018-02-09 ENCOUNTER — Encounter (HOSPITAL_COMMUNITY): Payer: Self-pay | Admitting: Gastroenterology

## 2018-02-09 DIAGNOSIS — R1012 Left upper quadrant pain: Secondary | ICD-10-CM

## 2018-02-09 DIAGNOSIS — D734 Cyst of spleen: Secondary | ICD-10-CM

## 2018-02-09 DIAGNOSIS — K8301 Primary sclerosing cholangitis: Secondary | ICD-10-CM

## 2018-02-09 LAB — COMPREHENSIVE METABOLIC PANEL
ALK PHOS: 518 U/L — AB (ref 38–126)
ALT: 122 U/L — ABNORMAL HIGH (ref 0–44)
ANION GAP: 8 (ref 5–15)
AST: 83 U/L — ABNORMAL HIGH (ref 15–41)
Albumin: 2.8 g/dL — ABNORMAL LOW (ref 3.5–5.0)
BILIRUBIN TOTAL: 3.2 mg/dL — AB (ref 0.3–1.2)
BUN: 5 mg/dL — ABNORMAL LOW (ref 6–20)
CO2: 24 mmol/L (ref 22–32)
Calcium: 8.6 mg/dL — ABNORMAL LOW (ref 8.9–10.3)
Chloride: 103 mmol/L (ref 98–111)
Creatinine, Ser: 0.82 mg/dL (ref 0.44–1.00)
GFR calc Af Amer: >60 mL/min (ref >60)
GLUCOSE: 107 mg/dL — AB (ref 70–99)
POTASSIUM: 3.8 mmol/L (ref 3.5–5.1)
Sodium: 135 mmol/L (ref 135–145)
TOTAL PROTEIN: 8.4 g/dL — AB (ref 6.5–8.1)

## 2018-02-09 LAB — URINALYSIS, ROUTINE W REFLEX MICROSCOPIC
BILIRUBIN URINE: NEGATIVE
GLUCOSE, UA: NEGATIVE mg/dL
Ketones, ur: NEGATIVE mg/dL
NITRITE: NEGATIVE
PH: 6 (ref 5.0–8.0)
Protein, ur: 30 mg/dL — AB
SPECIFIC GRAVITY, URINE: 1.009 (ref 1.005–1.030)
WBC, UA: >50 WBC/hpf — ABNORMAL HIGH (ref 0–5)

## 2018-02-09 LAB — CBC
HCT: 39.3 % (ref 36.0–46.0)
HEMOGLOBIN: 12.4 g/dL (ref 12.0–15.0)
MCH: 28.8 pg (ref 26.0–34.0)
MCHC: 31.6 g/dL (ref 30.0–36.0)
MCV: 91.2 fL (ref 80.0–100.0)
Platelets: 245 10*3/uL (ref 150–400)
RBC: 4.31 MIL/uL (ref 3.87–5.11)
RDW: 13.2 % (ref 11.5–15.5)
WBC: 11.8 10*3/uL — AB (ref 4.0–10.5)
nRBC: 0 % (ref 0.0–0.2)

## 2018-02-09 LAB — BILIRUBIN, DIRECT: BILIRUBIN DIRECT: 1.7 mg/dL — AB (ref 0.0–0.2)

## 2018-02-09 MED ORDER — ACETAMINOPHEN 325 MG PO TABS
650.0000 mg | ORAL_TABLET | Freq: Once | ORAL | Status: AC
  Administered 2018-02-09: 650 mg via ORAL
  Filled 2018-02-09: qty 2

## 2018-02-09 MED ORDER — HYDROXYZINE HCL 25 MG PO TABS: 25.0000 mg | ORAL_TABLET | Freq: Four times a day (QID) | ORAL | Status: DC | PRN

## 2018-02-09 MED ORDER — TRAZODONE HCL 50 MG PO TABS
100.0000 mg | ORAL_TABLET | Freq: Every evening | ORAL | Status: DC | PRN
  Administered 2018-02-09: 100 mg via ORAL
  Filled 2018-02-09: qty 2

## 2018-02-09 NOTE — Progress Notes (Signed)
Patient Demographics:    Monique Harrison, is a 42 y.o. female, DOB - 1976/03/10, NFA:[id number]  Admit date - 02/05/2018   Admitting Physician Vilma Prader, MD  Outpatient Primary MD for the patient is Patient, No Pcp Per  LOS - 1   Chief Complaint  Patient presents with  . Abdominal Pain        Subjective:    Monique Harrison today has nausea, no emesis, fevers above 102, no chest pain,     Assessment  & Plan :    Principal Problem:   Gastritis with hemorrhage Active Problems:   Abdominal pain   Primary sclerosing cholangitis   Hypotension   Hemorrhagic gastritis  Brief Summary Monique Harrison is an 42 y.o. female with medical history significant ofasthma, allergies, and obesity admitted 02/05/18 with abdominal pain and found to have elevated LFTs. GGT was markedly elevated. An MRI and MRA was ordered and found to be markedly abnormal suggestive of primary biliary cirrhosis, hepatitis, chronic pyogenic cholangitis or hepatic steatosis. Evaluation by GI including EGD-- with Gastritis with hemorrhage and Single erosion.    Plan:- 1)Elevated LFTs--- ????? Primary biliary cirrhosis/Possible primary sclerosing cholangitis, hepatitis, chronic pyogenic cholangitis or hepatic steatosis. 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  GI states that defer consideration of liver biopsy to that setting.  2)Gastritis with hemorrhage and single erosion--- continue Protonix twice daily for 8 weeks, Carafate for 2 weeks, gastric biopsy results pending  3)Abd Pain--- most likely secondary to #2 above as well  as large splenic cyst found on CT, HIDA scan without acute findings  4)Obesity with Dyslipidemia with Xanthelasmas--- LDH 219, hold off on statin Rx given elevated LFTs  5)Fevers --- fevers above 102, get blood cultures as of 02/09/2018, hold off on antibiotics  6)FEN--- very poor intake, having difficulty with liquid diet due to nausea and abdominal pain, continue IV fluids  Disposition/Need for in-Hospital Stay- patient unable to be discharged at this time due to need for IVF in the setting of poor oral intake due to nausea and abdominal pain, also patient has fever above 102, blood cultures from 02/09/2018 pending  Code Status : Full    Disposition Plan  : home  Consults  :  Gi   DVT Prophylaxis  :  Lovenox    Lab Results  Component Value Date   PLT 245 02/09/2018    Inpatient Medications  Scheduled Meds: . enoxaparin (LOVENOX) injection  40 mg Subcutaneous Q24H  . loratadine  10 mg Oral Daily  . LORazepam  0.5 mg Intravenous Once  . mometasone-formoterol  2 puff Inhalation BID  . montelukast  10 mg Oral QHS  . pantoprazole  40 mg Oral BID  . sodium chloride flush  3 mL Intravenous Q12H  . sucralfate  1 g Oral TID WC & HS   Continuous Infusions: PRN Meds:.albuterol, ondansetron (ZOFRAN) IV, oxyCODONE    Anti-infectives (From admission, onward)   None        Objective:   Vitals:   02/08/18 2120 02/09/18  1157 02/09/18 0826 02/09/18 1453  BP: 104/84 103/61  117/78  Pulse: (!) 103 (!) 105  (!) 115  Resp: 18 (!) 26  20  Temp: 99.4 F (37.4 C) 100.1 F (37.8 C)  (!) 102.3 F (39.1 C)  TempSrc: Oral Oral  Oral  SpO2: 95% 94% 96% 98%  Weight:      Height:        Wt Readings from Last 3 Encounters:  02/07/18 119.2 kg  12/25/17 124.7 kg  05/13/17 122 kg     Intake/Output Summary (Last 24 hours) at 02/09/2018 1541 Last data filed at 02/09/2018 1121 Gross per 24 hour  Intake 360 ml  Output -  Net 360 ml     Physical Exam Patient is examined  daily including today on 02/09/18 , exams remain the same as of yesterday except that has changed   Gen:- Awake Alert,  In no apparent distress  HEENT:- [place].AT, No sclera icterus Neck-Supple Neck,No JVD,.  Lungs-  CTAB , good air movement CV- S1, S2 normal, regular Abd-  +ve B.Sounds, Abd Soft, epigastric and left upper quadrant area discomfort without rebound or guarding,    Extremity/Skin:- No  edema,   good pulses Psych-affect is appropriate, oriented x3 Neuro-no new focal deficits, no tremors   Data Review:   Micro Results No results found for this or any previous visit (from the past 240 hour(s)).  Radiology Reports Mr 3d Recon At Scanner  Result Date: 02/06/2018 CLINICAL DATA:  Abnormal liver function tests.  Abdominal pain. EXAM: MRI ABDOMEN WITHOUT AND WITH CONTRAST (INCLUDING MRCP) TECHNIQUE: Multiplanar multisequence MR imaging of the abdomen was performed both before and after the administration of intravenous contrast. Heavily T2-weighted images of the biliary and pancreatic ducts were obtained, and three-dimensional MRCP images were rendered by post processing. CONTRAST:  10 cc Gadavist COMPARISON:  02/05/2018 abdominal ultrasound. This demonstrated hyperechoic liver lesions. FINDINGS: Portions of exam are mild to moderately motion degraded. Lower chest: Normal heart size without pericardial or pleural effusion. Hepatobiliary: Abnormal appearance of the liver, with central areas of tubular mixed T2 signal, highly suspicious for dilated intrahepatic ducts with ductal calculi. Example images 14/8, 13/4, 10/14, and 44/12. These are most conspicuous within the left hepatic lobe. Somewhat more masslike peripheral lateral segment left liver lobe T2 hyperintensity, including on image 15/4, measures 1.7 cm and is favored to represent focal intrahepatic duct dilatation. Similarly, T2 hypointensity at 1.8 cm in the right hepatic lobe on 16/8 is favored to be related to focal intrahepatic duct  dilatation and intrahepatic ductal stones within. No gallstones or common duct dilatation. The common duct is upper normal in size for age at 7 mm. Pancreas:  Normal, without mass or ductal dilatation. Spleen: Nonenhancing anterior splenic lesion measures 8.0 x 6.1 cm on image 12/4. Peripheral T2 hypointensity with heterogeneous central T2 signal. No post-contrast enhancement. Smaller nonspecific T2 hyperintense posterior splenic lesions. No splenomegaly. Adrenals/Urinary Tract: Normal adrenal glands. Normal kidneys, without hydronephrosis. Stomach/Bowel: Grossly normal stomach and abdominal bowel loops. Vascular/Lymphatic: Normal caliber of the aorta and branch vessels. Prominent porta hepatis nodes are likely reactive, not pathologic by size criteria. Example 11 mm. Other:  No ascites. Musculoskeletal: No acute osseous abnormality. IMPRESSION: 1. Portions of exam are mild to moderately motion degraded. 2. Diffusely abnormal appearance of the liver, worse on the left. Favored to represent intrahepatic biliary duct dilatation with numerous ductal calculi. 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 Differential considerations include primary sclerosing cholangitis, HIV  cholangiopathy. Given the difficulty with visualization of intrahepatic calculi at MRI, and motion on the current exam, liver protocol CT may be informative. 3. Heterogeneous splenic lesion is favored to represent the sequelae of prior trauma (chronic hematoma) or infection. Recommend attention on follow-up. 4. Mildly prominent porta hepatis nodes, favored to be reactive. These results will be called to the ordering clinician or representative by the Radiologist Assistant, and communication documented in the PACS or zVision Dashboard. Electronically Signed   By: Abigail Miyamoto M.D.   On: 02/06/2018 14:03   Nm Hepato W/eject Fract  Result Date: 02/09/2018 CLINICAL DATA:  Acute abdominal pain, epigastric pain,  elevated LFTs, abnormal appearance of liver by MR EXAM: NUCLEAR MEDICINE HEPATOBILIARY IMAGING TECHNIQUE: Sequential images of the abdomen were obtained out to 60 minutes following intravenous administration of radiopharmaceutical. RADIOPHARMACEUTICALS:  5.4 mCi Tc-42m  Choletec IV Pharmaceutical: Morphine Sulfate 4 mg IV after 90 minutes of imaging COMPARISON:  CT abdomen and pelvis 02/06/2018 FINDINGS: Prompt clearance of tracer from bloodstream. Delayed excretion of tracer into the biliary tree. Gallbladder faintly visualized at 75 minutes period. At 90 minutes, CBD and small bowel were not visualized. Patient received morphine and imaging was continued for 30 minutes. Gallbladder better visualizes with tracer following morphine augmentation. No small bowel visualization throughout the exam. Prolonged visualization of patent parenchyma throughout the study with overall poor excretion of tracer into the biliary tree compatible with hepatocellular dysfunction. IMPRESSION: Diffuse hepatocellular dysfunction with poor biliary excretion of tracer and prolonged hepatic parenchymal retention of tracer throughout exam. Patent cystic duct. Nonvisualization of tracer within the small bowel prior to morphine augmentation, unable to establish CBD patency. Electronically Signed   By: Lavonia Dana M.D.   On: 02/09/2018 09:30   Mr Abdomen Mrcp Moise Boring Contast  Result Date: 02/06/2018 CLINICAL DATA:  Abnormal liver function tests.  Abdominal pain. EXAM: MRI ABDOMEN WITHOUT AND WITH CONTRAST (INCLUDING MRCP) TECHNIQUE: Multiplanar multisequence MR imaging of the abdomen was performed both before and after the administration of intravenous contrast. Heavily T2-weighted images of the biliary and pancreatic ducts were obtained, and three-dimensional MRCP images were rendered by post processing. CONTRAST:  10 cc Gadavist COMPARISON:  02/05/2018 abdominal ultrasound. This demonstrated hyperechoic liver lesions. FINDINGS: Portions of  exam are mild to moderately motion degraded. Lower chest: Normal heart size without pericardial or pleural effusion. Hepatobiliary: Abnormal appearance of the liver, with central areas of tubular mixed T2 signal, highly suspicious for dilated intrahepatic ducts with ductal calculi. Example images 14/8, 13/4, 10/14, and 44/12. These are most conspicuous within the left hepatic lobe. Somewhat more masslike peripheral lateral segment left liver lobe T2 hyperintensity, including on image 15/4, measures 1.7 cm and is favored to represent focal intrahepatic duct dilatation. Similarly, T2 hypointensity at 1.8 cm in the right hepatic lobe on 16/8 is favored to be related to focal intrahepatic duct dilatation and intrahepatic ductal stones within. No gallstones or common duct dilatation. The common duct is upper normal in size for age at 7 mm. Pancreas:  Normal, without mass or ductal dilatation. Spleen: Nonenhancing anterior splenic lesion measures 8.0 x 6.1 cm on image 12/4. Peripheral T2 hypointensity with heterogeneous central T2 signal. No post-contrast enhancement. Smaller nonspecific T2 hyperintense posterior splenic lesions. No splenomegaly. Adrenals/Urinary Tract: Normal adrenal glands. Normal kidneys, without hydronephrosis. Stomach/Bowel: Grossly normal stomach and abdominal bowel loops. Vascular/Lymphatic: Normal caliber of the aorta and branch vessels. Prominent porta hepatis nodes are likely reactive, not pathologic by size criteria. Example 11 mm. Other:  No ascites. Musculoskeletal: No acute osseous abnormality. IMPRESSION: 1. Portions of exam are mild to moderately motion degraded. 2. Diffusely abnormal appearance of the liver, worse on the left. Favored to represent intrahepatic biliary duct dilatation with numerous ductal calculi. 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 Differential considerations include primary sclerosing cholangitis, HIV cholangiopathy. Given  the difficulty with visualization of intrahepatic calculi at MRI, and motion on the current exam, liver protocol CT may be informative. 3. Heterogeneous splenic lesion is favored to represent the sequelae of prior trauma (chronic hematoma) or infection. Recommend attention on follow-up. 4. Mildly prominent porta hepatis nodes, favored to be reactive. These results will be called to the ordering clinician or representative by the Radiologist Assistant, and communication documented in the PACS or zVision Dashboard. Electronically Signed   By: Abigail Miyamoto M.D.   On: 02/06/2018 14:03   Ct Liver Abdomen W Wo Contrast  Result Date: 02/06/2018 CLINICAL DATA:  Abnormal liver enzymes in a cholestatic pattern EXAM: CT ABDOMEN AND PELVIS WITHOUT AND WITH CONTRAST TECHNIQUE: Multidetector CT imaging of the abdomen and pelvis was performed following the standard protocol before and following the bolus administration of intravenous contrast. CONTRAST:  174mL ISOVUE-370 IOPAMIDOL (ISOVUE-370) INJECTION 76% COMPARISON:  02/06/2018 FINDINGS: Lower chest: Atelectasis in the lingula and right middle lobe. Airway thickening. Hepatobiliary: Again noted are irregular bile ducts especially in the left hepatic lobe with some areas of cystic dilatation probably in continuity with the bile duct as on image 29/10. On MRI there were some low signal intensity filling defects in some of these areas suspicious for gallstones; these have relatively intermediate density on CT, and are not calcified or well appreciated does gallstones on CT, but the appearance was very compelling at for gallstones on the MRI. There are other large filling defects in the biliary tree on MRI that are less conspicuous on CT. I do not appreciate that the contents distending the biliary tree were enhancing on the subtraction images from the MRI, and accordingly intraductal tumor seems less likely. Gallbladder unremarkable. No appreciable portal vein thrombosis or  patent vein thrombosis. No pneumobilia. Pancreas: Unremarkable Spleen: Large rim calcified splenic cystic lesion 8.7 by 7.0 cm, without appreciable internal enhancement on CT or on the subtraction images from prior MRI. Several additional small hypodense lesions are present in the spleen posterior to this dominant cystic lesion. Adrenals/Urinary Tract: Unremarkable Stomach/Bowel: Unremarkable Vascular/Lymphatic: Multiple small porta hepatis lymph nodes are present. Reproductive: An IUD is present in the uterus. 4.6 cm fibroid in the uterine fundus. Ovaries unremarkable. Other: 2.9 by 2.8 cm focus of omental fat necrosis in the left lower quadrant on image 80/10 is likely chronic and has rim calcification. Musculoskeletal: Unremarkable IMPRESSION: 1. Irregular bile ducts with numerous small internal filling defects shown on recent MRI. These filling defects are intermediate density but demonstrate heterogeneity against the background biliary fluid density especially involving the left intrahepatic biliary tree. There are dilated portions and irregular portions favoring cholangitis potentially in conjunction with extensive intrahepatic stone formation. Potentially debris could have a similar appearance in the biliary tree. I do not see abnormal enhancement in the biliary tree to suggest biliary tumor on the recent MRI. Primary sclerosing cholangitis would be a top differential diagnostic consideration. 2. Large rim calcified splenic lesion, likely a postinflammatory or posttraumatic lesion. Smaller hypodense lesions along its posterior margin are likewise probably related to the initial insult. Electronically Signed   By: Van Clines M.D.   On: 02/06/2018 21:04  US Abdomen Limited Ruq  Result Date: 02/05/2018 CLINICAL DATA:  Right upper quadrant tenderness EXAM: ULTRASOUND ABDOMEN LIMITED RIGHT UPPER QUADRANT COMPARISON:  None. FINDINGS: Gallbladder: No gallstones, gallbladder wall thickening, or  pericholecystic fluid. Negative sonographic Murphy's sign. Common bile duct: Diameter: 6 mm Liver: Hyperechoic hepatic parenchyma, suggesting hepatic steatosis. 2.0 x 2.0 x 1.9 cm hyperechoic lesion in the central right hepatic lobe (image 38). Additional 2.0 x 2.8 x 2.4 cm vague hyperechoic lesion in the right hepatic lobe. Portal vein is patent on color Doppler imaging with normal direction of blood flow towards the liver. IMPRESSION: Suspected hepatic steatosis. Two hyperechoic hepatic lesions, likely reflecting benign hemangiomas, although incompletely characterized. Consider follow-up MRI abdomen with/without contrast in 3 months. Electronically Signed   By: Julian Hy M.D.   On: 02/05/2018 17:40     CBC Recent Labs  Lab 02/05/18 1401 02/06/18 0649 02/07/18 0742 02/07/18 1616 02/08/18 0000 02/08/18 0711 02/09/18 0346  WBC 8.3 8.7 9.8  --   --   --  11.8*  HGB 13.3 13.5 12.8 12.7 12.5 12.5 12.4  HCT 41.1 41.7 39.2 40.3 38.8 39.5 39.3  PLT 291 293 262  --   --   --  245  MCV 93.0 93.1 92.2  --   --   --  91.2  MCH 30.1 30.1 30.1  --   --   --  28.8  MCHC 32.4 32.4 32.7  --   --   --  31.6  RDW 13.2 13.4 13.2  --   --   --  13.2  LYMPHSABS  --  2.3 1.6  --   --   --   --   MONOABS  --  0.5 0.6  --   --   --   --   EOSABS  --  0.8* 0.5  --   --   --   --   BASOSABS  --  0.1 0.1  --   --   --   --     Chemistries  Recent Labs  Lab 02/05/18 1401 02/06/18 0649 02/07/18 0742 02/09/18 0346  NA 138 135 137 135  K 4.0 4.4 4.1 3.8  CL 103 106 104 103  CO2 27 24 25 24   GLUCOSE 111* 84 90 107*  BUN 7 5* 6 5*  CREATININE 0.75 0.73 0.76 0.82  CALCIUM 9.1 8.4* 9.1 8.6*  AST 287* 244* 124* 83*  ALT 227* 243* 172* 122*  ALKPHOS 684* 727* 661* 518*  BILITOT 1.9* 1.6* 1.3* 3.2*   ------------------------------------------------------------------------------------------------------------------ No results for input(s): CHOL, HDL, LDLCALC, TRIG, CHOLHDL, LDLDIRECT in the last 72  hours.  Lab Results  Component Value Date   HGBA1C 4.9 02/05/2018   ------------------------------------------------------------------------------------------------------------------ No results for input(s): TSH, T4TOTAL, T3FREE, THYROIDAB in the last 72 hours.  Invalid input(s): FREET3 ------------------------------------------------------------------------------------------------------------------ No results for input(s): VITAMINB12, FOLATE, FERRITIN, TIBC, IRON, RETICCTPCT in the last 72 hours.  Coagulation profile Recent Labs  Lab 02/05/18 1820  INR 0.96    No results for input(s): DDIMER in the last 72 hours.  Cardiac Enzymes No results for input(s): CKMB, TROPONINI, MYOGLOBIN in the last 168 hours.  Invalid input(s): CK ------------------------------------------------------------------------------------------------------------------ No results found for: BNP   Roxan Hockey M.D on 02/09/2018 at 3:41 PM  Pager---[phone] Go to [email or web address] - [email or web address] for contact info  [facility] - Office  [phone]

## 2018-02-09 NOTE — Plan of Care (Signed)
  Problem: Clinical Measurements: Goal: Ability to maintain clinical measurements within normal limits will improve Outcome: Progressing   Problem: Coping: Goal: Level of anxiety will decrease Outcome: Progressing   Problem: Pain Managment: Goal: General experience of comfort will improve Outcome: Progressing   

## 2018-02-10 ENCOUNTER — Inpatient Hospital Stay (HOSPITAL_COMMUNITY): Payer: 59

## 2018-02-10 DIAGNOSIS — J189 Pneumonia, unspecified organism: Secondary | ICD-10-CM | POA: Clinically undetermined

## 2018-02-10 DIAGNOSIS — B9620 Unspecified Escherichia coli [E. coli] as the cause of diseases classified elsewhere: Secondary | ICD-10-CM | POA: Diagnosis present

## 2018-02-10 DIAGNOSIS — R7881 Bacteremia: Secondary | ICD-10-CM

## 2018-02-10 DIAGNOSIS — D734 Cyst of spleen: Secondary | ICD-10-CM | POA: Diagnosis present

## 2018-02-10 DIAGNOSIS — R945 Abnormal results of liver function studies: Secondary | ICD-10-CM

## 2018-02-10 DIAGNOSIS — R10811 Right upper quadrant abdominal tenderness: Secondary | ICD-10-CM

## 2018-02-10 DIAGNOSIS — R7989 Other specified abnormal findings of blood chemistry: Secondary | ICD-10-CM

## 2018-02-10 LAB — BLOOD CULTURE ID PANEL (REFLEXED)
ACINETOBACTER BAUMANNII: NOT DETECTED
CANDIDA KRUSEI: NOT DETECTED
CARBAPENEM RESISTANCE: NOT DETECTED
Candida albicans: NOT DETECTED
Candida glabrata: NOT DETECTED
Candida parapsilosis: NOT DETECTED
Candida tropicalis: NOT DETECTED
ESCHERICHIA COLI: DETECTED — AB
Enterobacter cloacae complex: NOT DETECTED
Enterobacteriaceae species: DETECTED — AB
Enterococcus species: NOT DETECTED
HAEMOPHILUS INFLUENZAE: NOT DETECTED
Klebsiella oxytoca: NOT DETECTED
Klebsiella pneumoniae: NOT DETECTED
Listeria monocytogenes: NOT DETECTED
Methicillin resistance: NOT DETECTED
NEISSERIA MENINGITIDIS: NOT DETECTED
Proteus species: NOT DETECTED
Pseudomonas aeruginosa: NOT DETECTED
SERRATIA MARCESCENS: NOT DETECTED
STAPHYLOCOCCUS AUREUS BCID: NOT DETECTED
STREPTOCOCCUS AGALACTIAE: NOT DETECTED
STREPTOCOCCUS PNEUMONIAE: NOT DETECTED
STREPTOCOCCUS PYOGENES: NOT DETECTED
STREPTOCOCCUS SPECIES: NOT DETECTED
Staphylococcus species: NOT DETECTED
Vancomycin resistance: NOT DETECTED

## 2018-02-10 LAB — URINALYSIS, ROUTINE W REFLEX MICROSCOPIC
Bilirubin Urine: NEGATIVE
Glucose, UA: NEGATIVE mg/dL
KETONES UR: NEGATIVE mg/dL
Nitrite: NEGATIVE
PH: 6 (ref 5.0–8.0)
PROTEIN: NEGATIVE mg/dL
Specific Gravity, Urine: 1.004 — ABNORMAL LOW (ref 1.005–1.030)

## 2018-02-10 MED ORDER — ACETAMINOPHEN 325 MG PO TABS
650.0000 mg | ORAL_TABLET | Freq: Once | ORAL | Status: AC
  Administered 2018-02-10: 650 mg via ORAL
  Filled 2018-02-10: qty 2

## 2018-02-10 MED ORDER — SODIUM CHLORIDE 0.9 % IV SOLN
2.0000 g | INTRAVENOUS | Status: DC
  Administered 2018-02-10 – 2018-02-12 (×3): 2 g via INTRAVENOUS
  Filled 2018-02-10 (×4): qty 20

## 2018-02-10 MED ORDER — SUCRALFATE 1 GM/10ML PO SUSP: 1.0000 g | Freq: Three times a day (TID) | ORAL | Status: DC

## 2018-02-10 MED ORDER — AZITHROMYCIN 500 MG PO TABS
500.0000 mg | ORAL_TABLET | Freq: Every day | ORAL | Status: DC
  Administered 2018-02-10 – 2018-02-12 (×3): 500 mg via ORAL
  Filled 2018-02-10 (×3): qty 1

## 2018-02-10 MED ORDER — PIPERACILLIN-TAZOBACTAM 3.375 G IVPB: 3.3750 g | Freq: Three times a day (TID) | INTRAVENOUS | Status: DC

## 2018-02-10 MED ORDER — SODIUM CHLORIDE 0.9 % IV SOLN
500.0000 mg | INTRAVENOUS | Status: DC
  Filled 2018-02-10: qty 500

## 2018-02-10 NOTE — Progress Notes (Signed)
Daily Rounding Note  02/10/2018, 12:58 PM  LOS: 2 days   SUBJECTIVE:   Chief complaint: Postprandial epigastric pain.  This has resolved. She remains anorexic but tolerates what little food she is eating.  Had a bowel movement this morning.  Still feels under the weather.  OBJECTIVE:         Vital signs in last 24 hours:    Temp:  [99.8 F (37.7 C)-103.1 F (39.5 C)] 100.8 F (38.2 C) (10/11 1058) Pulse Rate:  [91-115] 108 (10/11 0538) Resp:  [20] 20 (10/10 1453) BP: (89-117)/(63-78) 89/63 (10/11 0538) SpO2:  [93 %-98 %] 94 % (10/11 0801) Last BM Date: 02/08/18 [facility] Weights   02/05/18 2112 02/07/18 1103 02/07/18 1827  Weight: 120.2 kg 120 kg 119.2 kg   General: Pleasant, comfortable, does not look ill just tired. Heart: RRR. Chest: Clear bilaterally.  No labored breathing or cough. Abdomen: Not tender or distended.  Active bowel sounds. Extremities: no CCE Neuro/Psych: Fully alert and oriented.  Appropriate.  Fluid speech.  No tremors.  No deficits.  Intake/Output from previous day: 10/10 0701 - 10/11 0700 In: 603 [P.O.:600; I.V.:3] Out: -   Intake/Output this shift: Total I/O In: 120 [P.O.:120] Out: 1 [Stool:1]  Lab Results: Recent Labs    02/08/18 0000 02/08/18 0711 02/09/18 0346  WBC  --   --  11.8*  HGB 12.5 12.5 12.4  HCT 38.8 39.5 39.3  PLT  --   --  245   BMET Recent Labs    02/09/18 0346  NA 135  K 3.8  CL 103  CO2 24  GLUCOSE 107*  BUN 5*  CREATININE 0.82  CALCIUM 8.6*   LFT Recent Labs    02/09/18 0346  PROT 8.4*  ALBUMIN 2.8*  AST 83*  ALT 122*  ALKPHOS 518*  BILITOT 3.2*  BILIDIR 1.7*   PT/INR No results for input(s): LABPROT, INR in the last 72 hours. Hepatitis Panel No results for input(s): HEPBSAG, HCVAB, HEPAIGM, HEPBIGM in the last 72 hours.  Studies/Results: Dg Chest 2 View  Result Date: 02/10/2018 CLINICAL DATA:  Fever for 2 days. EXAM: CHEST - 2  VIEW COMPARISON:  None. FINDINGS: The heart size and mediastinal contours are within normal limits. Mild patchy opacity of right lung base is identified. There is no pulmonary edema or pleural effusion. The visualized skeletal structures are unremarkable. IMPRESSION: Mild patchy opacity of right lung base, developing pneumonia is not excluded. Electronically Signed   By: Abelardo Diesel M.D.   On: 02/10/2018 11:53   Nm Hepato W/eject Fract  Result Date: 02/09/2018 CLINICAL DATA:  Acute abdominal pain, epigastric pain, elevated LFTs, abnormal appearance of liver by MR EXAM: NUCLEAR MEDICINE HEPATOBILIARY IMAGING TECHNIQUE: Sequential images of the abdomen were obtained out to 60 minutes following intravenous administration of radiopharmaceutical. RADIOPHARMACEUTICALS:  5.4 mCi Tc-64m  Choletec IV Pharmaceutical: Morphine Sulfate 4 mg IV after 90 minutes of imaging COMPARISON:  CT abdomen and pelvis 02/06/2018 FINDINGS: Prompt clearance of tracer from bloodstream. Delayed excretion of tracer into the biliary tree. Gallbladder faintly visualized at 75 minutes period. At 90 minutes, CBD and small bowel were not visualized. Patient received morphine and imaging was continued for 30 minutes. Gallbladder better visualizes with tracer following morphine augmentation. No small bowel visualization throughout the exam. Prolonged visualization of patent parenchyma throughout the study with overall poor excretion of tracer into the biliary tree compatible with hepatocellular dysfunction. IMPRESSION: Diffuse hepatocellular dysfunction with  poor biliary excretion of tracer and prolonged hepatic parenchymal retention of tracer throughout exam. Patent cystic duct. Nonvisualization of tracer within the small bowel prior to morphine augmentation, unable to establish CBD patency. Electronically Signed   By: Lavonia Dana M.D.   On: 02/09/2018 09:30   Scheduled Meds: . enoxaparin (LOVENOX) injection  40 mg Subcutaneous Q24H  .  loratadine  10 mg Oral Daily  . LORazepam  0.5 mg Intravenous Once  . mometasone-formoterol  2 puff Inhalation BID  . montelukast  10 mg Oral QHS  . pantoprazole  40 mg Oral BID  . sodium chloride flush  3 mL Intravenous Q12H  . sucralfate  1 g Oral TID WC & HS   Continuous Infusions: . azithromycin    . cefTRIAXone (ROCEPHIN)  IV     PRN Meds:.albuterol, hydrOXYzine, ondansetron (ZOFRAN) IV, oxyCODONE, traZODone  ASSESMENT:   *   Post prandial epigastric pain.  improved.  Tolerating limited p.o.'s but she is not hungry at all so she is not consuming much. 10/8 EGD: gastritis, gastric erosion.   Pathology:   Chronic gastritis with goblet cell metaplasia.  No H. Pylori Protonix BID, Carafate in place.   *  Abnormal LFTs, chronic. Improved over last 3 days.   Abnormal appearance bile ducts per CT. 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 IgG 1849 elevated IgM 249, elevated IgA 294, normal AMA < 20, normal.   C-ANCA titer <1:20 P-ANCA titer <1:20 Acute hepatitis panel normal.   ANA negative  Anti-parietal cell Ab pending Gastrin level pending.   Intrinsic factor Ab pending.    HIDA scan: patent cystic duct.  Hepatocellular dysfx, prolonged retention of tracer in liver.  Non vis of tracer in SB, so not able to establish patency of CBD.    *    UTI.  GNR's growing from blood culture On PCR ID panel Enterobacter and E. coli detected. On Rocephin.     PLAN   *  outpt referral to hepatology at [facility].  *   Hopefully with treatment of her UTI, her appetite may improve but with ongoing liver disease, she may have persistent anorexia.    Azucena Freed  02/10/2018, 12:58 PM Phone [phone]

## 2018-02-10 NOTE — Progress Notes (Signed)
Tech reported to RN that pt has a temp of 100.9. No tylenol order. Paged MD with tylenol request. Awaiting response.

## 2018-02-10 NOTE — Progress Notes (Signed)
Patient temperature 101.25F, Paged [place], NP awaiting callback or orders to be placed. Will continue to monitor. Notify oncall NP of any changes.

## 2018-02-10 NOTE — Progress Notes (Addendum)
[facility] ROUNDING NOTE  CC:  I'm hurting mostly in my left side Subjective: Continues to do well on liquids. Concerned about solids. Has epigastric pain that persists and now with some pain in the LUQ. No pleuritic component. Remains anxious about her diagnosis. GI ROS is otherwise negative. No family was present at the time of my evaluation.    Objective: Vital signs in last 24 hours: Temp:  [99.8 F (37.7 C)-103.1 F (39.5 C)] 100.8 F (38.2 C) (10/11 0538) Pulse Rate:  [91-115] 108 (10/11 0538) Resp:  [20] 20 (10/10 1453) BP: (89-117)/(63-78) 89/63 (10/11 0538) SpO2:  [93 %-98 %] 96 % (10/11 0538) Last BM Date: 02/08/18(Pt stated that her last BM was yesterday,10/9.) General: NAD. Slightly anxious. Lungs: Clear anteriorly. No wheezing. Heart: Normal rate and rhythm Abdomen: Central obesity. Normal  bowel sounds.  Tenderness with palpation in the epigastrium and left upper quadrant spleen tip is palpable.  No rebound or guarding.     Intake/Output from previous day: 10/10 0701 - 10/11 0700 In: 603 [P.O.:600; I.V.:3] Out: -  Intake/Output this shift: Total I/O In: 243 [P.O.:240; I.V.:3] Out: -    Lab Results: Recent Labs    02/07/18 0742  02/08/18 0000 02/08/18 0711 02/09/18 0346  WBC 9.8  --   --   --  11.8*  HGB 12.8   < > 12.5 12.5 12.4  PLT 262  --   --   --  245  MCV 92.2  --   --   --  91.2   < > = values in this interval not displayed.   BMET Recent Labs    02/07/18 0742 02/09/18 0346  NA 137 135  K 4.1 3.8  CL 104 103  CO2 25 24  GLUCOSE 90 107*  BUN 6 5*  CREATININE 0.76 0.82  CALCIUM 9.1 8.6*   LFT Recent Labs    02/07/18 0742 02/09/18 0346  PROT 7.6 8.4*  ALBUMIN 2.9* 2.8*  AST 124* 83*  ALT 172* 122*  ALKPHOS 661* 518*  BILITOT 1.3* 3.2*  BILIDIR  --  1.7*   PT/INR No results for input(s): INR in the last 72 hours.    Imaging/Other results: Nm Hepato W/eject Fract  Result Date: 02/09/2018 CLINICAL DATA:   Acute abdominal pain, epigastric pain, elevated LFTs, abnormal appearance of liver by MR EXAM: NUCLEAR MEDICINE HEPATOBILIARY IMAGING TECHNIQUE: Sequential images of the abdomen were obtained out to 60 minutes following intravenous administration of radiopharmaceutical. RADIOPHARMACEUTICALS:  5.4 mCi Tc-9m  Choletec IV Pharmaceutical: Morphine Sulfate 4 mg IV after 90 minutes of imaging COMPARISON:  CT abdomen and pelvis 02/06/2018 FINDINGS: Prompt clearance of tracer from bloodstream. Delayed excretion of tracer into the biliary tree. Gallbladder faintly visualized at 75 minutes period. At 90 minutes, CBD and small bowel were not visualized. Patient received morphine and imaging was continued for 30 minutes. Gallbladder better visualizes with tracer following morphine augmentation. No small bowel visualization throughout the exam. Prolonged visualization of patent parenchyma throughout the study with overall poor excretion of tracer into the biliary tree compatible with hepatocellular dysfunction. IMPRESSION: Diffuse hepatocellular dysfunction with poor biliary excretion of tracer and prolonged hepatic parenchymal retention of tracer throughout exam. Patent cystic duct. Nonvisualization of tracer within the small bowel prior to morphine augmentation, unable to establish CBD patency. Electronically Signed   By: Lavonia Dana M.D.   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 I wonder if this is related to the large splenic cyst seen on recent CT scan. If this pain persists, could consider percutaneous drainage or decapsulation/cyst wall unroofing.   I reviewed the gastric biopsy results with the patient today. Continue Carafate x 2 weeks, PPI BID x 8 weeks. Advance diet as tolerated.   Outpatient referral to [facility] (patient request). Awaiting the remainder of her autoimmune antibodies. Would defer consideration of liver biopsy to that setting. No obvious treatments available at this time.    Thornton Park, MD, MPH [facility]

## 2018-02-10 NOTE — Progress Notes (Addendum)
PHARMACY - PHYSICIAN COMMUNICATION CRITICAL VALUE ALERT - BLOOD CULTURE IDENTIFICATION (BCID)  Monique Harrison is an 42 y.o. female who presented to [facility] on 02/05/2018 with a chief complaint of abdominal pain  Assessment: Blood cultures 1/4 with E. Coli. Also treating for possible right-sided pneumonia  Name of physician (or Provider) Contacted: Dr. Denton Brick  Current antibiotics: ceftriaxone and azithromycin  Changes to prescribed antibiotics recommended: continue ceftriaxone for blood culture  Results for orders placed or performed during the hospital encounter of 02/05/18  Blood Culture ID Panel (Reflexed) (Collected: 02/09/2018  3:45 PM)  Result Value Ref Range   Enterococcus species NOT DETECTED NOT DETECTED   Vancomycin resistance NOT DETECTED NOT DETECTED   Listeria monocytogenes NOT DETECTED NOT DETECTED   Staphylococcus species NOT DETECTED NOT DETECTED   Staphylococcus aureus (BCID) NOT DETECTED NOT DETECTED   Methicillin resistance NOT DETECTED NOT DETECTED   Streptococcus species NOT DETECTED NOT DETECTED   Streptococcus agalactiae NOT DETECTED NOT DETECTED   Streptococcus pneumoniae NOT DETECTED NOT DETECTED   Streptococcus pyogenes NOT DETECTED NOT DETECTED   Acinetobacter baumannii NOT DETECTED NOT DETECTED   Enterobacteriaceae species DETECTED (A) NOT DETECTED   Enterobacter cloacae complex NOT DETECTED NOT DETECTED   Escherichia coli DETECTED (A) NOT DETECTED   Klebsiella oxytoca NOT DETECTED NOT DETECTED   Klebsiella pneumoniae NOT DETECTED NOT DETECTED   Proteus species NOT DETECTED NOT DETECTED   Serratia marcescens NOT DETECTED NOT DETECTED   Carbapenem resistance NOT DETECTED NOT DETECTED   Haemophilus influenzae NOT DETECTED NOT DETECTED   Neisseria meningitidis NOT DETECTED NOT DETECTED   Pseudomonas aeruginosa NOT DETECTED NOT DETECTED   Candida albicans NOT DETECTED NOT DETECTED   Candida glabrata NOT DETECTED NOT DETECTED   Candida krusei NOT  DETECTED NOT DETECTED   Candida parapsilosis NOT DETECTED NOT DETECTED   Candida tropicalis NOT DETECTED NOT DETECTED   Vertis Kelch, PharmD PGY1 Pharmacy Resident Phone [phone] 02/10/2018       10:00 AM

## 2018-02-10 NOTE — Progress Notes (Signed)
Patient Demographics:    Monique Harrison, is a 42 y.o. female, DOB - 1975-08-09, LKH:[id number]  Admit date - 02/05/2018   Admitting Physician Vilma Prader, MD  Outpatient Primary MD for the patient is Patient, No Pcp Per  LOS - 2   Chief Complaint  Patient presents with  . Abdominal Pain        Subjective:    [name] today has nausea, no emesis, fevers above 102, no chest pain,     Assessment  & Plan :    Principal Problem:   E coli bacteremia Active Problems:   Abdominal pain   Primary sclerosing cholangitis   Hypotension   Gastritis with hemorrhage   Hemorrhagic gastritis   Cyst of spleen   Rt Sided CAP (community acquired pneumonia)  Brief Summary Monique Harrison is an 42 y.o. female with medical history significant ofasthma, allergies, and obesity admitted 02/05/18 with abdominal pain and found to have elevated LFTs. GGT was markedly elevated. An MRI and MRA was ordered and found to be markedly abnormal suggestive of primary biliary cirrhosis, hepatitis, chronic pyogenic cholangitis or hepatic steatosis. Evaluation by GI including EGD-- with Gastritis with hemorrhage and Single erosion.    Plan:- 1) E. coli bacteremia secondary to presumed Cholangitis--- blood cultures from 02/09/18 positive for E. Coli, patient with fevers, chills, rigors and abdominal pain with nausea, treat empirically with IV Rocephin 2 g every 24 hours (started 02/10/18) pending final ID and 6 DVT of the blood cultures, T-max 103.1 T-current 100.8  2) possible right-sided pneumonia----treat empirically with IV Rocephin and azithromycin, clinically low index of suspicion for pneumonia, Blood cultures growing E. coli which is not a typical pulmonary pathogen  3)Elevated LFTs--- ????? Primary biliary cirrhosis/Possible primary sclerosing cholangitis, hepatitis, chronic pyogenic cholangitis or hepatic  steatosis. 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  GI states that defer consideration of liver biopsy to that setting.  4)Gastritis with Hemorrhage and single erosion--- continue Protonix twice daily for 8 weeks, Carafate for 2 weeks, gastric biopsy without H. pylori, no dysplasia or malignancy  5)Abd Pain--- most likely secondary to cholangitis,  well as large splenic cyst found on CT, gastritis may be contributing to patient's epigastric discomfort,  HIDA scan without acute findings  6)Obesity with Dyslipidemia with Xanthelasmas--- LDH 219, hold off on statin Rx given elevated LFTs  7)FEN--- very poor intake, having difficulty with liquid diet due to nausea and abdominal pain, continue IV fluids  Disposition/Need for in-Hospital Stay- patient unable to be discharged at this time due to persistent fevers in the setting of E. coli bacteremia requiring IV antibiotics pending final ID and sensitivity of blood cultures    Code Status : Full   Disposition Plan  : home  Consults  :  Gi  DVT Prophylaxis  :  Lovenox    Lab Results  Component Value Date   PLT 245 02/09/2018    Inpatient Medications  Scheduled Meds: . acetaminophen  650 mg Oral Once  . enoxaparin (LOVENOX) injection  40 mg  Subcutaneous Q24H  . loratadine  10 mg Oral Daily  . LORazepam  0.5 mg Intravenous Once  . mometasone-formoterol  2 puff Inhalation BID  . montelukast  10 mg Oral QHS  . pantoprazole  40 mg Oral BID  . sodium chloride flush  3 mL Intravenous Q12H  . sucralfate  1 g Oral TID WC & HS   Continuous Infusions: . azithromycin    . cefTRIAXone (ROCEPHIN)  IV     PRN Meds:.albuterol, hydrOXYzine,  ondansetron (ZOFRAN) IV, oxyCODONE, traZODone    Anti-infectives (From admission, onward)   Start     Dose/Rate Route Frequency Ordered Stop   02/10/18 1245  cefTRIAXone (ROCEPHIN) 2 g in sodium chloride 0.9 % 100 mL IVPB     2 g 200 mL/hr over 30 Minutes Intravenous Every 24 hours 02/10/18 1239 02/17/18 1244   02/10/18 1245  azithromycin (ZITHROMAX) 500 mg in sodium chloride 0.9 % 250 mL IVPB     500 mg 250 mL/hr over 60 Minutes Intravenous Every 24 hours 02/10/18 1239     02/10/18 0930  piperacillin-tazobactam (ZOSYN) IVPB 3.375 g  Status:  Discontinued     3.375 g 12.5 mL/hr over 240 Minutes Intravenous Every 8 hours 02/10/18 0919 02/10/18 1239        Objective:   Vitals:   02/09/18 2141 02/10/18 0538 02/10/18 0801 02/10/18 1058  BP: 101/69 (!) 89/63    Pulse: 91 (!) 108    Resp:      Temp: 99.8 F (37.7 C) (!) 100.8 F (38.2 C)  (!) 100.8 F (38.2 C)  TempSrc:      SpO2: 93% 96% 94%   Weight:      Height:        Wt Readings from Last 3 Encounters:  02/07/18 119.2 kg  12/25/17 124.7 kg  05/13/17 122 kg     Intake/Output Summary (Last 24 hours) at 02/10/2018 1243 Last data filed at 02/10/2018 1116 Gross per 24 hour  Intake 363 ml  Output 1 ml  Net 362 ml    Physical Exam Patient is examined daily including today on 02/10/18 , exams remain the same as of yesterday except that has changed   Gen:- Awake Alert,  In no apparent distress  HEENT:- [place].AT, No sclera icterus Neck-Supple Neck,No JVD,.  Lungs-mostly clear, and movement is fair and symmetrical  CV- S1, S2 normal, regular Abd-  +ve B.Sounds, Abd Soft, epigastric and left upper quadrant area discomfort without rebound or guarding,    Extremity/Skin:- No  edema,   good pulses Psych-affect is appropriate, oriented x3 Neuro-no new focal deficits, no tremors   Data Review:   Micro Results Recent Results (from the past 240 hour(s))  Culture, blood (Routine X 2) w Reflex to ID Panel     Status: None  (Preliminary result)   Collection Time: 02/09/18  3:45 PM  Result Value Ref Range Status   Specimen Description BLOOD LEFT ANTECUBITAL  Final   Special Requests   Final    BOTTLES DRAWN AEROBIC ONLY Blood Culture adequate volume   Culture  Setup Time   Final    GRAM NEGATIVE RODS AEROBIC BOTTLE ONLY CRITICAL RESULT CALLED TO, READ BACK BY AND VERIFIED WITH: Cecille Rubin, AT 6659 02/10/18 BY D. VANHOOK Organism ID to follow Performed at [facility], [place] [address] [place], [place] [address]    Culture GRAM NEGATIVE RODS  Final   Report Status PENDING  Incomplete  Blood Culture ID Panel (  Reflexed)     Status: Abnormal   Collection Time: 02/09/18  3:45 PM  Result Value Ref Range Status   Enterococcus species NOT DETECTED NOT DETECTED Final   Vancomycin resistance NOT DETECTED NOT DETECTED Final   Listeria monocytogenes NOT DETECTED NOT DETECTED Final   Staphylococcus species NOT DETECTED NOT DETECTED Final   Staphylococcus aureus (BCID) NOT DETECTED NOT DETECTED Final   Methicillin resistance NOT DETECTED NOT DETECTED Final   Streptococcus species NOT DETECTED NOT DETECTED Final   Streptococcus agalactiae NOT DETECTED NOT DETECTED Final   Streptococcus pneumoniae NOT DETECTED NOT DETECTED Final   Streptococcus pyogenes NOT DETECTED NOT DETECTED Final   Acinetobacter baumannii NOT DETECTED NOT DETECTED Final   Enterobacteriaceae species DETECTED (A) NOT DETECTED Final    Comment: CRITICAL RESULT CALLED TO, READ BACK BY AND VERIFIED WITH: Dawson Bills PharmD 11:55 02/10/18 ([email or web address]) Enterobacteriaceae represent a large family of gram-negative bacteria, not a single organism.    Enterobacter cloacae complex NOT DETECTED NOT DETECTED Final   Escherichia coli DETECTED (A) NOT DETECTED Final    Comment: CRITICAL RESULT CALLED TO, READ BACK BY AND VERIFIED WITH: Dawson Bills PharmD 11:55 02/10/18 ([email or web address])    Klebsiella oxytoca NOT DETECTED NOT DETECTED Final   Klebsiella  pneumoniae NOT DETECTED NOT DETECTED Final   Proteus species NOT DETECTED NOT DETECTED Final   Serratia marcescens NOT DETECTED NOT DETECTED Final   Carbapenem resistance NOT DETECTED NOT DETECTED Final   Haemophilus influenzae NOT DETECTED NOT DETECTED Final   Neisseria meningitidis NOT DETECTED NOT DETECTED Final   Pseudomonas aeruginosa NOT DETECTED NOT DETECTED Final   Candida albicans NOT DETECTED NOT DETECTED Final   Candida glabrata NOT DETECTED NOT DETECTED Final   Candida krusei NOT DETECTED NOT DETECTED Final   Candida parapsilosis NOT DETECTED NOT DETECTED Final   Candida tropicalis NOT DETECTED NOT DETECTED Final    Comment: Performed at [facility], [place] [address], [place], [place] [address]    Radiology Reports Dg Chest 2 View  Result Date: 02/10/2018 CLINICAL DATA:  Fever for 2 days. EXAM: CHEST - 2 VIEW COMPARISON:  None. FINDINGS: The heart size and mediastinal contours are within normal limits. Mild patchy opacity of right lung base is identified. There is no pulmonary edema or pleural effusion. The visualized skeletal structures are unremarkable. IMPRESSION: Mild patchy opacity of right lung base, developing pneumonia is not excluded. Electronically Signed   By: Abelardo Diesel M.D.   On: 02/10/2018 11:53   Mr 3d Recon At Scanner  Result Date: 02/06/2018 CLINICAL DATA:  Abnormal liver function tests.  Abdominal pain. EXAM: MRI ABDOMEN WITHOUT AND WITH CONTRAST (INCLUDING MRCP) TECHNIQUE: Multiplanar multisequence MR imaging of the abdomen was performed both before and after the administration of intravenous contrast. Heavily T2-weighted images of the biliary and pancreatic ducts were obtained, and three-dimensional MRCP images were rendered by post processing. CONTRAST:  10 cc Gadavist COMPARISON:  02/05/2018 abdominal ultrasound. This demonstrated hyperechoic liver lesions. FINDINGS: Portions of exam are mild to moderately motion degraded. Lower chest: Normal heart  size without pericardial or pleural effusion. Hepatobiliary: Abnormal appearance of the liver, with central areas of tubular mixed T2 signal, highly suspicious for dilated intrahepatic ducts with ductal calculi. Example images 14/8, 13/4, 10/14, and 44/12. These are most conspicuous within the left hepatic lobe. Somewhat more masslike peripheral lateral segment left liver lobe T2 hyperintensity, including on image 15/4, measures 1.7 cm and is favored to represent focal intrahepatic duct dilatation. Similarly, T2  hypointensity at 1.8 cm in the right hepatic lobe on 16/8 is favored to be related to focal intrahepatic duct dilatation and intrahepatic ductal stones within. No gallstones or common duct dilatation. The common duct is upper normal in size for age at 7 mm. Pancreas:  Normal, without mass or ductal dilatation. Spleen: Nonenhancing anterior splenic lesion measures 8.0 x 6.1 cm on image 12/4. Peripheral T2 hypointensity with heterogeneous central T2 signal. No post-contrast enhancement. Smaller nonspecific T2 hyperintense posterior splenic lesions. No splenomegaly. Adrenals/Urinary Tract: Normal adrenal glands. Normal kidneys, without hydronephrosis. Stomach/Bowel: Grossly normal stomach and abdominal bowel loops. Vascular/Lymphatic: Normal caliber of the aorta and branch vessels. Prominent porta hepatis nodes are likely reactive, not pathologic by size criteria. Example 11 mm. Other:  No ascites. Musculoskeletal: No acute osseous abnormality. IMPRESSION: 1. Portions of exam are mild to moderately motion degraded. 2. Diffusely abnormal appearance of the liver, worse on the left. Favored to represent intrahepatic biliary duct dilatation with numerous ductal calculi. 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 Differential considerations include primary sclerosing cholangitis, HIV cholangiopathy. Given the difficulty with visualization of intrahepatic calculi at MRI, and  motion on the current exam, liver protocol CT may be informative. 3. Heterogeneous splenic lesion is favored to represent the sequelae of prior trauma (chronic hematoma) or infection. Recommend attention on follow-up. 4. Mildly prominent porta hepatis nodes, favored to be reactive. These results will be called to the ordering clinician or representative by the Radiologist Assistant, and communication documented in the PACS or zVision Dashboard. Electronically Signed   By: Abigail Miyamoto M.D.   On: 02/06/2018 14:03   Nm Hepato W/eject Fract  Result Date: 02/09/2018 CLINICAL DATA:  Acute abdominal pain, epigastric pain, elevated LFTs, abnormal appearance of liver by MR EXAM: NUCLEAR MEDICINE HEPATOBILIARY IMAGING TECHNIQUE: Sequential images of the abdomen were obtained out to 60 minutes following intravenous administration of radiopharmaceutical. RADIOPHARMACEUTICALS:  5.4 mCi Tc-69m  Choletec IV Pharmaceutical: Morphine Sulfate 4 mg IV after 90 minutes of imaging COMPARISON:  CT abdomen and pelvis 02/06/2018 FINDINGS: Prompt clearance of tracer from bloodstream. Delayed excretion of tracer into the biliary tree. Gallbladder faintly visualized at 75 minutes period. At 90 minutes, CBD and small bowel were not visualized. Patient received morphine and imaging was continued for 30 minutes. Gallbladder better visualizes with tracer following morphine augmentation. No small bowel visualization throughout the exam. Prolonged visualization of patent parenchyma throughout the study with overall poor excretion of tracer into the biliary tree compatible with hepatocellular dysfunction. IMPRESSION: Diffuse hepatocellular dysfunction with poor biliary excretion of tracer and prolonged hepatic parenchymal retention of tracer throughout exam. Patent cystic duct. Nonvisualization of tracer within the small bowel prior to morphine augmentation, unable to establish CBD patency. Electronically Signed   By: Lavonia Dana M.D.   On:  02/09/2018 09:30   Mr Abdomen Mrcp Moise Boring Contast  Result Date: 02/06/2018 CLINICAL DATA:  Abnormal liver function tests.  Abdominal pain. EXAM: MRI ABDOMEN WITHOUT AND WITH CONTRAST (INCLUDING MRCP) TECHNIQUE: Multiplanar multisequence MR imaging of the abdomen was performed both before and after the administration of intravenous contrast. Heavily T2-weighted images of the biliary and pancreatic ducts were obtained, and three-dimensional MRCP images were rendered by post processing. CONTRAST:  10 cc Gadavist COMPARISON:  02/05/2018 abdominal ultrasound. This demonstrated hyperechoic liver lesions. FINDINGS: Portions of exam are mild to moderately motion degraded. Lower chest: Normal heart size without pericardial or pleural effusion. Hepatobiliary: Abnormal appearance of the liver, with central areas of tubular  mixed T2 signal, highly suspicious for dilated intrahepatic ducts with ductal calculi. Example images 14/8, 13/4, 10/14, and 44/12. These are most conspicuous within the left hepatic lobe. Somewhat more masslike peripheral lateral segment left liver lobe T2 hyperintensity, including on image 15/4, measures 1.7 cm and is favored to represent focal intrahepatic duct dilatation. Similarly, T2 hypointensity at 1.8 cm in the right hepatic lobe on 16/8 is favored to be related to focal intrahepatic duct dilatation and intrahepatic ductal stones within. No gallstones or common duct dilatation. The common duct is upper normal in size for age at 7 mm. Pancreas:  Normal, without mass or ductal dilatation. Spleen: Nonenhancing anterior splenic lesion measures 8.0 x 6.1 cm on image 12/4. Peripheral T2 hypointensity with heterogeneous central T2 signal. No post-contrast enhancement. Smaller nonspecific T2 hyperintense posterior splenic lesions. No splenomegaly. Adrenals/Urinary Tract: Normal adrenal glands. Normal kidneys, without hydronephrosis. Stomach/Bowel: Grossly normal stomach and abdominal bowel loops.  Vascular/Lymphatic: Normal caliber of the aorta and branch vessels. Prominent porta hepatis nodes are likely reactive, not pathologic by size criteria. Example 11 mm. Other:  No ascites. Musculoskeletal: No acute osseous abnormality. IMPRESSION: 1. Portions of exam are mild to moderately motion degraded. 2. Diffusely abnormal appearance of the liver, worse on the left. Favored to represent intrahepatic biliary duct dilatation with numerous ductal calculi. 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 Differential considerations include primary sclerosing cholangitis, HIV cholangiopathy. Given the difficulty with visualization of intrahepatic calculi at MRI, and motion on the current exam, liver protocol CT may be informative. 3. Heterogeneous splenic lesion is favored to represent the sequelae of prior trauma (chronic hematoma) or infection. Recommend attention on follow-up. 4. Mildly prominent porta hepatis nodes, favored to be reactive. These results will be called to the ordering clinician or representative by the Radiologist Assistant, and communication documented in the PACS or zVision Dashboard. Electronically Signed   By: Abigail Miyamoto M.D.   On: 02/06/2018 14:03   Ct Liver Abdomen W Wo Contrast  Result Date: 02/06/2018 CLINICAL DATA:  Abnormal liver enzymes in a cholestatic pattern EXAM: CT ABDOMEN AND PELVIS WITHOUT AND WITH CONTRAST TECHNIQUE: Multidetector CT imaging of the abdomen and pelvis was performed following the standard protocol before and following the bolus administration of intravenous contrast. CONTRAST:  151mL ISOVUE-370 IOPAMIDOL (ISOVUE-370) INJECTION 76% COMPARISON:  02/06/2018 FINDINGS: Lower chest: Atelectasis in the lingula and right middle lobe. Airway thickening. Hepatobiliary: Again noted are irregular bile ducts especially in the left hepatic lobe with some areas of cystic dilatation probably in continuity with the bile duct as on image 29/10.  On MRI there were some low signal intensity filling defects in some of these areas suspicious for gallstones; these have relatively intermediate density on CT, and are not calcified or well appreciated does gallstones on CT, but the appearance was very compelling at for gallstones on the MRI. There are other large filling defects in the biliary tree on MRI that are less conspicuous on CT. I do not appreciate that the contents distending the biliary tree were enhancing on the subtraction images from the MRI, and accordingly intraductal tumor seems less likely. Gallbladder unremarkable. No appreciable portal vein thrombosis or patent vein thrombosis. No pneumobilia. Pancreas: Unremarkable Spleen: Large rim calcified splenic cystic lesion 8.7 by 7.0 cm, without appreciable internal enhancement on CT or on the subtraction images from prior MRI. Several additional small hypodense lesions are present in the spleen posterior to this dominant cystic lesion. Adrenals/Urinary Tract: Unremarkable Stomach/Bowel: Unremarkable Vascular/Lymphatic:  Multiple small porta hepatis lymph nodes are present. Reproductive: An IUD is present in the uterus. 4.6 cm fibroid in the uterine fundus. Ovaries unremarkable. Other: 2.9 by 2.8 cm focus of omental fat necrosis in the left lower quadrant on image 80/10 is likely chronic and has rim calcification. Musculoskeletal: Unremarkable IMPRESSION: 1. Irregular bile ducts with numerous small internal filling defects shown on recent MRI. These filling defects are intermediate density but demonstrate heterogeneity against the background biliary fluid density especially involving the left intrahepatic biliary tree. There are dilated portions and irregular portions favoring cholangitis potentially in conjunction with extensive intrahepatic stone formation. Potentially debris could have a similar appearance in the biliary tree. I do not see abnormal enhancement in the biliary tree to suggest biliary  tumor on the recent MRI. Primary sclerosing cholangitis would be a top differential diagnostic consideration. 2. Large rim calcified splenic lesion, likely a postinflammatory or posttraumatic lesion. Smaller hypodense lesions along its posterior margin are likewise probably related to the initial insult. Electronically Signed   By: Van Clines M.D.   On: 02/06/2018 21:04   US Abdomen Limited Ruq  Result Date: 02/05/2018 CLINICAL DATA:  Right upper quadrant tenderness EXAM: ULTRASOUND ABDOMEN LIMITED RIGHT UPPER QUADRANT COMPARISON:  None. FINDINGS: Gallbladder: No gallstones, gallbladder wall thickening, or pericholecystic fluid. Negative sonographic Murphy's sign. Common bile duct: Diameter: 6 mm Liver: Hyperechoic hepatic parenchyma, suggesting hepatic steatosis. 2.0 x 2.0 x 1.9 cm hyperechoic lesion in the central right hepatic lobe (image 38). Additional 2.0 x 2.8 x 2.4 cm vague hyperechoic lesion in the right hepatic lobe. Portal vein is patent on color Doppler imaging with normal direction of blood flow towards the liver. IMPRESSION: Suspected hepatic steatosis. Two hyperechoic hepatic lesions, likely reflecting benign hemangiomas, although incompletely characterized. Consider follow-up MRI abdomen with/without contrast in 3 months. Electronically Signed   By: Julian Hy M.D.   On: 02/05/2018 17:40     CBC Recent Labs  Lab 02/05/18 1401 02/06/18 0649 02/07/18 0742 02/07/18 1616 02/08/18 0000 02/08/18 0711 02/09/18 0346  WBC 8.3 8.7 9.8  --   --   --  11.8*  HGB 13.3 13.5 12.8 12.7 12.5 12.5 12.4  HCT 41.1 41.7 39.2 40.3 38.8 39.5 39.3  PLT 291 293 262  --   --   --  245  MCV 93.0 93.1 92.2  --   --   --  91.2  MCH 30.1 30.1 30.1  --   --   --  28.8  MCHC 32.4 32.4 32.7  --   --   --  31.6  RDW 13.2 13.4 13.2  --   --   --  13.2  LYMPHSABS  --  2.3 1.6  --   --   --   --   MONOABS  --  0.5 0.6  --   --   --   --   EOSABS  --  0.8* 0.5  --   --   --   --   BASOSABS  --   0.1 0.1  --   --   --   --     Chemistries  Recent Labs  Lab 02/05/18 1401 02/06/18 0649 02/07/18 0742 02/09/18 0346  NA 138 135 137 135  K 4.0 4.4 4.1 3.8  CL 103 106 104 103  CO2 27 24 25 24   GLUCOSE 111* 84 90 107*  BUN 7 5* 6 5*  CREATININE 0.75 0.73 0.76 0.82  CALCIUM 9.1 8.4* 9.1 8.6*  AST 287* 244* 124* 83*  ALT 227* 243* 172* 122*  ALKPHOS 684* 727* 661* 518*  BILITOT 1.9* 1.6* 1.3* 3.2*   ------------------------------------------------------------------------------------------------------------------ No results for input(s): CHOL, HDL, LDLCALC, TRIG, CHOLHDL, LDLDIRECT in the last 72 hours.  Lab Results  Component Value Date   HGBA1C 4.9 02/05/2018   ------------------------------------------------------------------------------------------------------------------ No results for input(s): TSH, T4TOTAL, T3FREE, THYROIDAB in the last 72 hours.  Invalid input(s): FREET3 ------------------------------------------------------------------------------------------------------------------ No results for input(s): VITAMINB12, FOLATE, FERRITIN, TIBC, IRON, RETICCTPCT in the last 72 hours.  Coagulation profile Recent Labs  Lab 02/05/18 1820  INR 0.96    No results for input(s): DDIMER in the last 72 hours.  Cardiac Enzymes No results for input(s): CKMB, TROPONINI, MYOGLOBIN in the last 168 hours.  Invalid input(s): CK ------------------------------------------------------------------------------------------------------------------ No results found for: BNP   Roxan Hockey M.D on 02/10/2018 at 12:43 PM  Pager---[phone] Go to [email or web address] - password [email or web address] for contact info  [facility] - Office  [phone]

## 2018-02-11 LAB — VITAMIN D 25 HYDROXY (VIT D DEFICIENCY, FRACTURES): Vit D, 25-Hydroxy: 33 ng/mL (ref 30.0–100.0)

## 2018-02-11 LAB — GASTRIN: GASTRIN: 40 pg/mL (ref 0–115)

## 2018-02-11 LAB — INTRINSIC FACTOR ANTIBODIES: INTRINSIC FACTOR: 1.1 [AU]/ml (ref 0.0–1.1)

## 2018-02-11 MED ORDER — SODIUM CHLORIDE 0.9 % IV SOLN
INTRAVENOUS | Status: DC
  Administered 2018-02-11: 14:00:00 via INTRAVENOUS

## 2018-02-11 NOTE — Progress Notes (Signed)
Daily Rounding Note  02/11/2018, 10:04 AM  LOS: 3 days   SUBJECTIVE:   Chief complaint: Gastritis.  Large duct liver disease.  Splenic cyst.  elvated LFTs Abdominal pain is resolved, no pain with solid foods last PM or this AM.    OBJECTIVE:         Vital signs in last 24 hours:    Temp:  [99.7 F (37.6 C)-101.7 F (38.7 C)] 100.5 F (38.1 C) (10/12 0517) Pulse Rate:  [90-102] 96 (10/12 0915) Resp:  [18] 18 (10/12 0915) BP: (106-133)/(61-83) 106/61 (10/12 0517) SpO2:  [95 %-100 %] 98 % (10/12 0915) Last BM Date: 02/10/18 Filed Weights   02/05/18 2112 02/07/18 1103 02/07/18 1827  Weight: 120.2 kg 120 kg 119.2 kg   General: pleasant,  Not acutely ill looking   Heart: RRR Chest: clear bil.  No SOB or cough. Abdomen: Obese.  Active bowel sounds.  Soft.  Slight discomfort on palpation in the left lower abdomen, the pressure makes her feel like she needs to pee but there is no visceral pain. Extremities: No CCE. Neuro/Psych: Cooperative, pleasant, no gross deficits.  Intake/Output from previous day: 10/11 0701 - 10/12 0700 In: 600 [P.O.:600] Out: 1 [Stool:1]  Intake/Output this shift: No intake/output data recorded.  Lab Results: Recent Labs    02/09/18 0346  WBC 11.8*  HGB 12.4  HCT 39.3  PLT 245   BMET Recent Labs    02/09/18 0346  NA 135  K 3.8  CL 103  CO2 24  GLUCOSE 107*  BUN 5*  CREATININE 0.82  CALCIUM 8.6*   LFT Recent Labs    02/09/18 0346  PROT 8.4*  ALBUMIN 2.8*  AST 83*  ALT 122*  ALKPHOS 518*  BILITOT 3.2*  BILIDIR 1.7*   PT/INR No results for input(s): LABPROT, INR in the last 72 hours. Hepatitis Panel No results for input(s): HEPBSAG, HCVAB, HEPAIGM, HEPBIGM in the last 72 hours.  Studies/Results: Dg Chest 2 View  Result Date: 02/10/2018 CLINICAL DATA:  Fever for 2 days. EXAM: CHEST - 2 VIEW COMPARISON:  None. FINDINGS: The heart size and mediastinal contours are  within normal limits. Mild patchy opacity of right lung base is identified. There is no pulmonary edema or pleural effusion. The visualized skeletal structures are unremarkable. IMPRESSION: Mild patchy opacity of right lung base, developing pneumonia is not excluded. Electronically Signed   By: Abelardo Diesel M.D.   On: 02/10/2018 11:53   Scheduled Meds: . azithromycin  500 mg Oral Daily  . enoxaparin (LOVENOX) injection  40 mg Subcutaneous Q24H  . loratadine  10 mg Oral Daily  . LORazepam  0.5 mg Intravenous Once  . mometasone-formoterol  2 puff Inhalation BID  . montelukast  10 mg Oral QHS  . pantoprazole  40 mg Oral BID  . sodium chloride flush  3 mL Intravenous Q12H  . sucralfate  1 g Oral TID WC & HS   Continuous Infusions: . cefTRIAXone (ROCEPHIN)  IV Stopped (02/10/18 1652)   PRN Meds:.albuterol, hydrOXYzine, ondansetron (ZOFRAN) IV, oxyCODONE, traZODone   ASSESMENT:   *Post prandial epigastric pain. improved.  Tolerating limited p.o.'s but she is not hungry at all so she is not consuming much. 10/8 EGD: gastritis, gastric erosion.  Pathology:   Chronic gastritis with goblet cell metaplasia.  No H. Pylori Protonix 40 POBID, Carafate in place.  Splenic cyst may be cause of PP pain.    * Abnormal LFTs, chronic  for several years. Trending down over last 4 days with exception of T bili.  Abnormal appearance bile ducts per CT. 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 IgG 1849 elevated IgM 249, elevated IgA 294, normal AMA <20, normal.  C-ANCA titer <1:20 P-ANCA titer <1:20 Acute hepatitis panel normal. ANA negative  Anti-parietal cell Ab pending Gastrin level pending.   Intrinsic factor Ab pending.    HIDA scan: patent cystic duct.  Hepatocellular dysfx, prolonged retention of tracer in liver.  Non vis of tracer in SB, so not able to establish patency of CBD.    *    UTI.  E coli growing from urine and blood culture.  On PCR ID panel  Enterobacter and E. coli detected. On Rocephin, Azithromycin.    Fevers not yet resolved.  Overnight temp to 101.5. WBCs 11.8 today, were 9.8 three days previously.  ? Could she have concomittant  E coli cholangitis?      PLAN   *  Total 2 weeks of Carafate.  8 weeks Protonix 40 mg PO BID, then drop to 1x daily.      Monique Harrison  02/11/2018, 10:04 AM Phone [phone]

## 2018-02-11 NOTE — Progress Notes (Signed)
Patient Demographics:    Monique Harrison, is a 42 y.o. female, DOB - 1975-10-04, KYH:[id number]  Admit date - 02/05/2018   Admitting Physician Vilma Prader, MD  Outpatient Primary MD for the patient is Patient, No Pcp Per  LOS - 3   Chief Complaint  Patient presents with  . Abdominal Pain        Subjective:    [name] today has nausea, no emesis, fevers and chills persist, no dysuria, no flank pain, T-max 101.7 T-current 100.5  Assessment  & Plan :    Principal Problem:   E coli bacteremia Active Problems:   Abdominal pain   Primary sclerosing cholangitis   Hypotension   Gastritis with hemorrhage   Hemorrhagic gastritis   Cyst of spleen   Rt Sided CAP (community acquired pneumonia)   Right upper quadrant abdominal tenderness without rebound tenderness   Elevated LFTs  Brief Summary Monique Harrison is an 42 y.o. female with medical history significant ofasthma, allergies, and obesity admitted 02/05/18 with abdominal pain and found to have elevated LFTs. GGT was markedly elevated. An MRI and MRA was ordered and found to be markedly abnormal suggestive of primary biliary cirrhosis, hepatitis, chronic pyogenic cholangitis or hepatic steatosis. Evaluation by GI including EGD-- with Gastritis with hemorrhage and Single erosion.    Plan:- 1) E. coli bacteremia and E. coli UTI-  blood cultures from 02/09/18 positive for E. Coli, continue IV Rocephin 2 g every 24 hours (started 02/10/18) pending final ID and sensitivity of the blood cultures, fevers persist see subjective section above  2) possible right-sided pneumonia----stable, continue IV Rocephin and azithromycin, clinically low index of suspicion for pneumonia, Blood cultures growing E. coli which is not a typical pulmonary pathogen  3)Elevated LFTs--- ????? Primary biliary cirrhosis/Possible primary sclerosing cholangitis,  hepatitis, chronic pyogenic cholangitis or hepatic steatosis. 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  GI states that defer consideration of liver biopsy to that setting.  4)Gastritis with Hemorrhage and single erosion--- continue Protonix twice daily for 8 weeks, Carafate for 2 weeks, gastric biopsy without H. pylori, no dysplasia or malignancy  5)Abd Pain--- most likely secondary to cholangitis, blood cultures with E. coli which may be from the urine however concomitant cholangitis cannot be excluded.  Has large splenic cyst found on CT, gastritis may be contributing to patient's epigastric discomfort,  HIDA scan without acute findings  6)Obesity with Dyslipidemia with Xanthelasmas--- LDH 219, hold off on statin Rx given elevated LFTs  7)FEN---  poor intake, has nausea and abdominal pain, continue IV fluids  Disposition/Need for in-Hospital Stay- patient unable to be discharged at this time due to persistent fevers in the setting of E. coli bacteremia requiring IV antibiotics pending final ID and sensitivity of blood cultures    Code Status : Full   Disposition Plan  : home  Consults  :  Gi  DVT Prophylaxis  :  Lovenox    Lab Results  Component Value Date   PLT 245 02/09/2018    Inpatient Medications  Scheduled Meds: . azithromycin  500 mg Oral Daily  . enoxaparin (LOVENOX) injection  40 mg Subcutaneous Q24H  . loratadine  10 mg Oral Daily  . LORazepam  0.5 mg Intravenous Once  . mometasone-formoterol  2 puff Inhalation BID  . montelukast  10 mg Oral QHS  . pantoprazole  40 mg Oral BID  . sodium chloride flush  3 mL Intravenous Q12H  . sucralfate  1 g Oral TID WC & HS    Continuous Infusions: . cefTRIAXone (ROCEPHIN)  IV Stopped (02/10/18 1652)   PRN Meds:.albuterol, hydrOXYzine, ondansetron (ZOFRAN) IV, oxyCODONE, traZODone    Anti-infectives (From admission, onward)   Start     Dose/Rate Route Frequency Ordered Stop   02/10/18 1400  azithromycin (ZITHROMAX) tablet 500 mg     500 mg Oral Daily 02/10/18 1335     02/10/18 1245  cefTRIAXone (ROCEPHIN) 2 g in sodium chloride 0.9 % 100 mL IVPB     2 g 200 mL/hr over 30 Minutes Intravenous Every 24 hours 02/10/18 1239 02/17/18 1244   02/10/18 1245  azithromycin (ZITHROMAX) 500 mg in sodium chloride 0.9 % 250 mL IVPB  Status:  Discontinued     500 mg 250 mL/hr over 60 Minutes Intravenous Every 24 hours 02/10/18 1239 02/10/18 1335   02/10/18 0930  piperacillin-tazobactam (ZOSYN) IVPB 3.375 g  Status:  Discontinued     3.375 g 12.5 mL/hr over 240 Minutes Intravenous Every 8 hours 02/10/18 0919 02/10/18 1239        Objective:   Vitals:   02/10/18 2125 02/11/18 0002 02/11/18 0517 02/11/18 0915  BP: 133/83  106/61   Pulse: (!) 102  98 96  Resp:    18  Temp: (!) 101.7 F (38.7 C) 100.3 F (37.9 C) (!) 100.5 F (38.1 C)   TempSrc:  Oral    SpO2: 99%  95% 98%  Weight:      Height:        Wt Readings from Last 3 Encounters:  02/07/18 119.2 kg  12/25/17 124.7 kg  05/13/17 122 kg     Intake/Output Summary (Last 24 hours) at 02/11/2018 1203 Last data filed at 02/11/2018 1018 Gross per 24 hour  Intake 760 ml  Output -  Net 760 ml    Physical Exam Patient is examined daily including today on 02/11/18 , exams remain the same as of yesterday except that has changed   Gen:- Awake Alert,  In no apparent distress  HEENT:- [place].AT, No sclera icterus Neck-Supple Neck,No JVD,.  Lungs-mostly clear, and movement is fair and symmetrical  CV- S1, S2 normal, regular Abd-  +ve B.Sounds, Abd Soft, epigastric and left upper quadrant area discomfort without rebound or guarding, , no CVA tenderness,   Extremity/Skin:- No  edema,   good pulses Psych-affect is appropriate, oriented x3 Neuro-no new focal deficits, no tremors   Data Review:   Micro Results Recent Results (from the past 240 hour(s))  Culture, blood (Routine X 2) w Reflex to ID Panel     Status: Abnormal (Preliminary result)   Collection Time: 02/09/18  3:45 PM  Result Value Ref Range Status   Specimen Description BLOOD LEFT ANTECUBITAL  Final   Special Requests   Final    BOTTLES DRAWN AEROBIC ONLY Blood Culture adequate volume   Culture  Setup Time   Final    GRAM NEGATIVE RODS AEROBIC BOTTLE ONLY CRITICAL RESULT CALLED TO, READ BACK BY AND VERIFIED WITH: M. Clare, AT 4098 02/10/18 BY D.  VANHOOK Organism ID to follow    Culture (A)  Final    ESCHERICHIA COLI SUSCEPTIBILITIES TO FOLLOW Performed at [facility], [place] [address] [place], [place] [address]    Report Status PENDING  Incomplete  Blood Culture ID Panel (Reflexed)     Status: Abnormal   Collection Time: 02/09/18  3:45 PM  Result Value Ref Range Status   Enterococcus species NOT DETECTED NOT DETECTED Final   Vancomycin resistance NOT DETECTED NOT DETECTED Final   Listeria monocytogenes NOT DETECTED NOT DETECTED Final   Staphylococcus species NOT DETECTED NOT DETECTED Final   Staphylococcus aureus (BCID) NOT DETECTED NOT DETECTED Final   Methicillin resistance NOT DETECTED NOT DETECTED Final   Streptococcus species NOT DETECTED NOT DETECTED Final   Streptococcus agalactiae NOT DETECTED NOT DETECTED Final   Streptococcus pneumoniae NOT DETECTED NOT DETECTED Final   Streptococcus pyogenes NOT DETECTED NOT DETECTED Final   Acinetobacter baumannii NOT DETECTED NOT DETECTED Final   Enterobacteriaceae species DETECTED (A) NOT DETECTED Final    Comment: CRITICAL RESULT CALLED TO, READ BACK BY AND VERIFIED WITH: Dawson Bills PharmD 11:55 02/10/18 ([email or web address]) Enterobacteriaceae represent a large family of gram-negative bacteria, not a single  organism.    Enterobacter cloacae complex NOT DETECTED NOT DETECTED Final   Escherichia coli DETECTED (A) NOT DETECTED Final    Comment: CRITICAL RESULT CALLED TO, READ BACK BY AND VERIFIED WITH: Dawson Bills PharmD 11:55 02/10/18 ([email or web address])    Klebsiella oxytoca NOT DETECTED NOT DETECTED Final   Klebsiella pneumoniae NOT DETECTED NOT DETECTED Final   Proteus species NOT DETECTED NOT DETECTED Final   Serratia marcescens NOT DETECTED NOT DETECTED Final   Carbapenem resistance NOT DETECTED NOT DETECTED Final   Haemophilus influenzae NOT DETECTED NOT DETECTED Final   Neisseria meningitidis NOT DETECTED NOT DETECTED Final   Pseudomonas aeruginosa NOT DETECTED NOT DETECTED Final   Candida albicans NOT DETECTED NOT DETECTED Final   Candida glabrata NOT DETECTED NOT DETECTED Final   Candida krusei NOT DETECTED NOT DETECTED Final   Candida parapsilosis NOT DETECTED NOT DETECTED Final   Candida tropicalis NOT DETECTED NOT DETECTED Final    Comment: Performed at [facility], [place] [address], [place], [place] [address]  Culture, Urine     Status: Abnormal (Preliminary result)   Collection Time: 02/10/18  1:58 AM  Result Value Ref Range Status   Specimen Description URINE, CLEAN CATCH  Final   Special Requests NONE  Final   Culture (A)  Final    >=100,000 COLONIES/mL ESCHERICHIA COLI SUSCEPTIBILITIES TO FOLLOW Performed at [facility], [address], [place], [place] [address]    Report Status PENDING  Incomplete  Urine Culture     Status: Abnormal (Preliminary result)   Collection Time: 02/10/18 12:46 PM  Result Value Ref Range Status   Specimen Description URINE, CLEAN CATCH  Final   Special Requests Normal  Final   Culture (A)  Final    80,000 COLONIES/mL ESCHERICHIA COLI SUSCEPTIBILITIES TO FOLLOW Performed at [facility], [place] [address], [place], [place] [address]    Report Status PENDING  Incomplete    Radiology Reports Dg Chest 2 View  Result Date:  02/10/2018 CLINICAL DATA:  Fever for 2 days. EXAM: CHEST - 2 VIEW COMPARISON:  None. FINDINGS: The heart size and mediastinal contours are within normal limits. Mild patchy opacity of right lung base is identified. There is no pulmonary edema or pleural effusion. The visualized skeletal structures  are unremarkable. IMPRESSION: Mild patchy opacity of right lung base, developing pneumonia is not excluded. Electronically Signed   By: Abelardo Diesel M.D.   On: 02/10/2018 11:53   Mr 3d Recon At Scanner  Result Date: 02/06/2018 CLINICAL DATA:  Abnormal liver function tests.  Abdominal pain. EXAM: MRI ABDOMEN WITHOUT AND WITH CONTRAST (INCLUDING MRCP) TECHNIQUE: Multiplanar multisequence MR imaging of the abdomen was performed both before and after the administration of intravenous contrast. Heavily T2-weighted images of the biliary and pancreatic ducts were obtained, and three-dimensional MRCP images were rendered by post processing. CONTRAST:  10 cc Gadavist COMPARISON:  02/05/2018 abdominal ultrasound. This demonstrated hyperechoic liver lesions. FINDINGS: Portions of exam are mild to moderately motion degraded. Lower chest: Normal heart size without pericardial or pleural effusion. Hepatobiliary: Abnormal appearance of the liver, with central areas of tubular mixed T2 signal, highly suspicious for dilated intrahepatic ducts with ductal calculi. Example images 14/8, 13/4, 10/14, and 44/12. These are most conspicuous within the left hepatic lobe. Somewhat more masslike peripheral lateral segment left liver lobe T2 hyperintensity, including on image 15/4, measures 1.7 cm and is favored to represent focal intrahepatic duct dilatation. Similarly, T2 hypointensity at 1.8 cm in the right hepatic lobe on 16/8 is favored to be related to focal intrahepatic duct dilatation and intrahepatic ductal stones within. No gallstones or common duct dilatation. The common duct is upper normal in size for age at 7 mm. Pancreas:   Normal, without mass or ductal dilatation. Spleen: Nonenhancing anterior splenic lesion measures 8.0 x 6.1 cm on image 12/4. Peripheral T2 hypointensity with heterogeneous central T2 signal. No post-contrast enhancement. Smaller nonspecific T2 hyperintense posterior splenic lesions. No splenomegaly. Adrenals/Urinary Tract: Normal adrenal glands. Normal kidneys, without hydronephrosis. Stomach/Bowel: Grossly normal stomach and abdominal bowel loops. Vascular/Lymphatic: Normal caliber of the aorta and branch vessels. Prominent porta hepatis nodes are likely reactive, not pathologic by size criteria. Example 11 mm. Other:  No ascites. Musculoskeletal: No acute osseous abnormality. IMPRESSION: 1. Portions of exam are mild to moderately motion degraded. 2. Diffusely abnormal appearance of the liver, worse on the left. Favored to represent intrahepatic biliary duct dilatation with numerous ductal calculi. 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 Differential considerations include primary sclerosing cholangitis, HIV cholangiopathy. Given the difficulty with visualization of intrahepatic calculi at MRI, and motion on the current exam, liver protocol CT may be informative. 3. Heterogeneous splenic lesion is favored to represent the sequelae of prior trauma (chronic hematoma) or infection. Recommend attention on follow-up. 4. Mildly prominent porta hepatis nodes, favored to be reactive. These results will be called to the ordering clinician or representative by the Radiologist Assistant, and communication documented in the PACS or zVision Dashboard. Electronically Signed   By: Abigail Miyamoto M.D.   On: 02/06/2018 14:03   Nm Hepato W/eject Fract  Result Date: 02/09/2018 CLINICAL DATA:  Acute abdominal pain, epigastric pain, elevated LFTs, abnormal appearance of liver by MR EXAM: NUCLEAR MEDICINE HEPATOBILIARY IMAGING TECHNIQUE: Sequential images of the abdomen were obtained out to  60 minutes following intravenous administration of radiopharmaceutical. RADIOPHARMACEUTICALS:  5.4 mCi Tc-68m  Choletec IV Pharmaceutical: Morphine Sulfate 4 mg IV after 90 minutes of imaging COMPARISON:  CT abdomen and pelvis 02/06/2018 FINDINGS: Prompt clearance of tracer from bloodstream. Delayed excretion of tracer into the biliary tree. Gallbladder faintly visualized at 75 minutes period. At 90 minutes, CBD and small bowel were not visualized. Patient received morphine and imaging was continued for 30 minutes. Gallbladder better visualizes with  tracer following morphine augmentation. No small bowel visualization throughout the exam. Prolonged visualization of patent parenchyma throughout the study with overall poor excretion of tracer into the biliary tree compatible with hepatocellular dysfunction. IMPRESSION: Diffuse hepatocellular dysfunction with poor biliary excretion of tracer and prolonged hepatic parenchymal retention of tracer throughout exam. Patent cystic duct. Nonvisualization of tracer within the small bowel prior to morphine augmentation, unable to establish CBD patency. Electronically Signed   By: Lavonia Dana M.D.   On: 02/09/2018 09:30   Mr Abdomen Mrcp Moise Boring Contast  Result Date: 02/06/2018 CLINICAL DATA:  Abnormal liver function tests.  Abdominal pain. EXAM: MRI ABDOMEN WITHOUT AND WITH CONTRAST (INCLUDING MRCP) TECHNIQUE: Multiplanar multisequence MR imaging of the abdomen was performed both before and after the administration of intravenous contrast. Heavily T2-weighted images of the biliary and pancreatic ducts were obtained, and three-dimensional MRCP images were rendered by post processing. CONTRAST:  10 cc Gadavist COMPARISON:  02/05/2018 abdominal ultrasound. This demonstrated hyperechoic liver lesions. FINDINGS: Portions of exam are mild to moderately motion degraded. Lower chest: Normal heart size without pericardial or pleural effusion. Hepatobiliary: Abnormal appearance of the  liver, with central areas of tubular mixed T2 signal, highly suspicious for dilated intrahepatic ducts with ductal calculi. Example images 14/8, 13/4, 10/14, and 44/12. These are most conspicuous within the left hepatic lobe. Somewhat more masslike peripheral lateral segment left liver lobe T2 hyperintensity, including on image 15/4, measures 1.7 cm and is favored to represent focal intrahepatic duct dilatation. Similarly, T2 hypointensity at 1.8 cm in the right hepatic lobe on 16/8 is favored to be related to focal intrahepatic duct dilatation and intrahepatic ductal stones within. No gallstones or common duct dilatation. The common duct is upper normal in size for age at 7 mm. Pancreas:  Normal, without mass or ductal dilatation. Spleen: Nonenhancing anterior splenic lesion measures 8.0 x 6.1 cm on image 12/4. Peripheral T2 hypointensity with heterogeneous central T2 signal. No post-contrast enhancement. Smaller nonspecific T2 hyperintense posterior splenic lesions. No splenomegaly. Adrenals/Urinary Tract: Normal adrenal glands. Normal kidneys, without hydronephrosis. Stomach/Bowel: Grossly normal stomach and abdominal bowel loops. Vascular/Lymphatic: Normal caliber of the aorta and branch vessels. Prominent porta hepatis nodes are likely reactive, not pathologic by size criteria. Example 11 mm. Other:  No ascites. Musculoskeletal: No acute osseous abnormality. IMPRESSION: 1. Portions of exam are mild to moderately motion degraded. 2. Diffusely abnormal appearance of the liver, worse on the left. Favored to represent intrahepatic biliary duct dilatation with numerous ductal calculi. 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 Differential considerations include primary sclerosing cholangitis, HIV cholangiopathy. Given the difficulty with visualization of intrahepatic calculi at MRI, and motion on the current exam, liver protocol CT may be informative. 3. Heterogeneous splenic  lesion is favored to represent the sequelae of prior trauma (chronic hematoma) or infection. Recommend attention on follow-up. 4. Mildly prominent porta hepatis nodes, favored to be reactive. These results will be called to the ordering clinician or representative by the Radiologist Assistant, and communication documented in the PACS or zVision Dashboard. Electronically Signed   By: Abigail Miyamoto M.D.   On: 02/06/2018 14:03   Ct Liver Abdomen W Wo Contrast  Result Date: 02/06/2018 CLINICAL DATA:  Abnormal liver enzymes in a cholestatic pattern EXAM: CT ABDOMEN AND PELVIS WITHOUT AND WITH CONTRAST TECHNIQUE: Multidetector CT imaging of the abdomen and pelvis was performed following the standard protocol before and following the bolus administration of intravenous contrast. CONTRAST:  165mL ISOVUE-370 IOPAMIDOL (ISOVUE-370) INJECTION 76%  COMPARISON:  02/06/2018 FINDINGS: Lower chest: Atelectasis in the lingula and right middle lobe. Airway thickening. Hepatobiliary: Again noted are irregular bile ducts especially in the left hepatic lobe with some areas of cystic dilatation probably in continuity with the bile duct as on image 29/10. On MRI there were some low signal intensity filling defects in some of these areas suspicious for gallstones; these have relatively intermediate density on CT, and are not calcified or well appreciated does gallstones on CT, but the appearance was very compelling at for gallstones on the MRI. There are other large filling defects in the biliary tree on MRI that are less conspicuous on CT. I do not appreciate that the contents distending the biliary tree were enhancing on the subtraction images from the MRI, and accordingly intraductal tumor seems less likely. Gallbladder unremarkable. No appreciable portal vein thrombosis or patent vein thrombosis. No pneumobilia. Pancreas: Unremarkable Spleen: Large rim calcified splenic cystic lesion 8.7 by 7.0 cm, without appreciable internal  enhancement on CT or on the subtraction images from prior MRI. Several additional small hypodense lesions are present in the spleen posterior to this dominant cystic lesion. Adrenals/Urinary Tract: Unremarkable Stomach/Bowel: Unremarkable Vascular/Lymphatic: Multiple small porta hepatis lymph nodes are present. Reproductive: An IUD is present in the uterus. 4.6 cm fibroid in the uterine fundus. Ovaries unremarkable. Other: 2.9 by 2.8 cm focus of omental fat necrosis in the left lower quadrant on image 80/10 is likely chronic and has rim calcification. Musculoskeletal: Unremarkable IMPRESSION: 1. Irregular bile ducts with numerous small internal filling defects shown on recent MRI. These filling defects are intermediate density but demonstrate heterogeneity against the background biliary fluid density especially involving the left intrahepatic biliary tree. There are dilated portions and irregular portions favoring cholangitis potentially in conjunction with extensive intrahepatic stone formation. Potentially debris could have a similar appearance in the biliary tree. I do not see abnormal enhancement in the biliary tree to suggest biliary tumor on the recent MRI. Primary sclerosing cholangitis would be a top differential diagnostic consideration. 2. Large rim calcified splenic lesion, likely a postinflammatory or posttraumatic lesion. Smaller hypodense lesions along its posterior margin are likewise probably related to the initial insult. Electronically Signed   By: Van Clines M.D.   On: 02/06/2018 21:04   US Abdomen Limited Ruq  Result Date: 02/05/2018 CLINICAL DATA:  Right upper quadrant tenderness EXAM: ULTRASOUND ABDOMEN LIMITED RIGHT UPPER QUADRANT COMPARISON:  None. FINDINGS: Gallbladder: No gallstones, gallbladder wall thickening, or pericholecystic fluid. Negative sonographic Murphy's sign. Common bile duct: Diameter: 6 mm Liver: Hyperechoic hepatic parenchyma, suggesting hepatic steatosis. 2.0 x  2.0 x 1.9 cm hyperechoic lesion in the central right hepatic lobe (image 38). Additional 2.0 x 2.8 x 2.4 cm vague hyperechoic lesion in the right hepatic lobe. Portal vein is patent on color Doppler imaging with normal direction of blood flow towards the liver. IMPRESSION: Suspected hepatic steatosis. Two hyperechoic hepatic lesions, likely reflecting benign hemangiomas, although incompletely characterized. Consider follow-up MRI abdomen with/without contrast in 3 months. Electronically Signed   By: Julian Hy M.D.   On: 02/05/2018 17:40     CBC Recent Labs  Lab 02/05/18 1401 02/06/18 0649 02/07/18 0742 02/07/18 1616 02/08/18 0000 02/08/18 0711 02/09/18 0346  WBC 8.3 8.7 9.8  --   --   --  11.8*  HGB 13.3 13.5 12.8 12.7 12.5 12.5 12.4  HCT 41.1 41.7 39.2 40.3 38.8 39.5 39.3  PLT 291 293 262  --   --   --  245  MCV 93.0 93.1 92.2  --   --   --  91.2  MCH 30.1 30.1 30.1  --   --   --  28.8  MCHC 32.4 32.4 32.7  --   --   --  31.6  RDW 13.2 13.4 13.2  --   --   --  13.2  LYMPHSABS  --  2.3 1.6  --   --   --   --   MONOABS  --  0.5 0.6  --   --   --   --   EOSABS  --  0.8* 0.5  --   --   --   --   BASOSABS  --  0.1 0.1  --   --   --   --     Chemistries  Recent Labs  Lab 02/05/18 1401 02/06/18 0649 02/07/18 0742 02/09/18 0346  NA 138 135 137 135  K 4.0 4.4 4.1 3.8  CL 103 106 104 103  CO2 27 24 25 24   GLUCOSE 111* 84 90 107*  BUN 7 5* 6 5*  CREATININE 0.75 0.73 0.76 0.82  CALCIUM 9.1 8.4* 9.1 8.6*  AST 287* 244* 124* 83*  ALT 227* 243* 172* 122*  ALKPHOS 684* 727* 661* 518*  BILITOT 1.9* 1.6* 1.3* 3.2*   ------------------------------------------------------------------------------------------------------------------ No results for input(s): CHOL, HDL, LDLCALC, TRIG, CHOLHDL, LDLDIRECT in the last 72 hours.  Lab Results  Component Value Date   HGBA1C 4.9 02/05/2018    ------------------------------------------------------------------------------------------------------------------ No results for input(s): TSH, T4TOTAL, T3FREE, THYROIDAB in the last 72 hours.  Invalid input(s): FREET3 ------------------------------------------------------------------------------------------------------------------ No results for input(s): VITAMINB12, FOLATE, FERRITIN, TIBC, IRON, RETICCTPCT in the last 72 hours.  Coagulation profile Recent Labs  Lab 02/05/18 1820  INR 0.96    No results for input(s): DDIMER in the last 72 hours.  Cardiac Enzymes No results for input(s): CKMB, TROPONINI, MYOGLOBIN in the last 168 hours.  Invalid input(s): CK ------------------------------------------------------------------------------------------------------------------ No results found for: BNP   Roxan Hockey M.D on 02/11/2018 at 12:03 PM  Pager---[phone] Go to [email or web address] - password [email or web address] for contact info  [facility] - Office  [phone]

## 2018-02-12 LAB — COMPREHENSIVE METABOLIC PANEL
ALK PHOS: 504 U/L — AB (ref 38–126)
ALT: 91 U/L — ABNORMAL HIGH (ref 0–44)
ANION GAP: 11 (ref 5–15)
AST: 82 U/L — ABNORMAL HIGH (ref 15–41)
Albumin: 2.6 g/dL — ABNORMAL LOW (ref 3.5–5.0)
BILIRUBIN TOTAL: 2.2 mg/dL — AB (ref 0.3–1.2)
BUN: 7 mg/dL (ref 6–20)
CALCIUM: 8.5 mg/dL — AB (ref 8.9–10.3)
CO2: 22 mmol/L (ref 22–32)
Chloride: 103 mmol/L (ref 98–111)
Creatinine, Ser: 0.77 mg/dL (ref 0.44–1.00)
GFR calc non Af Amer: >60 mL/min (ref >60)
Glucose, Bld: 105 mg/dL — ABNORMAL HIGH (ref 70–99)
Potassium: 3.5 mmol/L (ref 3.5–5.1)
Sodium: 136 mmol/L (ref 135–145)
TOTAL PROTEIN: 7.4 g/dL (ref 6.5–8.1)

## 2018-02-12 LAB — URINE CULTURE
Culture: 80000 — AB
Special Requests: NORMAL

## 2018-02-12 LAB — CBC
HEMATOCRIT: 37.5 % (ref 36.0–46.0)
HEMOGLOBIN: 12.5 g/dL (ref 12.0–15.0)
MCH: 29.6 pg (ref 26.0–34.0)
MCHC: 33.3 g/dL (ref 30.0–36.0)
MCV: 88.7 fL (ref 80.0–100.0)
Platelets: 259 10*3/uL (ref 150–400)
RBC: 4.23 MIL/uL (ref 3.87–5.11)
RDW: 13.2 % (ref 11.5–15.5)
WBC: 8.4 10*3/uL (ref 4.0–10.5)
nRBC: 0 % (ref 0.0–0.2)

## 2018-02-12 LAB — CULTURE, BLOOD (ROUTINE X 2): SPECIAL REQUESTS: ADEQUATE

## 2018-02-12 MED ORDER — HYDROXYZINE HCL 25 MG PO TABS: 25.0000 mg | ORAL_TABLET | Freq: Four times a day (QID) | ORAL | 0 refills | Status: DC | PRN

## 2018-02-12 MED ORDER — SUCRALFATE 1 G PO TABS: 1.0000 g | ORAL_TABLET | Freq: Three times a day (TID) | ORAL | 0 refills | Status: DC

## 2018-02-12 MED ORDER — INFLUENZA VAC SPLIT QUAD 0.5 ML IM SUSY: 0.5000 mL | PREFILLED_SYRINGE | INTRAMUSCULAR | Status: DC

## 2018-02-12 MED ORDER — PANTOPRAZOLE SODIUM 40 MG PO TBEC: 40.0000 mg | DELAYED_RELEASE_TABLET | Freq: Two times a day (BID) | ORAL | 2 refills | Status: DC

## 2018-02-12 MED ORDER — CEFDINIR 300 MG PO CAPS: 300.0000 mg | ORAL_CAPSULE | Freq: Two times a day (BID) | ORAL | 0 refills | Status: DC

## 2018-02-12 MED ORDER — MONTELUKAST SODIUM 10 MG PO TABS: 10.0000 mg | ORAL_TABLET | Freq: Every day | ORAL | 5 refills | Status: DC

## 2018-02-12 NOTE — Discharge Summary (Signed)
Monique Harrison, is a 42 y.o. female  DOB 1976-02-22  MRN [id number].  Admission date:  02/05/2018  Admitting Physician  Monique Prader, MD  Discharge Date:  02/12/2018   Primary MD  Patient, No Pcp Per  Recommendations for primary care physician for things to follow:   1) take medications including Omnicef antibiotic as prescribed 2) you have stomach irritations/gastritis/erosions so Avoid ibuprofen/Advil/Aleve/Motrin/Goody Powders/Naproxen/BC powders/Meloxicam/Diclofenac/Indomethacin and other Nonsteroidal anti-inflammatory medications as these will make you more likely to bleed and can cause stomach ulcers, can also cause Kidney problems.  3) please try make an appointment with a local primary care physician, who hopefully can refer you to [facility] gastroenterology department/hepatology department for further evaluation of your liver and biliary system 4) avoid excessive amount of Tylenol due to concerns about your liver  Admission Diagnosis  Elevated LFTs [R94.5] RUQ abdominal tenderness [R10.811]   Discharge Diagnosis  Elevated LFTs [R94.5] RUQ abdominal tenderness [R10.811]    Principal Problem:   E coli bacteremia Active Problems:   Abdominal pain   Primary sclerosing cholangitis   Hypotension   Gastritis with hemorrhage   Hemorrhagic gastritis   Cyst of spleen   Rt Sided CAP (community acquired pneumonia)   Right upper quadrant abdominal tenderness without rebound tenderness   Elevated LFTs      Past Medical History:  Diagnosis Date  . Abnormal LFTs 2005  . Asthma   . Recurrent upper respiratory infection (URI)   . Upper GI bleed 2016   [place], [place]; presented with melena; admitted for 1 week; thought to be due to frequent Naproxen use  . Vitamin D deficiency     Past Surgical History:  Procedure Laterality Date  . BIOPSY  02/07/2018   Procedure: BIOPSY;  Surgeon: Monique Park, MD;  Location: [facility] ENDOSCOPY;  Service: Gastroenterology;;  . ESOPHAGOGASTRODUODENOSCOPY (EGD) WITH PROPOFOL N/A 02/07/2018   Procedure: ESOPHAGOGASTRODUODENOSCOPY (EGD) WITH PROPOFOL;  Surgeon: Monique Park, MD;  Location: [place];  Service: Gastroenterology;  Laterality: N/A;  . NO PAST SURGERIES         HPI  from the history and physical done on the day of admission:   Chief Complaint: Abdominal pain  HPI: This is a 42 year old woman with medical problems including asthma, allergies, obesity, presenting with abdominal pain.  History is obtained via patient report as well as husband and daughter at the bedside.  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  Never smoker.  She reports onset of symptoms 2 weeks ago, initially epigastric pain thought to be indigestion, also pain in the right upper quadrant.  She drank baking soda plus water and had nonbilious nonbloody emesis.  Her pain and symptoms improved but recurred intermittently every few days.  Her pain lasts for hours, described as a dull, located in the epigastrium and right upper quadrant, nonradiating.  Her pain was not alleviated with Mylanta.  She reports over-the-counter medications including magnesium and vitamin  D, does not report recent NSAID use.  Due to her persistent symptoms decided to seek medical attention today.  Associated symptoms include nausea.  Specifically denies constipation, fevers, chills, chest pain, shortness of breath, weight changes.  She reports she does not have a primary care physician, does report falling with a local allergy specialist.  She has not required regular albuterol rescue inhaler use.  She does report she was intubated in the 1990s for asthma exacerbation, but recently has been under better control.  No longer on interleukin directed therapy due to it not helping.  ED Course: In the emergency  department vital signs were unremarkable, systolic blood pressure 759.  CBC and CMP were remarkable for AST/ALT of 287 and 227 respectively.  Alk phos of 684.  Total bilirubin 1.9.  Total protein 7.6, albumin 3.  Previous test negative.  Lipase normal.  Right upper quadrant ultrasound revealed hepatic steatosis as well as evidence of benign hemangiomas.  Emergency medicine team discussed case with gastroenterology consult service who recommended MRCP and MRI of the abdomen, and admission for further management.  Hospital medicine consulted for further management.  PTT and PT/INR normal.    Hospital Course:    Brief Summary Monique Harrison an 42 y.o.femalewith medical history significant ofasthma, allergies, and obesity admitted 02/05/18 with abdominal pain and found to have elevated LFTs.GGT was markedly elevated. An MRI and MRA was ordered and found to be markedly abnormal suggestive of primary biliary cirrhosis, hepatitis, chronic pyogenic cholangitis or hepatic steatosis. Evaluation by GI including EGD-- with Gastritis with hemorrhage and Single erosion.    Plan:- 1) E. coli bacteremia and E. coli UTI-  blood cultures from 02/09/18 and urine cultures from 02/10/2017 positive for mostly pansensitive E. Coli,  treated with IV Rocephin 2 g every 24 hours (started 02/10/18) , okay to discharge home on Omnicef 300 mg twice daily for 10 days for E. coli bacteremia, patient has been afebrile without further chills or Rigors  2) possible right-sided pneumonia----stable, treated with IV Rocephin and azithromycin, clinically low index of suspicion for pneumonia, Blood cultures grew E. coli which is not a typical pulmonary pathogen, Omnicef as above #1  3)Elevated LFTs--- ????? Primary biliary cirrhosis/Possible primary sclerosing cholangitis, hepatitis, chronic pyogenic cholangitis or hepatic steatosis. 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  GI states that defer consideration of liver biopsy to [facility] hepatology department  4)Gastritis with Hemorrhage and single erosion--- continue Protonix twice daily for 8 weeks, Carafate for 2 weeks, gastric biopsy without H. pylori, no dysplasia or malignancy  5)Abd Pain--- most likely secondary to cholangitis, blood cultures with E. coli which may be from the urine however concomitant cholangitis cannot be excluded.  Has large splenic cyst found on CT, gastritis may be contributing to patient's epigastric discomfort,  HIDA scan without ACUTE  findings  6)Obesity with Dyslipidemia with Xanthelasmas--- LDH 219, hold off on statin Rx given elevated LFTs    Code Status : Full   Disposition Plan  : home  Consults  :  Gi  Discharge Condition: stable  Follow UP--- [facility] gastroenterology/hepatology department   Diet and Activity recommendation:  As advised  Discharge Instructions    Discharge Instructions    Call MD for:  difficulty breathing, headache or visual disturbances  Complete by:  As directed    Call MD for:  persistant dizziness or light-headedness   Complete by:  As directed    Call MD for:  persistant nausea and vomiting   Complete by:  As directed    Call MD for:  severe uncontrolled pain   Complete by:  As directed    Call MD for:  temperature >100.4   Complete by:  As directed    Diet - low sodium heart healthy   Complete by:  As directed    Discharge instructions   Complete by:  As directed    1) take medications including Omnicef antibiotic as prescribed 2) you have stomach irritations/gastritis/erosions so Avoid ibuprofen/Advil/Aleve/Motrin/Goody Powders/Naproxen/BC  powders/Meloxicam/Diclofenac/Indomethacin and other Nonsteroidal anti-inflammatory medications as these will make you more likely to bleed and can cause stomach ulcers, can also cause Kidney problems.  3) please try make an appointment with a local primary care physician, who hopefully can refer you to [facility] gastroenterology department/hepatology department for further evaluation of your liver and biliary system 4) avoid excessive amount of Tylenol due to concerns about your liver   Increase activity slowly   Complete by:  As directed         Discharge Medications     Allergies as of 02/12/2018      Reactions   Aspirin Shortness Of Breath   Pt tolerates aspirin for occasional use.      Medication List    STOP taking these medications   aspirin EC 325 MG tablet   azelastine 0.1 % nasal spray Commonly known as:  ASTELIN   cetirizine 10 MG tablet Commonly known as:  ZYRTEC   fexofenadine-pseudoephedrine 60-120 MG 12 hr tablet Commonly known as:  ALLEGRA-D   fluticasone 220 MCG/ACT inhaler Commonly known as:  FLOVENT HFA     TAKE these medications   albuterol 108 (90 Base) MCG/ACT inhaler Commonly known as:  PROVENTIL HFA;VENTOLIN HFA Inhale 2 puffs into the lungs every 4 (four) hours as needed for wheezing or shortness of breath.   albuterol (2.5 MG/3ML) 0.083% nebulizer solution Commonly known as:  PROVENTIL Take 3 mLs (2.5 mg total) by nebulization every 4 (four) hours as needed for wheezing or shortness of breath.   cefdinir 300 MG capsule Commonly known as:  OMNICEF Take 1 capsule (300 mg total) by mouth 2 (two) times daily.   cetirizine-pseudoephedrine 5-120 MG tablet Commonly known as:  ZYRTEC-D Take 1 tablet by mouth See admin instructions. Take 1 tablet of either Zyrtec-D or Allegra-D by mouth twice daily   Fluticasone-Salmeterol 500-50 MCG/DOSE Aepb Commonly known as:  ADVAIR Inhale 1 puff into the lungs 2 (two) times daily.   hydrOXYzine 25 MG  tablet Commonly known as:  ATARAX/VISTARIL Take 1 tablet (25 mg total) by mouth every 6 (six) hours as needed for anxiety, itching or nausea (or for Sleep).   MAGNESIUM PO Take 1 tablet by mouth daily.   montelukast 10 MG tablet Commonly known as:  SINGULAIR Take 1 tablet (10 mg total) by mouth at bedtime.   pantoprazole 40 MG tablet Commonly known as:  PROTONIX Take 1 tablet (40 mg total) by mouth 2 (two) times daily. For 8 weeks, and then after that take all the once daily   sucralfate 1 g tablet Commonly known as:  CARAFATE Take 1 tablet (1 g total) by mouth 4 (four) times daily -  with meals and at bedtime for 14 days. For 2 weeks only   Vitamin D3  3000 units Tabs Take 3,000 Units by mouth daily.       Major procedures and Radiology Reports - PLEASE review detailed and final reports for all details, in brief -    Dg Chest 2 View  Result Date: 02/10/2018 CLINICAL DATA:  Fever for 2 days. EXAM: CHEST - 2 VIEW COMPARISON:  None. FINDINGS: The heart size and mediastinal contours are within normal limits. Mild patchy opacity of right lung base is identified. There is no pulmonary edema or pleural effusion. The visualized skeletal structures are unremarkable. IMPRESSION: Mild patchy opacity of right lung base, developing pneumonia is not excluded. Electronically Signed   By: Abelardo Diesel M.D.   On: 02/10/2018 11:53   Mr 3d Recon At Scanner  Result Date: 02/06/2018 CLINICAL DATA:  Abnormal liver function tests.  Abdominal pain. EXAM: MRI ABDOMEN WITHOUT AND WITH CONTRAST (INCLUDING MRCP) TECHNIQUE: Multiplanar multisequence MR imaging of the abdomen was performed both before and after the administration of intravenous contrast. Heavily T2-weighted images of the biliary and pancreatic ducts were obtained, and three-dimensional MRCP images were rendered by post processing. CONTRAST:  10 cc Gadavist COMPARISON:  02/05/2018 abdominal ultrasound. This demonstrated hyperechoic liver  lesions. FINDINGS: Portions of exam are mild to moderately motion degraded. Lower chest: Normal heart size without pericardial or pleural effusion. Hepatobiliary: Abnormal appearance of the liver, with central areas of tubular mixed T2 signal, highly suspicious for dilated intrahepatic ducts with ductal calculi. Example images 14/8, 13/4, 10/14, and 44/12. These are most conspicuous within the left hepatic lobe. Somewhat more masslike peripheral lateral segment left liver lobe T2 hyperintensity, including on image 15/4, measures 1.7 cm and is favored to represent focal intrahepatic duct dilatation. Similarly, T2 hypointensity at 1.8 cm in the right hepatic lobe on 16/8 is favored to be related to focal intrahepatic duct dilatation and intrahepatic ductal stones within. No gallstones or common duct dilatation. The common duct is upper normal in size for age at 7 mm. Pancreas:  Normal, without mass or ductal dilatation. Spleen: Nonenhancing anterior splenic lesion measures 8.0 x 6.1 cm on image 12/4. Peripheral T2 hypointensity with heterogeneous central T2 signal. No post-contrast enhancement. Smaller nonspecific T2 hyperintense posterior splenic lesions. No splenomegaly. Adrenals/Urinary Tract: Normal adrenal glands. Normal kidneys, without hydronephrosis. Stomach/Bowel: Grossly normal stomach and abdominal bowel loops. Vascular/Lymphatic: Normal caliber of the aorta and branch vessels. Prominent porta hepatis nodes are likely reactive, not pathologic by size criteria. Example 11 mm. Other:  No ascites. Musculoskeletal: No acute osseous abnormality. IMPRESSION: 1. Portions of exam are mild to moderately motion degraded. 2. Diffusely abnormal appearance of the liver, worse on the left. Favored to represent intrahepatic biliary duct dilatation with numerous ductal calculi. 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 Differential considerations include primary sclerosing  cholangitis, HIV cholangiopathy. Given the difficulty with visualization of intrahepatic calculi at MRI, and motion on the current exam, liver protocol CT may be informative. 3. Heterogeneous splenic lesion is favored to represent the sequelae of prior trauma (chronic hematoma) or infection. Recommend attention on follow-up. 4. Mildly prominent porta hepatis nodes, favored to be reactive. These results will be called to the ordering clinician or representative by the Radiologist Assistant, and communication documented in the PACS or zVision Dashboard. Electronically Signed   By: Abigail Miyamoto M.D.   On: 02/06/2018 14:03   Nm Hepato W/eject Fract  Result Date: 02/09/2018 CLINICAL DATA:  Acute abdominal pain, epigastric pain, elevated LFTs, abnormal appearance of liver by MR EXAM: NUCLEAR MEDICINE  HEPATOBILIARY IMAGING TECHNIQUE: Sequential images of the abdomen were obtained out to 60 minutes following intravenous administration of radiopharmaceutical. RADIOPHARMACEUTICALS:  5.4 mCi Tc-73m Choletec IV Pharmaceutical: Morphine Sulfate 4 mg IV after 90 minutes of imaging COMPARISON:  CT abdomen and pelvis 02/06/2018 FINDINGS: Prompt clearance of tracer from bloodstream. Delayed excretion of tracer into the biliary tree. Gallbladder faintly visualized at 75 minutes period. At 90 minutes, CBD and small bowel were not visualized. Patient received morphine and imaging was continued for 30 minutes. Gallbladder better visualizes with tracer following morphine augmentation. No small bowel visualization throughout the exam. Prolonged visualization of patent parenchyma throughout the study with overall poor excretion of tracer into the biliary tree compatible with hepatocellular dysfunction. IMPRESSION: Diffuse hepatocellular dysfunction with poor biliary excretion of tracer and prolonged hepatic parenchymal retention of tracer throughout exam. Patent cystic duct. Nonvisualization of tracer within the small bowel prior to  morphine augmentation, unable to establish CBD patency. Electronically Signed   By: MLavonia DanaM.D.   On: 02/09/2018 09:30   Mr Abdomen Mrcp WMoise BoringContast  Result Date: 02/06/2018 CLINICAL DATA:  Abnormal liver function tests.  Abdominal pain. EXAM: MRI ABDOMEN WITHOUT AND WITH CONTRAST (INCLUDING MRCP) TECHNIQUE: Multiplanar multisequence MR imaging of the abdomen was performed both before and after the administration of intravenous contrast. Heavily T2-weighted images of the biliary and pancreatic ducts were obtained, and three-dimensional MRCP images were rendered by post processing. CONTRAST:  10 cc Gadavist COMPARISON:  02/05/2018 abdominal ultrasound. This demonstrated hyperechoic liver lesions. FINDINGS: Portions of exam are mild to moderately motion degraded. Lower chest: Normal heart size without pericardial or pleural effusion. Hepatobiliary: Abnormal appearance of the liver, with central areas of tubular mixed T2 signal, highly suspicious for dilated intrahepatic ducts with ductal calculi. Example images 14/8, 13/4, 10/14, and 44/12. These are most conspicuous within the left hepatic lobe. Somewhat more masslike peripheral lateral segment left liver lobe T2 hyperintensity, including on image 15/4, measures 1.7 cm and is favored to represent focal intrahepatic duct dilatation. Similarly, T2 hypointensity at 1.8 cm in the right hepatic lobe on 16/8 is favored to be related to focal intrahepatic duct dilatation and intrahepatic ductal stones within. No gallstones or common duct dilatation. The common duct is upper normal in size for age at 7 mm. Pancreas:  Normal, without mass or ductal dilatation. Spleen: Nonenhancing anterior splenic lesion measures 8.0 x 6.1 cm on image 12/4. Peripheral T2 hypointensity with heterogeneous central T2 signal. No post-contrast enhancement. Smaller nonspecific T2 hyperintense posterior splenic lesions. No splenomegaly. Adrenals/Urinary Tract: Normal adrenal glands. Normal  kidneys, without hydronephrosis. Stomach/Bowel: Grossly normal stomach and abdominal bowel loops. Vascular/Lymphatic: Normal caliber of the aorta and branch vessels. Prominent porta hepatis nodes are likely reactive, not pathologic by size criteria. Example 11 mm. Other:  No ascites. Musculoskeletal: No acute osseous abnormality. IMPRESSION: 1. Portions of exam are mild to moderately motion degraded. 2. Diffusely abnormal appearance of the liver, worse on the left. Favored to represent intrahepatic biliary duct dilatation with numerous ductal calculi. 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 Differential considerations include primary sclerosing cholangitis, HIV cholangiopathy. Given the difficulty with visualization of intrahepatic calculi at MRI, and motion on the current exam, liver protocol CT may be informative. 3. Heterogeneous splenic lesion is favored to represent the sequelae of prior trauma (chronic hematoma) or infection. Recommend attention on follow-up. 4. Mildly prominent porta hepatis nodes, favored to be reactive. These results will be called to the ordering clinician or  representative by the Radiologist Assistant, and communication documented in the PACS or zVision Dashboard. Electronically Signed   By: Abigail Miyamoto M.D.   On: 02/06/2018 14:03   Ct Liver Abdomen W Wo Contrast  Result Date: 02/06/2018 CLINICAL DATA:  Abnormal liver enzymes in a cholestatic pattern EXAM: CT ABDOMEN AND PELVIS WITHOUT AND WITH CONTRAST TECHNIQUE: Multidetector CT imaging of the abdomen and pelvis was performed following the standard protocol before and following the bolus administration of intravenous contrast. CONTRAST:  181m ISOVUE-370 IOPAMIDOL (ISOVUE-370) INJECTION 76% COMPARISON:  02/06/2018 FINDINGS: Lower chest: Atelectasis in the lingula and right middle lobe. Airway thickening. Hepatobiliary: Again noted are irregular bile ducts especially in the left hepatic lobe  with some areas of cystic dilatation probably in continuity with the bile duct as on image 29/10. On MRI there were some low signal intensity filling defects in some of these areas suspicious for gallstones; these have relatively intermediate density on CT, and are not calcified or well appreciated does gallstones on CT, but the appearance was very compelling at for gallstones on the MRI. There are other large filling defects in the biliary tree on MRI that are less conspicuous on CT. I do not appreciate that the contents distending the biliary tree were enhancing on the subtraction images from the MRI, and accordingly intraductal tumor seems less likely. Gallbladder unremarkable. No appreciable portal vein thrombosis or patent vein thrombosis. No pneumobilia. Pancreas: Unremarkable Spleen: Large rim calcified splenic cystic lesion 8.7 by 7.0 cm, without appreciable internal enhancement on CT or on the subtraction images from prior MRI. Several additional small hypodense lesions are present in the spleen posterior to this dominant cystic lesion. Adrenals/Urinary Tract: Unremarkable Stomach/Bowel: Unremarkable Vascular/Lymphatic: Multiple small porta hepatis lymph nodes are present. Reproductive: An IUD is present in the uterus. 4.6 cm fibroid in the uterine fundus. Ovaries unremarkable. Other: 2.9 by 2.8 cm focus of omental fat necrosis in the left lower quadrant on image 80/10 is likely chronic and has rim calcification. Musculoskeletal: Unremarkable IMPRESSION: 1. Irregular bile ducts with numerous small internal filling defects shown on recent MRI. These filling defects are intermediate density but demonstrate heterogeneity against the background biliary fluid density especially involving the left intrahepatic biliary tree. There are dilated portions and irregular portions favoring cholangitis potentially in conjunction with extensive intrahepatic stone formation. Potentially debris could have a similar  appearance in the biliary tree. I do not see abnormal enhancement in the biliary tree to suggest biliary tumor on the recent MRI. Primary sclerosing cholangitis would be a top differential diagnostic consideration. 2. Large rim calcified splenic lesion, likely a postinflammatory or posttraumatic lesion. Smaller hypodense lesions along its posterior margin are likewise probably related to the initial insult. Electronically Signed   By: WVan ClinesM.D.   On: 02/06/2018 21:04   UKoreaAbdomen Limited Ruq  Result Date: 02/05/2018 CLINICAL DATA:  Right upper quadrant tenderness EXAM: ULTRASOUND ABDOMEN LIMITED RIGHT UPPER QUADRANT COMPARISON:  None. FINDINGS: Gallbladder: No gallstones, gallbladder wall thickening, or pericholecystic fluid. Negative sonographic Murphy's sign. Common bile duct: Diameter: 6 mm Liver: Hyperechoic hepatic parenchyma, suggesting hepatic steatosis. 2.0 x 2.0 x 1.9 cm hyperechoic lesion in the central right hepatic lobe (image 38). Additional 2.0 x 2.8 x 2.4 cm vague hyperechoic lesion in the right hepatic lobe. Portal vein is patent on color Doppler imaging with normal direction of blood flow towards the liver. IMPRESSION: Suspected hepatic steatosis. Two hyperechoic hepatic lesions, likely reflecting benign hemangiomas, although incompletely characterized. Consider follow-up MRI  abdomen with/without contrast in 3 months. Electronically Signed   By: Julian Hy M.D.   On: 02/05/2018 17:40    Micro Results    Recent Results (from the past 240 hour(s))  Culture, blood (Routine X 2) w Reflex to ID Panel     Status: Abnormal   Collection Time: 02/09/18  3:45 PM  Result Value Ref Range Status   Specimen Description BLOOD LEFT ANTECUBITAL  Final   Special Requests   Final    BOTTLES DRAWN AEROBIC ONLY Blood Culture adequate volume   Culture  Setup Time   Final    GRAM NEGATIVE RODS AEROBIC BOTTLE ONLY CRITICAL RESULT CALLED TO, READ BACK BY AND VERIFIED WITH: Sonia Baller PHARMD, AT 2409 02/10/18 BY D. VANHOOK    Culture ESCHERICHIA COLI (A)  Final   Report Status 02/12/2018 FINAL  Final   Organism ID, Bacteria ESCHERICHIA COLI  Final      Susceptibility   Escherichia coli - MIC*    AMPICILLIN >=32 RESISTANT Resistant     CEFAZOLIN 16 SENSITIVE Sensitive     CEFEPIME <=1 SENSITIVE Sensitive     CEFTAZIDIME <=1 SENSITIVE Sensitive     CEFTRIAXONE <=1 SENSITIVE Sensitive     CIPROFLOXACIN <=0.25 SENSITIVE Sensitive     GENTAMICIN <=1 SENSITIVE Sensitive     IMIPENEM <=0.25 SENSITIVE Sensitive     TRIMETH/SULFA <=20 SENSITIVE Sensitive     AMPICILLIN/SULBACTAM >=32 RESISTANT Resistant     PIP/TAZO <=4 SENSITIVE Sensitive     Extended ESBL NEGATIVE Sensitive     * ESCHERICHIA COLI  Blood Culture ID Panel (Reflexed)     Status: Abnormal   Collection Time: 02/09/18  3:45 PM  Result Value Ref Range Status   Enterococcus species NOT DETECTED NOT DETECTED Final   Vancomycin resistance NOT DETECTED NOT DETECTED Final   Listeria monocytogenes NOT DETECTED NOT DETECTED Final   Staphylococcus species NOT DETECTED NOT DETECTED Final   Staphylococcus aureus (BCID) NOT DETECTED NOT DETECTED Final   Methicillin resistance NOT DETECTED NOT DETECTED Final   Streptococcus species NOT DETECTED NOT DETECTED Final   Streptococcus agalactiae NOT DETECTED NOT DETECTED Final   Streptococcus pneumoniae NOT DETECTED NOT DETECTED Final   Streptococcus pyogenes NOT DETECTED NOT DETECTED Final   Acinetobacter baumannii NOT DETECTED NOT DETECTED Final   Enterobacteriaceae species DETECTED (A) NOT DETECTED Final    Comment: CRITICAL RESULT CALLED TO, READ BACK BY AND VERIFIED WITH: Dawson Bills PharmD 11:55 02/10/18 ([email or web address]) Enterobacteriaceae represent a large family of gram-negative bacteria, not a single organism.    Enterobacter cloacae complex NOT DETECTED NOT DETECTED Final   Escherichia coli DETECTED (A) NOT DETECTED Final    Comment: CRITICAL RESULT CALLED  TO, READ BACK BY AND VERIFIED WITH: Dawson Bills PharmD 11:55 02/10/18 ([email or web address])    Klebsiella oxytoca NOT DETECTED NOT DETECTED Final   Klebsiella pneumoniae NOT DETECTED NOT DETECTED Final   Proteus species NOT DETECTED NOT DETECTED Final   Serratia marcescens NOT DETECTED NOT DETECTED Final   Carbapenem resistance NOT DETECTED NOT DETECTED Final   Haemophilus influenzae NOT DETECTED NOT DETECTED Final   Neisseria meningitidis NOT DETECTED NOT DETECTED Final   Pseudomonas aeruginosa NOT DETECTED NOT DETECTED Final   Candida albicans NOT DETECTED NOT DETECTED Final   Candida glabrata NOT DETECTED NOT DETECTED Final   Candida krusei NOT DETECTED NOT DETECTED Final   Candida parapsilosis NOT DETECTED NOT DETECTED Final   Candida tropicalis NOT DETECTED NOT DETECTED Final  Comment: Performed at [facility], [place] [address] [place], [place] [address]  Culture, Urine     Status: Abnormal (Preliminary result)   Collection Time: 02/10/18  1:58 AM  Result Value Ref Range Status   Specimen Description URINE, CLEAN CATCH  Final   Special Requests NONE  Final   Culture (A)  Final    >=100,000 COLONIES/mL ESCHERICHIA COLI SUSCEPTIBILITIES TO FOLLOW Performed at [facility], [address], [place], [place] [address]    Report Status PENDING  Incomplete  Urine Culture     Status: Abnormal   Collection Time: 02/10/18 12:46 PM  Result Value Ref Range Status   Specimen Description URINE, CLEAN CATCH  Final   Special Requests   Final    Normal Performed at [facility], [place] [address], [place], [place] [address]    Culture 80,000 COLONIES/mL ESCHERICHIA COLI (A)  Final   Report Status 02/12/2018 FINAL  Final   Organism ID, Bacteria ESCHERICHIA COLI (A)  Final      Susceptibility   Escherichia coli - MIC*    AMPICILLIN >=32 RESISTANT Resistant     CEFAZOLIN 16 SENSITIVE Sensitive     CEFTRIAXONE <=1 SENSITIVE Sensitive     CIPROFLOXACIN <=0.25 SENSITIVE Sensitive      GENTAMICIN <=1 SENSITIVE Sensitive     IMIPENEM <=0.25 SENSITIVE Sensitive     NITROFURANTOIN <=16 SENSITIVE Sensitive     TRIMETH/SULFA <=20 SENSITIVE Sensitive     AMPICILLIN/SULBACTAM >=32 RESISTANT Resistant     PIP/TAZO <=4 SENSITIVE Sensitive     Extended ESBL NEGATIVE Sensitive     * 80,000 COLONIES/mL ESCHERICHIA COLI       Today   Subjective    [name] today has no complaints, no further fevers, no chills, no Rigors, no night sweats, eating and drinking okay          Patient has been seen and examined prior to discharge   Objective   Blood pressure 98/75, pulse 87, temperature 98.6 F (37 C), temperature source Oral, resp. rate 18, height _0  (1.676 m), weight 119.2 kg, SpO2 97 %.   Intake/Output Summary (Last 24 hours) at 02/12/2018 1340 Last data filed at 02/12/2018 1100 Gross per 24 hour  Intake 890 ml  Output -  Net 890 ml    Exam Patient is examined daily including today on 02/12/18 , exams remain the same as of yesterday except that has changed   Gen:- Awake Alert,  In no apparent distress  HEENT:- [place].AT, No sclera icterus Neck-Supple Neck,No JVD,.  Lungs-mostly clear, and movement is fair and symmetrical  CV- S1, S2 normal, regular Abd-  +ve B.Sounds, Abd Soft, no significant abdominal tenderness, no CVA tenderness,  Extremity/Skin:- No  edema,   good pulses Psych-affect is appropriate, oriented x3 Neuro-no new focal deficits, no tremors   Data Review   CBC w Diff:  Lab Results  Component Value Date   WBC 8.4 02/12/2018   HGB 12.5 02/12/2018   HGB 13.3 05/13/2017   HCT 37.5 02/12/2018   HCT 40.8 05/13/2017   PLT 259 02/12/2018   PLT 304 05/13/2017   LYMPHOPCT 16 02/07/2018   MONOPCT 6 02/07/2018   EOSPCT 5 02/07/2018   BASOPCT 1 02/07/2018    CMP:  Lab Results  Component Value Date   NA 136 02/12/2018   K 3.5 02/12/2018   CL 103 02/12/2018   CO2 22 02/12/2018   BUN 7 02/12/2018   CREATININE  0.77 02/12/2018   PROT  7.4 02/12/2018   ALBUMIN 2.6 (L) 02/12/2018   BILITOT 2.2 (H) 02/12/2018   ALKPHOS 504 (H) 02/12/2018   AST 82 (H) 02/12/2018   ALT 91 (H) 02/12/2018  .   Total Discharge time is about 33 minutes  Roxan Hockey M.D on 02/12/2018 at 1:40 PM  Pager---[phone]  Go to [email or web address] - password [email or web address] for contact info  [facility] - Office  [phone]

## 2018-02-12 NOTE — Discharge Instructions (Signed)
1)Take medications including Omnicef antibiotic as prescribed 2)You have stomach irritations/gastritis/erosions so Avoid ibuprofen/Advil/Aleve/Motrin/Goody Powders/Naproxen/BC powders/Meloxicam/Diclofenac/Indomethacin and other Nonsteroidal anti-inflammatory medications as these will make you more likely to bleed and can cause stomach ulcers, can also cause Kidney problems.  3)Please try make an appointment with a local primary care physician, who hopefully can refer you to [facility] gastroenterology department/hepatology department for further evaluation of your liver and biliary system 4)Avoid excessive amount of Tylenol due to concerns about your liver

## 2018-02-13 LAB — ANTI-PARIETAL ANTIBODY: Parietal Cell Antibody-IgG: 13.8 Units (ref 0.0–20.0)

## 2018-02-14 LAB — URINE CULTURE: Culture: >=100000 — AB

## 2018-02-15 ENCOUNTER — Telehealth: Payer: Self-pay

## 2018-02-15 NOTE — Telephone Encounter (Signed)
Faxed referral information to [facility] hepatology at [phone].

## 2018-02-15 NOTE — Telephone Encounter (Signed)
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 Outpatient appointment with any provider is okay. Please include recent progress notes from her hospitalization, ultrasound, CT, MRI, and lab results with the referral documents. Thank you.

## 2018-02-17 NOTE — ED Provider Notes (Signed)
  ED ECG REPORT   Date: 02/17/2018  Rate: 81  Rhythm: normal sinus rhythm  QRS Axis: normal  Intervals: normal  ST/T Wave abnormalities: normal  Conduction Disutrbances:none  Narrative Interpretation:   Old EKG Reviewed: none available  I have personally reviewed the EKG tracing and agree with the computerized printout as noted.   Lorayne Bender, PA-C 02/17/18 8250    Valarie Merino, MD 02/21/18 [phone]

## 2018-03-23 ENCOUNTER — Telehealth: Payer: Self-pay

## 2018-03-23 NOTE — Telephone Encounter (Signed)
Patient is scheduled to see Dr. Waynetta Pean at [facility] on 04/21/18.

## 2018-04-21 DIAGNOSIS — K831 Obstruction of bile duct: Secondary | ICD-10-CM | POA: Insufficient documentation

## 2018-05-01 ENCOUNTER — Encounter (HOSPITAL_COMMUNITY): Payer: Self-pay

## 2018-05-01 ENCOUNTER — Other Ambulatory Visit: Payer: Self-pay

## 2018-05-01 ENCOUNTER — Emergency Department (HOSPITAL_COMMUNITY)
Admission: EM | Admit: 2018-05-01 | Discharge: 2018-05-01 | Disposition: A | Discharge status: Home / Self Care | Payer: 59 | Attending: Emergency Medicine | Admitting: Emergency Medicine

## 2018-05-01 ENCOUNTER — Emergency Department (HOSPITAL_COMMUNITY): Payer: 59

## 2018-05-01 DIAGNOSIS — J45909 Unspecified asthma, uncomplicated: Secondary | ICD-10-CM | POA: Insufficient documentation

## 2018-05-01 DIAGNOSIS — Z79899 Other long term (current) drug therapy: Secondary | ICD-10-CM | POA: Insufficient documentation

## 2018-05-01 DIAGNOSIS — J45901 Unspecified asthma with (acute) exacerbation: Secondary | ICD-10-CM

## 2018-05-01 DIAGNOSIS — R05 Cough: Secondary | ICD-10-CM | POA: Diagnosis present

## 2018-05-01 MED ORDER — ALBUTEROL SULFATE HFA 108 (90 BASE) MCG/ACT IN AERS
2.0000 | INHALATION_SPRAY | Freq: Once | RESPIRATORY_TRACT | Status: AC
  Administered 2018-05-01: 2 via RESPIRATORY_TRACT
  Filled 2018-05-01: qty 6.7

## 2018-05-01 MED ORDER — PREDNISONE 20 MG PO TABS: 60.0000 mg | ORAL_TABLET | Freq: Every day | ORAL | 0 refills | Status: AC

## 2018-05-01 MED ORDER — ALBUTEROL SULFATE (2.5 MG/3ML) 0.083% IN NEBU: 2.5000 mg | INHALATION_SOLUTION | RESPIRATORY_TRACT | 1 refills | Status: DC | PRN

## 2018-05-01 MED ORDER — FLUTICASONE-SALMETEROL 500-50 MCG/DOSE IN AEPB: 1.0000 | INHALATION_SPRAY | Freq: Two times a day (BID) | RESPIRATORY_TRACT | 5 refills | Status: DC

## 2018-05-01 MED ORDER — IPRATROPIUM-ALBUTEROL 0.5-2.5 (3) MG/3ML IN SOLN
3.0000 mL | Freq: Once | RESPIRATORY_TRACT | Status: AC
  Administered 2018-05-01: 3 mL via RESPIRATORY_TRACT
  Filled 2018-05-01: qty 3

## 2018-05-01 MED ORDER — PREDNISONE 20 MG PO TABS
60.0000 mg | ORAL_TABLET | Freq: Once | ORAL | Status: AC
  Administered 2018-05-01: 60 mg via ORAL
  Filled 2018-05-01: qty 3

## 2018-05-01 NOTE — ED Provider Notes (Signed)
[facility] EMERGENCY DEPARTMENT Provider Note   CSN: [id number] Arrival date & time: 05/01/18  1504     History   Chief Complaint Chief Complaint  Patient presents with  . Cough  . Asthma    HPI Monique Harrison is a 42 y.o. female.  The history is provided by the patient.  URI   This is a new problem. The current episode started 2 days ago. The problem has not changed since onset.There has been no fever. Associated symptoms include sinus pain, cough and wheezing. Pertinent negatives include no chest pain, no abdominal pain, no nausea, no vomiting, no dysuria, no congestion, no ear pain, no sneezing, no sore throat and no rash. She has tried an inhaler for the symptoms. The treatment provided mild relief.    Past Medical History:  Diagnosis Date  . Abnormal LFTs 2005  . Asthma   . Recurrent upper respiratory infection (URI)   . Upper GI bleed 2016   [place], [place]; presented with melena; admitted for 1 week; thought to be due to frequent Naproxen use  . Vitamin D deficiency     Patient Active Problem List   Diagnosis Date Noted  . E coli bacteremia 02/10/2018  . Rt Sided CAP (community acquired pneumonia) 02/10/2018  . Cyst of spleen   . Right upper quadrant abdominal tenderness without rebound tenderness   . Elevated LFTs   . Hemorrhagic gastritis 02/08/2018  . Hypotension 02/07/2018  . Gastritis with hemorrhage 02/07/2018  . 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 Abdominal pain 02/05/2018  . Asthma 03/31/2017    Past Surgical History:  Procedure Laterality Date  . BIOPSY  02/07/2018   Procedure: BIOPSY;  Surgeon: Thornton Park, MD;  Location: [facility] ENDOSCOPY;  Service: Gastroenterology;;  . ESOPHAGOGASTRODUODENOSCOPY (EGD) WITH PROPOFOL N/A 02/07/2018   Procedure: ESOPHAGOGASTRODUODENOSCOPY (EGD) WITH PROPOFOL;  Surgeon: Thornton Park, MD;  Location: [facility];  Service: Gastroenterology;  Laterality: N/A;  . NO PAST SURGERIES        OB History   No obstetric history on file.      Home Medications    Prior to Admission medications   Medication Sig Start Date End Date Taking? Authorizing Provider  albuterol (PROVENTIL) (2.5 MG/3ML) 0.083% nebulizer solution Take 3 mLs (2.5 mg total) by nebulization every 4 (four) hours as needed for wheezing or shortness of breath. 05/01/18   Kaydee Magel, DO  cefdinir (OMNICEF) 300 MG capsule Take 1 capsule (300 mg total) by mouth 2 (two) times daily. 02/12/18   Roxan Hockey, MD  cetirizine-pseudoephedrine (ZYRTEC-D) 5-120 MG tablet Take 1 tablet by mouth See admin instructions. Take 1 tablet of either Zyrtec-D or Allegra-D by mouth twice daily    [provider]  Cholecalciferol (VITAMIN D3) 3000 units TABS Take 3,000 Units by mouth daily.    [provider]  Fluticasone-Salmeterol (ADVAIR) 500-50 MCG/DOSE AEPB Inhale 1 puff into the lungs 2 (two) times daily. 05/01/18   Tranise Forrest, DO  hydrOXYzine (ATARAX/VISTARIL) 25 MG tablet Take 1 tablet (25 mg total) by mouth every 6 (six) hours as needed for anxiety, itching or nausea (or for Sleep). 02/12/18   Roxan Hockey, MD  MAGNESIUM PO Take 1 tablet by mouth daily.    [provider]  montelukast (SINGULAIR) 10 MG tablet Take 1 tablet (10 mg total) by mouth at bedtime. 02/12/18   Roxan Hockey, MD  pantoprazole (PROTONIX) 40 MG tablet Take 1 tablet (40 mg total) by mouth 2 (two) times daily. For 8  weeks, and then after that take all the once daily 02/12/18   Roxan Hockey, MD  predniSONE (DELTASONE) 20 MG tablet Take 3 tablets (60 mg total) by mouth daily for 4 days. 05/01/18 05/05/18  Winna Golla, DO  sucralfate (CARAFATE) 1 g tablet Take 1 tablet (1 g total) by mouth 4 (four) times daily -  with meals and at bedtime for 14 days. For 2 weeks only 02/12/18 02/26/18  Roxan Hockey, MD    Family History Family History  Problem Relation Age of Onset  . Allergic rhinitis Father   .  Breast cancer Maternal Grandmother     Social History Social History   Tobacco Use  . Smoking status: Never Smoker  . Smokeless tobacco: Never Used  Substance Use Topics  . Alcohol use: No  . Drug use: No     Allergies   Aspirin   Review of Systems Review of Systems  Constitutional: Negative for chills and fever.  HENT: Positive for sinus pain. Negative for congestion, ear pain, sneezing and sore throat.   Eyes: Negative for pain and visual disturbance.  Respiratory: Positive for cough and wheezing. Negative for shortness of breath.   Cardiovascular: Negative for chest pain and palpitations.  Gastrointestinal: Negative for abdominal pain, nausea and vomiting.  Genitourinary: Negative for dysuria and hematuria.  Musculoskeletal: Negative for arthralgias and back pain.  Skin: Negative for color change and rash.  Neurological: Negative for seizures and syncope.  All other systems reviewed and are negative.    Physical Exam Updated Vital Signs  ED Triage Vitals  Enc Vitals Group     BP 05/01/18 1528 102/74     Pulse Rate 05/01/18 1528 86     Resp 05/01/18 1528 20     Temp 05/01/18 1528 98.7 F (37.1 C)     Temp Source 05/01/18 1528 Oral     SpO2 05/01/18 1528 97 %     Weight --      Height --      Head Circumference --      Peak Flow --      Pain Score 05/01/18 1559 4     Pain Loc --      Pain Edu? --      Excl. in [place]? --     Physical Exam Vitals signs and nursing note reviewed.  Constitutional:      General: She is not in acute distress.    Appearance: She is well-developed.  HENT:     Head: Normocephalic and atraumatic.     Nose: Nose normal. No congestion.     Mouth/Throat:     Mouth: Mucous membranes are moist.  Eyes:     Conjunctiva/sclera: Conjunctivae normal.     Pupils: Pupils are equal, round, and reactive to light.  Neck:     Musculoskeletal: Neck supple.  Cardiovascular:     Rate and Rhythm: Normal rate and regular rhythm.     Pulses:  Normal pulses.     Heart sounds: Normal heart sounds. No murmur.  Pulmonary:     Effort: Pulmonary effort is normal. No respiratory distress.     Breath sounds: Wheezing present.  Abdominal:     General: There is no distension.     Palpations: Abdomen is soft.     Tenderness: There is no abdominal tenderness.  Musculoskeletal:     Right lower leg: No edema.     Left lower leg: No edema.  Skin:    General: Skin is warm  and dry.  Neurological:     Mental Status: She is alert.      ED Treatments / Results  Labs (all labs ordered are listed, but only abnormal results are displayed) Labs Reviewed - No data to display  EKG None  Radiology Dg Chest 2 View  Result Date: 05/01/2018 CLINICAL DATA:  Cough for the past 4 days. History of asthma. Head congestion. EXAM: CHEST - 2 VIEW COMPARISON:  PA and lateral chest x-ray of February 10, 2018 FINDINGS: The lungs are borderline hypoinflated but clear. The heart and pulmonary vascularity are normal. There is no pleural effusion. The mediastinum is normal in width. The bony thorax is unremarkable. IMPRESSION: There is no pneumonia nor other acute cardiopulmonary abnormality. Electronically Signed   By: David  Martinique M.D.   On: 05/01/2018 16:30    Procedures Procedures (including critical care time)  Medications Ordered in ED Medications  ipratropium-albuterol (DUONEB) 0.5-2.5 (3) MG/3ML nebulizer solution 3 mL (3 mLs Nebulization Given 05/01/18 1733)  predniSONE (DELTASONE) tablet 60 mg (60 mg Oral Given 05/01/18 1733)  albuterol (PROVENTIL HFA;VENTOLIN HFA) 108 (90 Base) MCG/ACT inhaler 2 puff (2 puffs Inhalation Given 05/01/18 1733)     Initial Impression / Assessment and Plan / ED Course  I have reviewed the triage vital signs and the nursing notes.  Pertinent labs & imaging results that were available during my care of the patient were reviewed by me and considered in my medical decision making (see chart for details).      Monique Harrison is a 42 year old female history of asthma who presents to the ED with upper respiratory symptoms, wheezing.  Patient with normal vitals.  No fever.  Patient with cold-like symptoms for the last several days.  States that she ran out of her albuterol nebulizer.  Has had wheezing.  Feels as if she has an asthma exacerbation.  She has had a cough but no sputum production.  No pain with urination.  No signs of ear or throat infection.  Has some mild wheezing on exam.  No signs of respiratory distress.  No chest pain, no shortness of breath.  Doubt ACS or PE.  Likely viral process.  Chest x-ray showed no signs of pneumonia, pneumothorax, pleural effusion.  Patient was given DuoNeb, prednisone while in the ED with improvement.  Patient was given albuterol inhaler to go home with.  Given prescription for prednisone, nebulizer solution for home use.  Given return precautions and discharged from ED in good condition.  This chart was dictated using voice recognition software.  Despite best efforts to proofread,  errors can occur which can change the documentation meaning.   Final Clinical Impressions(s) / ED Diagnoses   Final diagnoses:  Mild asthma with exacerbation, unspecified whether persistent    ED Discharge Orders         Ordered    predniSONE (DELTASONE) 20 MG tablet  Daily     05/01/18 1844    albuterol (PROVENTIL) (2.5 MG/3ML) 0.083% nebulizer solution  Every 4 hours PRN     05/01/18 1844    Fluticasone-Salmeterol (ADVAIR) 500-50 MCG/DOSE AEPB  2 times daily     05/01/18 [place], [place], DO 05/01/18 1854

## 2018-05-01 NOTE — ED Triage Notes (Signed)
Pt here with generalized body aches, cough, and asthma since Dec 26th.  Was taking Advair and Zyterc for the symptoms but now has run out.  Lungs diminished in the bases. A&Ox4.

## 2018-05-01 NOTE — ED Notes (Signed)
Pt endorses congestion x 4 days and it is aggravating her asthma. Breath sounds clear. Pt taking zyrtek and theraflu cough syrup without relief. VSS

## 2018-05-01 NOTE — ED Notes (Signed)
Patient verbalizes understanding of discharge instructions. Opportunity for questioning and answers were provided. Armband removed by staff, pt discharged from ED.  

## 2018-07-14 ENCOUNTER — Other Ambulatory Visit: Payer: Self-pay | Admitting: *Deleted

## 2018-09-22 ENCOUNTER — Encounter: Payer: Self-pay | Admitting: Allergy

## 2018-09-22 ENCOUNTER — Ambulatory Visit: Payer: 59 | Admitting: Allergy

## 2018-09-22 ENCOUNTER — Other Ambulatory Visit: Payer: Self-pay

## 2018-09-22 VITALS — BP 102/88 | HR 87 | Temp 96.5°F | Resp 16 | Ht 65.75 in | Wt 278.0 lb

## 2018-09-22 DIAGNOSIS — H1010 Acute atopic conjunctivitis, unspecified eye: Secondary | ICD-10-CM

## 2018-09-22 DIAGNOSIS — J309 Allergic rhinitis, unspecified: Secondary | ICD-10-CM | POA: Diagnosis not present

## 2018-09-22 DIAGNOSIS — J4551 Severe persistent asthma with (acute) exacerbation: Secondary | ICD-10-CM | POA: Diagnosis not present

## 2018-09-22 DIAGNOSIS — J0100 Acute maxillary sinusitis, unspecified: Secondary | ICD-10-CM | POA: Diagnosis not present

## 2018-09-22 MED ORDER — AMOXICILLIN-POT CLAVULANATE 875-125 MG PO TABS: 1.0000 | ORAL_TABLET | Freq: Two times a day (BID) | ORAL | 0 refills | Status: AC

## 2018-09-22 MED ORDER — ALBUTEROL SULFATE HFA 108 (90 BASE) MCG/ACT IN AERS: 2.0000 | INHALATION_SPRAY | RESPIRATORY_TRACT | 1 refills | Status: DC | PRN

## 2018-09-22 MED ORDER — ALBUTEROL SULFATE (2.5 MG/3ML) 0.083% IN NEBU: 2.5000 mg | INHALATION_SOLUTION | RESPIRATORY_TRACT | 1 refills | Status: DC | PRN

## 2018-09-22 MED ORDER — FLUTICASONE-SALMETEROL 500-50 MCG/DOSE IN AEPB: 1.0000 | INHALATION_SPRAY | Freq: Two times a day (BID) | RESPIRATORY_TRACT | 5 refills | Status: DC

## 2018-09-22 NOTE — Patient Instructions (Addendum)
Sinus infection   - incomplete treatment with course of Amoxicillin  - start Augmentin 875mg  twice a day x 10 days  - take prednisone course as directed for next 5 days  - recommend using Xhance nasal spray device which helps the medicated spray to reach deeper into the sinuses for better efficacy.  Xhance contains fluticasone and is taken 2 sprays each nostril twice a day.  Demonstration video shown today.  We will start approval process with the [facility] program which would provide free medication for 6 months along with answering symptom improvement questions via phone.    - recommend use of nasal saline rinses daily to help flush/clean out the nasal cavity/sinuses.  Use distilled water or boil water and bring to room temp prior to use.  Do not use tap water.    Asthma     - with exacerbation secondary to sinus infection     - continue Advair diskus 500/50 1 puff twice a day     - have access to albuterol inhaler 2 puffs or nebulizer 1 vial every 4-6 hours as needed for cough/wheeze/shortness of breath/chest tightness.  May use 15-20 minutes prior to activity.   Monitor frequency of use.       - if we need to step-up therapy then would recommend Dupixent injections at this time which helps to block the allergy pathway and is indicated for asthma control improvement as well as chronic rhinosinusitis.    Asthma control goals:   Full participation in all desired activities (may need albuterol before activity)  Albuterol use two time or less a week on average (not counting use with activity)  Cough interfering with sleep two time or less a month  Oral steroids no more than once a year  No hospitalizations  Allergic rhinoconjunctivitis    - continue avoidance measures for dust mites, cat, dog, grasses, trees, weeds    - continue Allegra 180mg  daily.    - start Xhance as above      - saline rinses as above   Follow-up 3-4 months or sooner if needed

## 2018-09-22 NOTE — Progress Notes (Signed)
Follow-up Note  RE: Monique Harrison MRN: [id number] DOB: 06-24-1975 Date of Office Visit: 09/22/2018   History of present illness: Monique Harrison is a 43 y.o. female presenting today for sick visit for sinus issues. She has history of asthma and allergies.  She was last seen in the office on 08/29/17.  Since her last visit she was diagnosed with bile duct obstruction and is following with GI and has required MRCP.  She is on ursodiol, cholecalciferol and protonix.   She states she has been having sinus issues for a while.  She developed a sinus infection about 2 weeks ago that she took amoxicillin for 7 days.  She reports symptoms of sinus pain and pressure in her forehead and cheeks.  Nasal congestion with green drainage.  She feels the amoxicillin helped a little bit but she still reports having sinus pain in her forehead and having green thick discharge.  Denies fevers.  She is not using an nasal sprays or sinus rinses at this time.  She is taking Allegra-D.  2 days ago she states her asthma started acting up and she feels it is related to her sinuses and pollen exposure.  She developed chest tightness and cough.  She reports having nighttime awakenings.   She has used albuterol about 1-2 times a day since symptoms started.  She continues on Advair diskus 1 puff twice a day.  She stopped taking singulair "long time ago" as did not feel it was effective.  She also is no longer on Fasenra.  She did received 3 injections of Fasenra before stopping.  She does not feel it provided any better control of asthma symptoms.   She did have an ED visit on 05/01/18 for cough and wheezing.  CXR without evidence of consolidation/effusion. She was treated with duoneb and prendisone.    Review of systems: Review of Systems  Constitutional: Positive for malaise/fatigue. Negative for chills and fever.  HENT: Positive for congestion and sinus pain.   Eyes: Negative for pain and redness.  Respiratory: Positive  for cough and wheezing.   Cardiovascular: Negative for chest pain.  Gastrointestinal: Negative for abdominal pain, constipation, diarrhea, heartburn, nausea and vomiting.  Musculoskeletal: Negative for joint pain.  Skin: Negative for itching and rash.  Neurological: Negative for headaches.    All other systems negative unless noted above in HPI  Past medical/social/surgical/family history have been reviewed and are unchanged unless specifically indicated below.  No changes  Medication List: Allergies as of 09/22/2018      Reactions   Aspirin Shortness Of Breath   Pt tolerates aspirin for occasional use.      Medication List       Accurate as of Sep 22, 2018  4:03 PM. If you have any questions, ask your nurse or doctor.        STOP taking these medications   cefdinir 300 MG capsule Commonly known as:  OMNICEF Stopped by:  Shaylar Charmian Muff, MD   cetirizine-pseudoephedrine 5-120 MG tablet Commonly known as:  ZYRTEC-D Stopped by:  Shaylar Charmian Muff, MD   hydrOXYzine 25 MG tablet Commonly known as:  ATARAX/VISTARIL Stopped by:  Shaylar Charmian Muff, MD   MAGNESIUM PO Stopped by:  Shaylar Charmian Muff, MD   sucralfate 1 g tablet Commonly known as:  Carafate Stopped by:  Shaylar Charmian Muff, MD     TAKE these medications   albuterol (2.5 MG/3ML) 0.083% nebulizer solution Commonly known as:  PROVENTIL Take 3 mLs (2.5  mg total) by nebulization every 4 (four) hours as needed for wheezing or shortness of breath. What changed:  Another medication with the same name was added. Make sure you understand how and when to take each. Changed by:  Shaylar Charmian Muff, MD   albuterol 108 (90 Base) MCG/ACT inhaler Commonly known as:  VENTOLIN HFA Inhale 2 puffs into the lungs every 4 (four) hours as needed. What changed:  You were already taking a medication with the same name, and this prescription was added. Make sure you understand how and when  to take each. Changed by:  Shaylar Charmian Muff, MD   albuterol (2.5 MG/3ML) 0.083% nebulizer solution Commonly known as:  PROVENTIL Take 3 mLs (2.5 mg total) by nebulization every 4 (four) hours as needed for wheezing or shortness of breath. What changed:  You were already taking a medication with the same name, and this prescription was added. Make sure you understand how and when to take each. Changed by:  Shaylar Charmian Muff, MD   ALLEGRA-D PO Take by mouth.   amoxicillin-clavulanate 875-125 MG tablet Commonly known as:  AUGMENTIN Take 1 tablet by mouth 2 (two) times daily for 10 days. Started by:  Shaylar Charmian Muff, MD   Fluticasone-Salmeterol 500-50 MCG/DOSE Aepb Commonly known as:  ADVAIR Inhale 1 puff into the lungs 2 (two) times daily. What changed:  Another medication with the same name was added. Make sure you understand how and when to take each. Changed by:  Shaylar Charmian Muff, MD   Fluticasone-Salmeterol 500-50 MCG/DOSE Aepb Commonly known as:  Advair Diskus Inhale 1 puff into the lungs 2 (two) times daily. What changed:  You were already taking a medication with the same name, and this prescription was added. Make sure you understand how and when to take each. Changed by:  Shaylar Charmian Muff, MD   montelukast 10 MG tablet Commonly known as:  SINGULAIR Take 1 tablet (10 mg total) by mouth at bedtime.   pantoprazole 40 MG tablet Commonly known as:  PROTONIX Take 1 tablet (40 mg total) by mouth 2 (two) times daily. For 8 weeks, and then after that take all the once daily   ursodiol 300 MG capsule Commonly known as:  ACTIGALL TAKE 2 CAPSULES (600 MG TOTAL) BY MOUTH 2 (TWO) TIMES DAILY   Vitamin D3 75 MCG (3000 UT) Tabs Take 3,000 Units by mouth daily.       Known medication allergies: Allergies  Allergen Reactions  . Aspirin Shortness Of Breath    Pt tolerates aspirin for occasional use.     Physical examination: Blood  pressure 102/88, pulse 87, temperature (!) 96.5 F (35.8 C), temperature source Temporal, resp. rate 16, height 5' 5.75" (1.67 m), weight 278 lb (126.1 kg), SpO2 96 %.  General: Alert, interactive, in no acute distress. HEENT: PERRLA, TMs pearly gray, turbinates markedly edematous with thick discharge, post-pharynx non erythematous. Neck: Supple without lymphadenopathy. Lungs: Mildly decreased breath sounds bilaterally without wheezing, rhonchi or rales. {no increased work of breathing. CV: Normal S1, S2 without murmurs. Abdomen: Nondistended, nontender. Skin: Warm and dry, without lesions or rashes. Extremities:  No clubbing, cyanosis or edema. Neuro:   Grossly intact.  Diagnositics/Labs: None today  Assessment and plan: Acute maxillary sinusitis  - incomplete treatment with course of Amoxicillin  - start Augmentin 875mg  twice a day x 10 days  - take prednisone course as directed for next 5 days  - recommend using Xhance nasal spray device which helps the medicated  spray to reach deeper into the sinuses for better efficacy.  Xhance contains fluticasone and is taken 2 sprays each nostril twice a day.  Demonstration video shown today.  We will start approval process with the [facility] program which would provide free medication for 6 months along with answering symptom improvement questions via phone.    - recommend use of nasal saline rinses daily to help flush/clean out the nasal cavity/sinuses.  Use distilled water or boil water and bring to room temp prior to use.  Do not use tap water.    Asthma, severe persistent     - with exacerbation secondary to sinus infection     - continue Advair diskus 500/50 1 puff twice a day     - have access to albuterol inhaler 2 puffs or nebulizer 1 vial every 4-6 hours as needed for cough/wheeze/shortness of breath/chest tightness.  May use 15-20 minutes prior to activity.   Monitor frequency of use.       - if we need to step-up therapy then would  recommend Dupixent injections at this time which helps to block the allergy pathway and is indicated for asthma control improvement as well as chronic rhinosinusitis.    Asthma control goals:   Full participation in all desired activities (may need albuterol before activity)  Albuterol use two time or less a week on average (not counting use with activity)  Cough interfering with sleep two time or less a month  Oral steroids no more than once a year  No hospitalizations  Allergic rhinoconjunctivitis    - continue avoidance measures for dust mites, cat, dog, grasses, trees, weeds    - continue Allegra 180mg  daily.    - start Xhance as above      - saline rinses as above  Follow-up 3-4 months or sooner if needed  I appreciate the opportunity to take part in [facility] care. Please do not hesitate to contact me with questions.  Sincerely,   Prudy Feeler, MD Allergy/Immunology [facility] [place]

## 2018-10-12 MED ORDER — XHANCE 93 MCG/ACT NA EXHU: 2.0000 | INHALANT_SUSPENSION | Freq: Two times a day (BID) | NASAL | 5 refills | Status: DC

## 2018-11-09 ENCOUNTER — Ambulatory Visit: Payer: 59 | Admitting: Internal Medicine

## 2018-11-20 ENCOUNTER — Ambulatory Visit: Payer: 59 | Admitting: Nurse Practitioner

## 2018-11-30 ENCOUNTER — Ambulatory Visit: Payer: 59 | Admitting: Internal Medicine

## 2018-12-25 ENCOUNTER — Ambulatory Visit: Payer: 59 | Admitting: Nurse Practitioner

## 2019-01-25 ENCOUNTER — Ambulatory Visit (INDEPENDENT_AMBULATORY_CARE_PROVIDER_SITE_OTHER): Payer: 59 | Admitting: Allergy

## 2019-01-25 ENCOUNTER — Other Ambulatory Visit: Payer: Self-pay

## 2019-01-25 ENCOUNTER — Encounter: Payer: Self-pay | Admitting: Allergy

## 2019-01-25 VITALS — BP 122/82 | HR 95 | Temp 97.7°F | Resp 16 | Ht 66.0 in | Wt 284.0 lb

## 2019-01-25 DIAGNOSIS — H1013 Acute atopic conjunctivitis, bilateral: Secondary | ICD-10-CM | POA: Diagnosis not present

## 2019-01-25 DIAGNOSIS — J4550 Severe persistent asthma, uncomplicated: Secondary | ICD-10-CM

## 2019-01-25 DIAGNOSIS — J3089 Other allergic rhinitis: Secondary | ICD-10-CM | POA: Diagnosis not present

## 2019-01-25 MED ORDER — TRELEGY ELLIPTA 100-62.5-25 MCG/INH IN AEPB: 1.0000 | INHALATION_SPRAY | Freq: Every day | RESPIRATORY_TRACT | 0 refills | Status: DC

## 2019-01-25 MED ORDER — BUDESONIDE 0.5 MG/2ML IN SUSP: RESPIRATORY_TRACT | 5 refills | Status: DC

## 2019-01-25 NOTE — Patient Instructions (Addendum)
Asthma     - will have you trial Trelegy (triple medication therapy) 1 puff once a day.  If this works better than Advair for you let us know and will refill.   Hold Advair while on Trelegy trial     - if Trelegy is not more effective than Advair then can resume your regular dose Advair diskus 500/50 1 puff twice a day     - have access to albuterol inhaler 2 puffs or nebulizer 1 vial every 4-6 hours as needed for cough/wheeze/shortness of breath/chest tightness.  May use 15-20 minutes prior to activity.   Monitor frequency of use.       - Dupixent injections discussed today for improved asthma control and well as better control of chronic rhinosinusitis.  Dupixent is done every 2 weeks and can be performed at home if able.  Benefits and risk discussed and brochure provided.  Will go ahead and start approval process.       Asthma control goals:   Full participation in all desired activities (may need albuterol before activity)  Albuterol use two time or less a week on average (not counting use with activity)  Cough interfering with sleep two time or less a month  Oral steroids no more than once a year  No hospitalizations  Allergic rhinitis with conjunctivitis    - continue avoidance measures for dust mites, cat, dog, grasses, trees, weeds    - continue Allegra 180mg  daily.    - saline rinses with budesonide twice a day (see below)   Budesonide (Pulmicort) + Saline Irrigation/Rinse  Budesonide (Pulmicort) is an anti-inflammatory steroid medication used to decrease nasal and sinus inflammation. It is dispensed in liquid form in a vial. Although it is manufactured for use with a nebulizer, we intend for you to use it with the NeilMed Sinus Rinse bottle (preferred) or a Neti pot.   Instructions:  1) Make 240cc of saline in the NeilMed bottle using the salt packets or your own saline recipe (see separate handout).  2) Add the entire 2cc vial of liquid Budesonide (Pulmicort) to the rinse  bottle and mix together.  3) While in the shower or over the sink, tilt your head forward to a comfortable level. Put the tip of the sinus rinse bottle in your nostril and aim it towards the crown or top of your head. Gently squeeze the bottle to flush out your nose. The fluid will circulate in and out of your sinus cavities, coming back out from either nostril or through your mouth. Try not to swallow large quantities and spit it out instead.  4) Perform Budesonide (Pulmicort) + Saline irrigations 2 times daily.    Follow-up 3-4 months or sooner if needed

## 2019-01-25 NOTE — Progress Notes (Signed)
Follow-up Note  RE: Monique Harrison MRN: [id number] DOB: [birth year] Date of Office Visit: 01/25/2019   History of present illness: Monique Harrison is a 43 y.o. female presenting today for follow-up of asthma, allergic rhinitis with conjunctivitis and history of recurrent sinusitis.  She was last seen in the office on 09/22/18 by myself.  She states since performing nasal saline rinses twice a day she has not had any further sinus infections however she still report significant nasal congestion especially of the left nostril.  She states she is using multiple packs of kleenex a day.  She continues to take Allegra.  She states singulair has not been helpful for either allergy symptom control or her asthma.  She states she recently developed itchy eyes and increase nasal congestion and she is not sure what she encountered to trigger these symptoms. She states her asthma is still not controlled and she continue to have cough and shortness of breath.  She state she can tell if she misses a Advair dose.  She states high dose Advair 1 puff twice a day.   We did attempt Berna Bue last year however she did not feel it provided much benefit.    Review of systems: Review of Systems  Constitutional: Negative for chills, fever and malaise/fatigue.  HENT: Positive for congestion and sinus pain. Negative for ear discharge, nosebleeds and sore throat.   Eyes: Negative for pain, discharge and redness.  Respiratory: Positive for cough and shortness of breath. Negative for sputum production.   Cardiovascular: Negative for chest pain.  Gastrointestinal: Negative.   Musculoskeletal: Negative.   Skin: Negative for itching and rash.  Neurological: Negative.     All other systems negative unless noted above in HPI  Past medical/social/surgical/family history have been reviewed and are unchanged unless specifically indicated below.  No changes  Medication List: Allergies as of 01/25/2019      Reactions   Aspirin Shortness Of Breath   Pt tolerates aspirin for occasional use.      Medication List       Accurate as of January 25, 2019  6:28 PM. If you have any questions, ask your nurse or doctor.        STOP taking these medications   montelukast 10 MG tablet Commonly known as: SINGULAIR Stopped by:  Charmian Muff, MD   pantoprazole 40 MG tablet Commonly known as: PROTONIX Stopped by:  Charmian Muff, MD   ursodiol 300 MG capsule Commonly known as: ACTIGALL Stopped by:  Charmian Muff, MD   Truett Perna 93 MCG/ACT [facility] Generic drug: Fluticasone Propionate Stopped by:  Charmian Muff, MD     TAKE these medications   albuterol 108 (90 Base) MCG/ACT inhaler Commonly known as: VENTOLIN HFA Inhale 2 puffs into the lungs every 4 (four) hours as needed. What changed: Another medication with the same name was removed. Continue taking this medication, and follow the directions you see here. Changed by:  Charmian Muff, MD   albuterol (2.5 MG/3ML) 0.083% nebulizer solution Commonly known as: PROVENTIL Take 3 mLs (2.5 mg total) by nebulization every 4 (four) hours as needed for wheezing or shortness of breath. What changed: Another medication with the same name was removed. Continue taking this medication, and follow the directions you see here. Changed by:  Charmian Muff, MD   ALLEGRA-D PO Take by mouth.   Fluticasone-Salmeterol 500-50 MCG/DOSE Aepb Commonly known as: Advair Diskus Inhale 1 puff into the lungs 2 (two) times daily. What changed: Another medication with the  same name was removed. Continue taking this medication, and follow the directions you see here. Changed by:  Charmian Muff, MD   Vitamin D3 75 MCG (3000 UT) Tabs Take 3,000 Units by mouth daily.       Known medication allergies: Allergies  Allergen Reactions  . Aspirin Shortness Of Breath    Pt tolerates aspirin for occasional use.      Physical examination: Blood pressure 122/82, pulse 95, temperature 97.7 F (36.5 C), temperature source Temporal, resp. rate 16, height 5\' 6"  (1.676 m), weight 284 lb (128.8 kg), SpO2 95 %.  General: Alert, interactive, in no acute distress. HEENT: PERRLA, TMs pearly gray, turbinates moderately edematous with clear discharge, post-pharynx non erythematous. Neck: Supple without lymphadenopathy. Lungs: Clear to auscultation without wheezing, rhonchi or rales. {no increased work of breathing. CV: Normal S1, S2 without murmurs. Abdomen: Nondistended, nontender. Skin: Warm and dry, without lesions or rashes. Extremities:  No clubbing, cyanosis or edema. Neuro:   Grossly intact.  Diagnositics/Labs: None today  Component     Latest Ref Rng & Units 02/07/2018  WBC     4.0 - 10.5 K/uL 9.8  RBC     3.87 - 5.11 MIL/uL 4.25  Hemoglobin     12.0 - 15.0 g/dL 12.8  HCT     36.0 - 46.0 % 39.2  MCV     80.0 - 100.0 fL 92.2  MCH     26.0 - 34.0 pg 30.1  MCHC     30.0 - 36.0 g/dL 32.7  RDW     11.5 - 15.5 % 13.2  Platelets     150 - 400 K/uL 262  nRBC     0.0 - 0.2 % 0.0  Neutrophils     % 72  NEUT#     1.7 - 7.7 K/uL 7.0  Lymphocytes     % 16  Lymphocyte #     0.7 - 4.0 K/uL 1.6  Monocytes Relative     % 6  Monocyte #     0.1 - 1.0 K/uL 0.6  Eosinophil     % 5  Eosinophils Absolute     0.0 - 0.5 K/uL 0.5  Basophil     % 1  Basophils Absolute     0.0 - 0.1 K/uL 0.1  Immature Granulocytes     % 0  Abs Immature Granulocytes     0.00 - 0.07 K/uL 0.03    Assessment and plan:   Asthma     - will have you trial Trelegy (triple medication therapy) 1 puff once a day.  If this works better than Advair for you let us know and will refill.   Hold Advair while on Trelegy trial     - if Trelegy is not more effective than Advair then can resume your regular dose Advair diskus 500/50 1 puff twice a day     - have access to albuterol inhaler 2 puffs or nebulizer 1 vial every 4-6  hours as needed for cough/wheeze/shortness of breath/chest tightness.  May use 15-20 minutes prior to activity.   Monitor frequency of use.       - Dupixent injections discussed today for improved asthma control and well as better control of chronic rhinosinusitis.  Dupixent is done every 2 weeks and can be performed at home if able.  Benefits and risk discussed and brochure provided.  Will go ahead and start approval process.       Asthma control goals:  Full participation in all desired activities (may need albuterol before activity)  Albuterol use two time or less a week on average (not counting use with activity)  Cough interfering with sleep two time or less a month  Oral steroids no more than once a year  No hospitalizations  Allergic rhinitis with conjunctivitis    - continue avoidance measures for dust mites, cat, dog, grasses, trees, weeds    - continue Allegra 180mg  daily.    - saline rinses with budesonide twice a day; instructions on how to perform provided.    - I do believe she would benefit from immunotherapy however her asthma has never been controlled enough to consider this option.  Follow-up 3-4 months or sooner if needed  I appreciate the opportunity to take part in [facility] care. Please do not hesitate to contact me with questions.  Sincerely,   Prudy Feeler, MD Allergy/Immunology [facility] [place]

## 2019-02-05 ENCOUNTER — Telehealth: Payer: Self-pay | Admitting: *Deleted

## 2019-02-05 NOTE — Telephone Encounter (Signed)
-----   Message from Savage, MD sent at 01/25/2019  6:31 PM EDT ----- Would like to start approval process for Dupixent for asthma.      She tried Berna Bue within past year which was not effective thus will try Dupixent at this time.

## 2019-02-05 NOTE — Telephone Encounter (Signed)
Called patient and advised Dr Nelva Bush request for patient to start [place] for add-on therapy for her asthma.  Advised patient of approval, copay card and submit to [facility]. Patient instructed to contact clinic to make appt upon medication arrival to start therapy

## 2019-02-19 ENCOUNTER — Other Ambulatory Visit: Payer: Self-pay

## 2019-02-19 MED ORDER — OLOPATADINE HCL 0.1 % OP SOLN: 1.0000 [drp] | Freq: Two times a day (BID) | OPHTHALMIC | 3 refills | Status: DC | PRN

## 2019-02-19 NOTE — Addendum Note (Signed)
Addended by: Garnet Sierras on: 02/19/2019 09:39 AM   Modules accepted: Orders

## 2019-02-19 NOTE — Telephone Encounter (Signed)
Thanks

## 2019-02-19 NOTE — Addendum Note (Signed)
Addended by: Garnet Sierras on: 02/19/2019 08:59 AM   Modules accepted: Orders

## 2019-02-19 NOTE — Telephone Encounter (Signed)
Please call patient.  Ask her to take picture of the eye.  Is she having any fevers? Vision changes? Eye pain?  If it's only itchy with some mild swelling then she can use the allergy eye drop I'm going to send in. Azelastine 0.1% 1 drop to both eyes as needed twice a day for itchy/watery eyes.  Do not use over contacts if she wears any.  If it's not improving and she has eye pain and/or vision changes then she may want to see her eye doctor.   May use over the counter antihistamines such as Zyrtec (cetirizine), Claritin (loratadine), Allegra (fexofenadine), or Xyzal (levocetirizine) daily as needed.  She can also take additional benadryl 25-50mg  every 4-6 hours as needed. This may make her drowsy.

## 2019-02-19 NOTE — Addendum Note (Signed)
Addended by: Valere Dross on: 02/19/2019 09:20 AM   Modules accepted: Orders

## 2019-02-19 NOTE — Telephone Encounter (Signed)
I sent in olopatadine 0.1%. this was covered by her insurance.

## 2019-02-19 NOTE — Telephone Encounter (Signed)
Patient was notified to send picture of eye and she is doing so as we speak. Patient is not experiencing any type of pain, more discomfort form itching, and has no symptoms of Fever, or vision changes. Patient states she is not taking any antihistamines due to she was on a nasal spray relieving her symptoms. Patient was advise to try OTC antihistamines and also we are sending an Rx for Azelastine 0.1% to [facility]. Patient made aware of no improvements to contact her eye doctor.  Please advise on correct dosage?

## 2019-02-19 NOTE — Telephone Encounter (Signed)
Patient called with complaint of left eye swelling and itching since yesterday. She says that she came in contact with something and is unsure of what it was. She told me that she took 50 mg Benadryl and used otc artificial tears yesterday. She says that she is still having some itching and swelling of the same eye but has only used cool water this morning. I advised to take 50 mg Benadryl seeing that she had done this previously. Patient had no other symptoms reported. I told her I would send a message to the provider for further recommendations.

## 2019-02-27 ENCOUNTER — Ambulatory Visit: Payer: Self-pay

## 2019-03-01 ENCOUNTER — Other Ambulatory Visit: Payer: Self-pay

## 2019-03-01 MED ORDER — PREDNISONE 20 MG PO TABS: 20.0000 mg | ORAL_TABLET | Freq: Two times a day (BID) | ORAL | 0 refills | Status: DC

## 2019-03-01 MED ORDER — FLUTICASONE-SALMETEROL 500-50 MCG/DOSE IN AEPB: 1.0000 | INHALATION_SPRAY | Freq: Two times a day (BID) | RESPIRATORY_TRACT | 5 refills | Status: DC

## 2019-03-05 ENCOUNTER — Other Ambulatory Visit: Payer: Self-pay

## 2019-03-05 ENCOUNTER — Ambulatory Visit (INDEPENDENT_AMBULATORY_CARE_PROVIDER_SITE_OTHER): Payer: 59

## 2019-03-05 DIAGNOSIS — J4550 Severe persistent asthma, uncomplicated: Secondary | ICD-10-CM | POA: Diagnosis not present

## 2019-03-05 MED ORDER — DUPILUMAB 300 MG/2ML ~~LOC~~ SOSY
600.0000 mg | PREFILLED_SYRINGE | Freq: Once | SUBCUTANEOUS | Status: AC
  Administered 2019-03-05: 600 mg via SUBCUTANEOUS

## 2019-03-05 NOTE — Progress Notes (Signed)
Immunotherapy   Patient Details  Name: Monique Harrison MRN: [id number] Date of Birth: 1975/08/14  03/05/2019  [name] started her [place] today. Patient received a loading dose of 600 mg. Patient waited in an exam room for 30 minutes with no problems. Frequency: 300 mg every 14 days Epi-Pen: Yes Consent signed and patient instructions given.   [name] 03/05/2019, 1:37 PM

## 2019-03-20 ENCOUNTER — Other Ambulatory Visit: Payer: Self-pay

## 2019-03-20 ENCOUNTER — Ambulatory Visit (INDEPENDENT_AMBULATORY_CARE_PROVIDER_SITE_OTHER): Payer: 59

## 2019-03-20 DIAGNOSIS — J4550 Severe persistent asthma, uncomplicated: Secondary | ICD-10-CM

## 2019-03-20 MED ORDER — DUPILUMAB 300 MG/2ML ~~LOC~~ SOSY
300.0000 mg | PREFILLED_SYRINGE | Freq: Once | SUBCUTANEOUS | Status: AC
  Administered 2019-03-20: 18:00:00 300 mg via SUBCUTANEOUS

## 2019-04-05 ENCOUNTER — Other Ambulatory Visit: Payer: Self-pay

## 2019-04-05 ENCOUNTER — Ambulatory Visit (INDEPENDENT_AMBULATORY_CARE_PROVIDER_SITE_OTHER): Payer: 59 | Admitting: *Deleted

## 2019-04-05 DIAGNOSIS — J4550 Severe persistent asthma, uncomplicated: Secondary | ICD-10-CM

## 2019-04-05 MED ORDER — DUPILUMAB 300 MG/2ML ~~LOC~~ SOSY
300.0000 mg | PREFILLED_SYRINGE | SUBCUTANEOUS | Status: DC
  Administered 2019-04-05 – 2019-10-30 (×13): 300 mg via SUBCUTANEOUS

## 2019-04-08 ENCOUNTER — Other Ambulatory Visit: Payer: Self-pay | Admitting: Allergy

## 2019-04-23 ENCOUNTER — Ambulatory Visit (INDEPENDENT_AMBULATORY_CARE_PROVIDER_SITE_OTHER): Payer: 59

## 2019-04-23 ENCOUNTER — Other Ambulatory Visit: Payer: Self-pay

## 2019-04-23 DIAGNOSIS — J4550 Severe persistent asthma, uncomplicated: Secondary | ICD-10-CM | POA: Diagnosis not present

## 2019-04-25 ENCOUNTER — Other Ambulatory Visit: Payer: Self-pay

## 2019-04-25 MED ORDER — BUDESONIDE 0.5 MG/2ML IN SUSP: RESPIRATORY_TRACT | 0 refills | Status: DC

## 2019-04-30 ENCOUNTER — Other Ambulatory Visit: Payer: Self-pay | Admitting: *Deleted

## 2019-05-10 ENCOUNTER — Ambulatory Visit: Payer: Self-pay

## 2019-05-16 ENCOUNTER — Ambulatory Visit (INDEPENDENT_AMBULATORY_CARE_PROVIDER_SITE_OTHER): Payer: 59 | Admitting: *Deleted

## 2019-05-16 ENCOUNTER — Other Ambulatory Visit: Payer: Self-pay

## 2019-05-16 DIAGNOSIS — J4550 Severe persistent asthma, uncomplicated: Secondary | ICD-10-CM | POA: Diagnosis not present

## 2019-05-24 ENCOUNTER — Other Ambulatory Visit: Payer: Self-pay

## 2019-05-24 MED ORDER — TRELEGY ELLIPTA 100-62.5-25 MCG/INH IN AEPB: 1.0000 | INHALATION_SPRAY | Freq: Every day | RESPIRATORY_TRACT | 0 refills | Status: DC

## 2019-05-25 ENCOUNTER — Ambulatory Visit: Payer: 59 | Admitting: Allergy

## 2019-05-25 ENCOUNTER — Other Ambulatory Visit: Payer: Self-pay

## 2019-05-25 ENCOUNTER — Encounter: Payer: Self-pay | Admitting: Allergy

## 2019-05-25 VITALS — BP 118/76 | HR 81 | Temp 98.3°F | Resp 16 | Ht 66.0 in | Wt 287.4 lb

## 2019-05-25 DIAGNOSIS — H1013 Acute atopic conjunctivitis, bilateral: Secondary | ICD-10-CM | POA: Diagnosis not present

## 2019-05-25 DIAGNOSIS — J4550 Severe persistent asthma, uncomplicated: Secondary | ICD-10-CM

## 2019-05-25 DIAGNOSIS — J3089 Other allergic rhinitis: Secondary | ICD-10-CM | POA: Diagnosis not present

## 2019-05-25 MED ORDER — TRELEGY ELLIPTA 200-62.5-25 MCG/INH IN AEPB: 1.0000 | INHALATION_SPRAY | Freq: Every day | RESPIRATORY_TRACT | 5 refills | Status: DC

## 2019-05-25 MED ORDER — ALBUTEROL SULFATE HFA 108 (90 BASE) MCG/ACT IN AERS: 2.0000 | INHALATION_SPRAY | RESPIRATORY_TRACT | 1 refills | Status: DC | PRN

## 2019-05-25 MED ORDER — BUDESONIDE 0.5 MG/2ML IN SUSP: RESPIRATORY_TRACT | 5 refills | Status: DC

## 2019-05-25 NOTE — Patient Instructions (Addendum)
Asthma     - will have you trial Trelegy (triple medication therapy) 1 puff once a day.  Provided with sample today in office.   If this works better than Advair for you let us know and will refill.   Hold Advair while on Trelegy trial     - if Trelegy is not more effective than Advair then can resume your regular dose Advair diskus 500/50 1 puff twice a day     - have access to albuterol inhaler 2 puffs or nebulizer 1 vial every 4-6 hours as needed for cough/wheeze/shortness of breath/chest tightness.  May use 15-20 minutes prior to activity.   Monitor frequency of use.       - Continue Dupixent injections every 2 weeks.  Asthma control does appear to be improved on Dupixent.       Asthma control goals:   Full participation in all desired activities (may need albuterol before activity)  Albuterol use two time or less a week on average (not counting use with activity)  Cough interfering with sleep two time or less a month  Oral steroids no more than once a year  No hospitalizations  Allergic rhinitis with conjunctivitis    - continue avoidance measures for dust mites, cat, dog, grasses, trees, weeds    - continue Allegra 180mg  daily.    - saline rinses with budesonide twice a day (see below)   Budesonide (Pulmicort) + Saline Irrigation/Rinse  Budesonide (Pulmicort) is an anti-inflammatory steroid medication used to decrease nasal and sinus inflammation. It is dispensed in liquid form in a vial. Although it is manufactured for use with a nebulizer, we intend for you to use it with the NeilMed Sinus Rinse bottle (preferred) or a Neti pot.   Instructions:  1) Make 240cc of saline in the NeilMed bottle using the salt packets or your own saline recipe (see separate handout).  2) Add the entire 2cc vial of liquid Budesonide (Pulmicort) to the rinse bottle and mix together.  3) While in the shower or over the sink, tilt your head forward to a comfortable level. Put the tip of the sinus rinse  bottle in your nostril and aim it towards the crown or top of your head. Gently squeeze the bottle to flush out your nose. The fluid will circulate in and out of your sinus cavities, coming back out from either nostril or through your mouth. Try not to swallow large quantities and spit it out instead.  4) Perform Budesonide (Pulmicort) + Saline irrigations 2 times daily.    Follow-up 3-4 months or sooner if needed

## 2019-05-25 NOTE — Progress Notes (Signed)
Follow-up Note  RE: Monique Harrison MRN: [id number] DOB: 11/01/1975 Date of Office Visit: 05/25/2019   History of present illness: Monique Harrison is a 44 y.o. female presenting today for follow-up of asthma and allergic rhinitis with conjunctivitis.  She was last seen in the office on 01/25/2019 by myself.  Since this visit she states the [place] was too expensive to get (and we did not have any samples in office at the time) that she has continued on her Advair.  She did get started on Dupixent in November and has been receiving injections every 2 weeks and tolerating these.  She states since starting on Dupixent she is only needed to use her albuterol nebulizer twice which she does feel is an improvement for her.  She has not needed any ED or urgent care visits or any systemic steroid needs. She does perform budesonide nasal saline rinses once a day at this time as she states she is trying to preserve the budesonide vials for as long as possible.  She does take Allegra daily.  She states that she was quite congested and sneezing a lot yesterday which rain seems to trigger the symptoms.  She does feel better today. 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  Review of systems: Review of Systems  Constitutional: Negative for chills, fever and malaise/fatigue.  HENT: Positive for congestion.   Eyes: Negative.   Respiratory: Positive for cough.   Cardiovascular: Negative.   Gastrointestinal: Negative.   Musculoskeletal: Negative.   Skin: Negative.   Neurological: Negative.     All other systems negative unless noted above in HPI  Past medical/social/surgical/family history have been reviewed and are unchanged unless specifically indicated below.  No changes  Medication List: Current Outpatient Medications  Medication Sig Dispense Refill  . albuterol (PROVENTIL) (2.5 MG/3ML) 0.083% nebulizer solution Take 3 mLs (2.5 mg total) by  nebulization every 4 (four) hours as needed for wheezing or shortness of breath. 75 mL 1  . albuterol (VENTOLIN HFA) 108 (90 Base) MCG/ACT inhaler Inhale 2 puffs into the lungs every 4 (four) hours as needed. 1 Inhaler 1  . budesonide (PULMICORT) 0.5 MG/2ML nebulizer solution 1 VIAL TWICE A DAY IN SALINE RINSE 360 mL 5  . Cholecalciferol (VITAMIN D3) 3000 units TABS Take 3,000 Units by mouth daily.    Marland Kitchen Fexofenadine-Pseudoephedrine (ALLEGRA-D PO) Take by mouth.    . Fluticasone-Salmeterol (ADVAIR DISKUS) 500-50 MCG/DOSE AEPB Inhale 1 puff into the lungs 2 (two) times daily. 60 each 5  . olopatadine (PATANOL) 0.1 % ophthalmic solution Place 1 drop into both eyes 2 (two) times daily as needed for allergies (itchy eyes). 5 mL 3  . albuterol (VENTOLIN HFA) 108 (90 Base) MCG/ACT inhaler Inhale 2 puffs into the lungs every 4 (four) hours as needed. 18 g 1  . Fluticasone-Umeclidin-Vilant (TRELEGY ELLIPTA) 100-62.5-25 MCG/INH AEPB Inhale 1 puff into the lungs daily. (Patient not taking: Reported on 05/25/2019) 60 each 0  . Fluticasone-Umeclidin-Vilant (TRELEGY ELLIPTA) 200-62.5-25 MCG/INH AEPB Inhale 1 puff into the lungs daily. 60 each 5   Current Facility-Administered Medications  Medication Dose Route Frequency Provider Last Rate Last Admin  . dupilumab (DUPIXENT) prefilled syringe 300 mg  300 mg Subcutaneous Q14 Days Kennith Gain, MD   300 mg at 05/16/19 1329     Known medication allergies: Allergies  Allergen Reactions  . Aspirin Shortness Of Breath    Pt tolerates aspirin for  occasional use.     Physical examination: Blood pressure 118/76, pulse 81, temperature 98.3 F (36.8 C), temperature source Temporal, resp. rate 16, height 5\' 6"  (1.676 m), weight 287 lb 6.4 oz (130.4 kg), SpO2 95 %.  General: Alert, interactive, in no acute distress. HEENT: PERRLA, TMs pearly gray, turbinates moderately edematous without discharge, post-pharynx non erythematous. Neck: Supple without  lymphadenopathy. Lungs: Clear to auscultation without wheezing, rhonchi or rales. {no increased work of breathing. CV: Normal S1, S2 without murmurs. Abdomen: Nondistended, nontender. Skin: Warm and dry, without lesions or rashes. Extremities:  No clubbing, cyanosis or edema. Neuro:   Grossly intact.  Diagnositics/Labs: Spirometry: FEV1: 1.73L 65%, FVC: 2.16L 66%, ratio consistent with restrictive pattern.   This is improved from previous study  Assessment and plan:   Asthma, severe persistent     - will have you trial Trelegy (triple medication therapy) 1 puff once a day.  Provided with sample today in office.   If this works better than Advair for you let us know and will refill.   Hold Advair while on Trelegy trial     - if Trelegy is not more effective than Advair then can resume your regular dose Advair diskus 500/50 1 puff twice a day     - have access to albuterol inhaler 2 puffs or nebulizer 1 vial every 4-6 hours as needed for cough/wheeze/shortness of breath/chest tightness.  May use 15-20 minutes prior to activity.   Monitor frequency of use.       - Continue Dupixent injections every 2 weeks.  Asthma control does appear to be improved on Dupixent.       Asthma control goals:   Full participation in all desired activities (may need albuterol before activity)  Albuterol use two time or less a week on average (not counting use with activity)  Cough interfering with sleep two time or less a month  Oral steroids no more than once a year  No hospitalizations  Allergic rhinitis with conjunctivitis    - continue avoidance measures for dust mites, cat, dog, grasses, trees, weeds    - continue Allegra 180mg  daily.    - saline rinses with budesonide twice a day    Follow-up 3-4 months or sooner if needed  I appreciate the opportunity to take part in [place] care. Please do not hesitate to contact me with questions.  Sincerely,   Prudy Feeler,  MD Allergy/Immunology [facility] [place]

## 2019-05-25 NOTE — Addendum Note (Signed)
Addended by: Lucrezia Starch I on: 05/25/2019 05:00 PM   Modules accepted: Orders

## 2019-05-28 ENCOUNTER — Telehealth: Payer: Self-pay

## 2019-05-28 NOTE — Telephone Encounter (Signed)
Prior authorization for Trelegy has been submitted via covermymeds. Currently pending approval/denial.

## 2019-05-29 NOTE — Telephone Encounter (Signed)
Prior Monique Harrison was denied. No alternatives given. I will check the formulary and see what I can find.

## 2019-05-31 ENCOUNTER — Ambulatory Visit: Payer: Self-pay

## 2019-05-31 NOTE — Telephone Encounter (Signed)
Upon review of patient's formulary:  Arnuity, Pulmicort Flexhaler and Flovent HFA/Diskus are all Tier 1  Spirva Handihaler/Respimat is Tier 2.   Please advise and thank you.

## 2019-05-31 NOTE — Telephone Encounter (Signed)
Can we see if she can tell if Trelegy is more effective than her Advair? We discussed at appt that if the coverage of Trelegy was still too much to pay then she may have to continue on her Advair.  But if she notes a significant improvement then may need to do a peer-to-peer or something to see if we can get it approved.

## 2019-05-31 NOTE — Addendum Note (Signed)
Addended by: Neomia Dear on: 05/31/2019 02:08 PM   Modules accepted: Orders

## 2019-05-31 NOTE — Telephone Encounter (Signed)
Ok thanks for looking into that.  Will have to stay on Trelegy for now while the coupon card works.

## 2019-05-31 NOTE — Telephone Encounter (Signed)
Call to pharmacy.  [employer] was billed and the copay card.  Pharmacist was unable to tell me what the cost that was paid from [facility].  The cost of the inhaler alone is $724.99.

## 2019-05-31 NOTE — Telephone Encounter (Signed)
Call to patient and she was able to get Trelegy using the coupon copay card.  Pt states that the Trelegy works better than the Advair.  But it is still not approved on her insurance.  Pt would like to continue using this medication.

## 2019-06-04 ENCOUNTER — Ambulatory Visit: Payer: Self-pay

## 2019-06-05 ENCOUNTER — Other Ambulatory Visit: Payer: Self-pay | Admitting: Ophthalmology

## 2019-06-07 ENCOUNTER — Ambulatory Visit: Payer: Self-pay

## 2019-06-11 ENCOUNTER — Other Ambulatory Visit: Payer: Self-pay

## 2019-06-11 ENCOUNTER — Ambulatory Visit (INDEPENDENT_AMBULATORY_CARE_PROVIDER_SITE_OTHER): Payer: 59

## 2019-06-11 DIAGNOSIS — J4550 Severe persistent asthma, uncomplicated: Secondary | ICD-10-CM

## 2019-06-26 ENCOUNTER — Ambulatory Visit (INDEPENDENT_AMBULATORY_CARE_PROVIDER_SITE_OTHER): Payer: 59

## 2019-06-26 ENCOUNTER — Other Ambulatory Visit: Payer: Self-pay

## 2019-06-26 DIAGNOSIS — J4550 Severe persistent asthma, uncomplicated: Secondary | ICD-10-CM | POA: Diagnosis not present

## 2019-07-10 ENCOUNTER — Ambulatory Visit: Payer: Self-pay

## 2019-07-12 ENCOUNTER — Ambulatory Visit (INDEPENDENT_AMBULATORY_CARE_PROVIDER_SITE_OTHER): Payer: 59

## 2019-07-12 ENCOUNTER — Other Ambulatory Visit: Payer: Self-pay

## 2019-07-12 DIAGNOSIS — J4550 Severe persistent asthma, uncomplicated: Secondary | ICD-10-CM

## 2019-07-19 ENCOUNTER — Telehealth: Payer: Self-pay | Admitting: Allergy

## 2019-07-19 NOTE — Telephone Encounter (Signed)
Patient called and thinks she has a sinus infection. Patient states she is very congested in the back of throat and coughing up green mucus. Patient states she has only taken Allegra D which has not helped at all. Patient would like to know if antibiotics could be called in. Patient uses [facility] on [address].  Please advise.

## 2019-07-19 NOTE — Telephone Encounter (Signed)
FYI Dr. Nelva Bush. Thursday pack for Prednisone has been placed up front for the patient to pick up.

## 2019-07-19 NOTE — Telephone Encounter (Signed)
With symptoms only being present for the past day it may not be bacterial in nature.  Would recommend she perform Covid testing to ensure it is not Covid illness leading to symptoms.  Would recommend at this time treat with prednisone to help with flare of asthma as well as congestion.  Would do a Thursday pack.  She can take Mucinex to help thin the mucus to help mobilize and move.  Mucinex needs to be taken with tall glass of water to be effective.  She should continue her routine medications and ok to use Allegra-D temporarily at this time.    If symptoms do not improve with prednisone then would recommend an OV.

## 2019-07-19 NOTE — Telephone Encounter (Signed)
Spoke to patient and informed her about the Covid testing and she states it isn't Covid because this happens every time the weather changes. I informed her to go to our [place] office to pick up a Thursday prednisone pack, she was also informed she can take the Mucinex with a tall glass of water, and to continue to take her routine medications and she can use Allergra-D temporarily at this time. I also let her know if symptoms doesn't get any better to make an office visit.

## 2019-07-19 NOTE — Telephone Encounter (Signed)
Patient has congestion in the throat and when she cough she has green/dark/yellow color mucus. Patient stated her symptoms started yesterday and has taken the antihistamines such as Allergra-D to relieve the congestion but it doesn't seem to be helping. She stated she hasn't taken any Mucinex or cough medicine and states it seems to be stuck. Asthma is being affected and feels a rumble in her chest and is having difficult times breathing with her mask on. She has taken multiple breathing treatment with her albuterol nebulizer. She is taking Trelegy for her asthma. Please advise if she needs to be seen or can we call in something in for the patient.

## 2019-07-26 ENCOUNTER — Ambulatory Visit (INDEPENDENT_AMBULATORY_CARE_PROVIDER_SITE_OTHER): Payer: 59

## 2019-07-26 ENCOUNTER — Other Ambulatory Visit: Payer: Self-pay

## 2019-07-26 DIAGNOSIS — J4550 Severe persistent asthma, uncomplicated: Secondary | ICD-10-CM | POA: Diagnosis not present

## 2019-07-30 ENCOUNTER — Other Ambulatory Visit: Payer: Self-pay | Admitting: Ophthalmology

## 2019-08-09 ENCOUNTER — Ambulatory Visit (INDEPENDENT_AMBULATORY_CARE_PROVIDER_SITE_OTHER): Payer: 59

## 2019-08-09 ENCOUNTER — Other Ambulatory Visit: Payer: Self-pay

## 2019-08-09 DIAGNOSIS — J4550 Severe persistent asthma, uncomplicated: Secondary | ICD-10-CM | POA: Diagnosis not present

## 2019-08-23 ENCOUNTER — Other Ambulatory Visit: Payer: Self-pay

## 2019-08-23 ENCOUNTER — Ambulatory Visit (INDEPENDENT_AMBULATORY_CARE_PROVIDER_SITE_OTHER): Payer: 59

## 2019-08-23 DIAGNOSIS — J4550 Severe persistent asthma, uncomplicated: Secondary | ICD-10-CM

## 2019-09-06 ENCOUNTER — Ambulatory Visit: Payer: Self-pay

## 2019-09-11 ENCOUNTER — Ambulatory Visit (INDEPENDENT_AMBULATORY_CARE_PROVIDER_SITE_OTHER): Payer: 59

## 2019-09-11 ENCOUNTER — Other Ambulatory Visit: Payer: Self-pay

## 2019-09-11 DIAGNOSIS — J4550 Severe persistent asthma, uncomplicated: Secondary | ICD-10-CM | POA: Diagnosis not present

## 2019-09-27 ENCOUNTER — Ambulatory Visit (INDEPENDENT_AMBULATORY_CARE_PROVIDER_SITE_OTHER): Payer: 59

## 2019-09-27 ENCOUNTER — Ambulatory Visit: Payer: 59 | Admitting: Allergy

## 2019-09-27 ENCOUNTER — Other Ambulatory Visit: Payer: Self-pay

## 2019-09-27 ENCOUNTER — Encounter: Payer: Self-pay | Admitting: Allergy

## 2019-09-27 VITALS — BP 110/72 | HR 82 | Temp 98.0°F | Resp 18

## 2019-09-27 DIAGNOSIS — H1013 Acute atopic conjunctivitis, bilateral: Secondary | ICD-10-CM | POA: Diagnosis not present

## 2019-09-27 DIAGNOSIS — J329 Chronic sinusitis, unspecified: Secondary | ICD-10-CM | POA: Diagnosis not present

## 2019-09-27 DIAGNOSIS — J3089 Other allergic rhinitis: Secondary | ICD-10-CM

## 2019-09-27 DIAGNOSIS — J4550 Severe persistent asthma, uncomplicated: Secondary | ICD-10-CM

## 2019-09-27 MED ORDER — AMOXICILLIN-POT CLAVULANATE 875-125 MG PO TABS: 1.0000 | ORAL_TABLET | Freq: Two times a day (BID) | ORAL | 0 refills | Status: AC

## 2019-09-27 MED ORDER — AZITHROMYCIN 250 MG PO TABS: ORAL_TABLET | ORAL | 0 refills | Status: DC

## 2019-09-27 NOTE — Patient Instructions (Addendum)
Asthma     - continue Trelegy (triple medication therapy) 1 puff once a day.    If this works better than Advair for you let us know and will refill.   Hold Advair while on Trelegy trial     - have access to albuterol inhaler 2 puffs or nebulizer 1 vial every 4-6 hours as needed for cough/wheeze/shortness of breath/chest tightness.  May use 15-20 minutes prior to activity.   Monitor frequency of use.       - Continue Dupixent injections every 2 weeks.      - Discussed today options if we change from [place].  Cinqair is asthma biologic medication that is a monthly infusion that is weight-based dosing.       - will treat your sinuses as below and hope that improving sinus symptoms with improve asthma symptoms       Asthma control goals:   Full participation in all desired activities (may need albuterol before activity)  Albuterol use two time or less a week on average (not counting use with activity)  Cough interfering with sleep two time or less a month  Oral steroids no more than once a year  No hospitalizations  Allergic rhinitis with conjunctivitis    - continue avoidance measures for dust mites, cat, dog, grasses, trees, weeds    - continue Allegra 180mg  daily.    - saline rinses with budesonide twice a day (see below)   Recurrent sinusitis  - will obtain immunocompetence work-up  - will obtain sinus CT to evaluate your sinuses  - to treat current symptoms take Augmentin 875mg  twice a day for 10 days.  Let us know how you feel in regards to your sinus congestion.   - once you complete Augmentin course then recommend you perform prophylactic antibiotic course with Azithromycin 250mg  1 tab on Monday, Wednesday, Friday for 4 weeks.    Follow-up 3-4 months or sooner if needed  Budesonide (Pulmicort) + Saline Irrigation/Rinse  Budesonide (Pulmicort) is an anti-inflammatory steroid medication used to decrease nasal and sinus inflammation. It is dispensed in liquid form in a vial.  Although it is manufactured for use with a nebulizer, we intend for you to use it with the NeilMed Sinus Rinse bottle (preferred) or a Neti pot.   Instructions:  1) Make 240cc of saline in the NeilMed bottle using the salt packets or your own saline recipe (see separate handout).  2) Add the entire 2cc vial of liquid Budesonide (Pulmicort) to the rinse bottle and mix together.  3) While in the shower or over the sink, tilt your head forward to a comfortable level. Put the tip of the sinus rinse bottle in your nostril and aim it towards the crown or top of your head. Gently squeeze the bottle to flush out your nose. The fluid will circulate in and out of your sinus cavities, coming back out from either nostril or through your mouth. Try not to swallow large quantities and spit it out instead.  4) Perform Budesonide (Pulmicort) + Saline irrigations 2 times daily

## 2019-09-27 NOTE — Progress Notes (Signed)
Follow-up Note  RE: Monique Harrison MRN: [id number] DOB: [birth year] Date of Office Visit: 09/27/2019   History of present illness: Monique Harrison is a 44 y.o. female presenting today for follow-up of asthma, allergic rhinoconjunctivitis.  She was last seen in the office on 05/25/2019 by myself.  She states ever since February/March when the weather started to change she has had more congestion and she feels like this is impacting her asthma.  She states her head her sinuses and her ears are all clogged up.  She is coughing up thick yellow to green mucus.  She states over the past several weeks she has needed to use more albuterol that she has before.  She states she needed to use her nebulizer at least twice a week and her albuterol at least 1-2 times a week.  She states she does not think the Dupixent is working anymore.  She initially reports the Dupixent did well in reducing her symptoms but she feels like it has worn off.  She had a similar effect when we tried [place].  She continues on Trelegy 1 puff once a day and Dupixent injections every 2 weeks. She continues taking Allegra daily as well as using her budesonide saline rinses twice a day. She states with her sinusitis she has had recurrent infections requiring antibiotics about 4 times a year.  Review of systems: Review of Systems  Constitutional: Negative.   HENT: Positive for congestion and sinus pain.   Eyes: Negative.   Respiratory: Positive for cough, shortness of breath and wheezing.   Cardiovascular: Negative.   Gastrointestinal: Negative.   Musculoskeletal: Negative.   Skin: Negative.   Neurological: Negative.     All other systems negative unless noted above in HPI  Past medical/social/surgical/family history have been reviewed and are unchanged unless specifically indicated below.  No changes  Medication List: Current Outpatient Medications  Medication Sig Dispense Refill  . albuterol (PROVENTIL) (2.5 MG/3ML)  0.083% nebulizer solution Take 3 mLs (2.5 mg total) by nebulization every 4 (four) hours as needed for wheezing or shortness of breath. 75 mL 1  . albuterol (VENTOLIN HFA) 108 (90 Base) MCG/ACT inhaler Inhale 2 puffs into the lungs every 4 (four) hours as needed. 1 Inhaler 1  . albuterol (VENTOLIN HFA) 108 (90 Base) MCG/ACT inhaler Inhale 2 puffs into the lungs every 4 (four) hours as needed. 18 g 1  . Cholecalciferol (VITAMIN D3) 3000 units TABS Take 3,000 Units by mouth daily.    Marland Kitchen Fexofenadine-Pseudoephedrine (ALLEGRA-D PO) Take by mouth.    . Fluticasone-Umeclidin-Vilant (TRELEGY ELLIPTA) 200-62.5-25 MCG/INH AEPB Inhale 1 puff into the lungs daily. 60 each 5  . olopatadine (PATANOL) 0.1 % ophthalmic solution Place 1 drop into both eyes 2 (two) times daily as needed for allergies (itchy eyes). 5 mL 3  . amoxicillin-clavulanate (AUGMENTIN) 875-125 MG tablet Take 1 tablet by mouth 2 (two) times daily for 10 days. 20 tablet 0  . azithromycin (ZITHROMAX) 250 MG tablet 1 tablet on Monday, Wednesday, Friday for 4 week after completing Augmentin. 12 each 0   Current Facility-Administered Medications  Medication Dose Route Frequency Provider Last Rate Last Admin  . dupilumab (DUPIXENT) prefilled syringe 300 mg  300 mg Subcutaneous Q14 Days Kennith Gain, MD   300 mg at 09/27/19 1523     Known medication allergies: Allergies  Allergen Reactions  . Aspirin Shortness Of Breath    Pt tolerates aspirin for occasional use.     Physical examination: Blood pressure  110/72, pulse 82, temperature 98 F (36.7 C), temperature source Temporal, resp. rate 18, SpO2 96 %.  General: Alert, interactive, in no acute distress. HEENT: PERRLA, TMs pearly gray, turbinates moderately edematous with thick discharge, post-pharynx non erythematous. Neck: Supple without lymphadenopathy. Lungs: Mildly decreased breath sounds with expiratory wheezing bilaterally. {no increased work of breathing. CV: Normal  S1, S2 without murmurs. Abdomen: Nondistended, nontender. Skin: Warm and dry, without lesions or rashes. Extremities:  No clubbing, cyanosis or edema. Neuro:   Grossly intact.  Diagnositics/Labs: Spirometry: FEV1: 1.61L 61%, FVC: 2.0L 61%, ratio consistent with To restrictive pattern.  Slightly reduced from prior study but pretty stable overall  Assessment and plan:   Asthma, severe persistent     - continue Trelegy (triple medication therapy) 1 puff once a day.    If this works better than Advair for you let us know and will refill.   Hold Advair while on Trelegy trial     - have access to albuterol inhaler 2 puffs or nebulizer 1 vial every 4-6 hours as needed for cough/wheeze/shortness of breath/chest tightness.  May use 15-20 minutes prior to activity.   Monitor frequency of use.       - Continue Dupixent injections every 2 weeks.      - Discussed today options if we change from [place].  Cinqair is asthma biologic medication that is a monthly infusion that is weight-based dosing.       - will treat your sinuses as below and hope that improving sinus symptoms with improve asthma symptoms       Asthma control goals:   Full participation in all desired activities (may need albuterol before activity)  Albuterol use two time or less a week on average (not counting use with activity)  Cough interfering with sleep two time or less a month  Oral steroids no more than once a year  No hospitalizations  Allergic rhinitis with conjunctivitis    - continue avoidance measures for dust mites, cat, dog, grasses, trees, weeds    - continue Allegra 180mg  daily.    - saline rinses with budesonide twice a day (see below)   Recurrent sinusitis  - will obtain immunocompetence work-up  - will obtain sinus CT to evaluate your sinuses  - to treat current symptoms take Augmentin 875mg  twice a day for 10 days.  Let us know how you feel in regards to your sinus congestion.   - once you complete  Augmentin course then recommend you perform prophylactic antibiotic course with Azithromycin 250mg  1 tab on Monday, Wednesday, Friday for 4 weeks.    Follow-up 3-4 months or sooner if needed   I appreciate the opportunity to take part in [place] care. Please do not hesitate to contact me with questions.  Sincerely,   Monique Feeler, MD Allergy/Immunology [facility] [place]

## 2019-10-05 LAB — IGG, IGA, IGM
IgA/Immunoglobulin A, Serum: 347 mg/dL (ref 87–352)
IgG (Immunoglobin G), Serum: 1558 mg/dL (ref 586–1602)
IgM (Immunoglobulin M), Srm: 297 mg/dL — ABNORMAL HIGH (ref 26–217)

## 2019-10-05 LAB — STREP PNEUMONIAE 23 SEROTYPES IGG
Pneumo Ab Type 1*: 0.4 ug/mL — ABNORMAL LOW (ref >1.3)
Pneumo Ab Type 12 (12F)*: 0.3 ug/mL — ABNORMAL LOW (ref >1.3)
Pneumo Ab Type 14*: 2.7 ug/mL (ref >1.3)
Pneumo Ab Type 17 (17F)*: 0.3 ug/mL — ABNORMAL LOW (ref >1.3)
Pneumo Ab Type 19 (19F)*: 1.2 ug/mL — ABNORMAL LOW (ref >1.3)
Pneumo Ab Type 2*: 0.4 ug/mL — ABNORMAL LOW (ref >1.3)
Pneumo Ab Type 20*: 1 ug/mL — ABNORMAL LOW (ref >1.3)
Pneumo Ab Type 22 (22F)*: 0.3 ug/mL — ABNORMAL LOW (ref >1.3)
Pneumo Ab Type 23 (23F)*: 0.9 ug/mL — ABNORMAL LOW (ref >1.3)
Pneumo Ab Type 26 (6B)*: 1.5 ug/mL (ref >1.3)
Pneumo Ab Type 3*: 2.4 ug/mL (ref >1.3)
Pneumo Ab Type 34 (10A)*: 0.2 ug/mL — ABNORMAL LOW (ref >1.3)
Pneumo Ab Type 4*: 0.2 ug/mL — ABNORMAL LOW (ref >1.3)
Pneumo Ab Type 43 (11A)*: 1.2 ug/mL — ABNORMAL LOW (ref >1.3)
Pneumo Ab Type 5*: 0.1 ug/mL — ABNORMAL LOW (ref >1.3)
Pneumo Ab Type 51 (7F)*: 0.5 ug/mL — ABNORMAL LOW (ref >1.3)
Pneumo Ab Type 54 (15B)*: 2.9 ug/mL (ref >1.3)
Pneumo Ab Type 56 (18C)*: 0.8 ug/mL — ABNORMAL LOW (ref >1.3)
Pneumo Ab Type 57 (19A)*: 1.8 ug/mL (ref >1.3)
Pneumo Ab Type 68 (9V)*: 0.9 ug/mL — ABNORMAL LOW (ref >1.3)
Pneumo Ab Type 70 (33F)*: 0.5 ug/mL — ABNORMAL LOW (ref >1.3)
Pneumo Ab Type 8*: 0.1 ug/mL — ABNORMAL LOW (ref >1.3)
Pneumo Ab Type 9 (9N)*: 0.9 ug/mL — ABNORMAL LOW (ref >1.3)

## 2019-10-05 LAB — CBC WITH DIFFERENTIAL
Basophils Absolute: 0.1 10*3/uL (ref 0.0–0.2)
Basos: 1 %
EOS (ABSOLUTE): 0.5 10*3/uL — ABNORMAL HIGH (ref 0.0–0.4)
Eos: 5 %
Hematocrit: 42.9 % (ref 34.0–46.6)
Hemoglobin: 13.9 g/dL (ref 11.1–15.9)
Immature Grans (Abs): 0 10*3/uL (ref 0.0–0.1)
Immature Granulocytes: 0 %
Lymphocytes Absolute: 2.3 10*3/uL (ref 0.7–3.1)
Lymphs: 25 %
MCH: 29 pg (ref 26.6–33.0)
MCHC: 32.4 g/dL (ref 31.5–35.7)
MCV: 90 fL (ref 79–97)
Monocytes Absolute: 0.5 10*3/uL (ref 0.1–0.9)
Monocytes: 5 %
Neutrophils Absolute: 5.9 10*3/uL (ref 1.4–7.0)
Neutrophils: 64 %
RBC: 4.79 x10E6/uL (ref 3.77–5.28)
RDW: 12.7 % (ref 11.7–15.4)
WBC: 9.3 10*3/uL (ref 3.4–10.8)

## 2019-10-05 LAB — DIPHTHERIA / TETANUS ANTIBODY PANEL
Diphtheria Ab: <0.1 IU/mL — ABNORMAL LOW (ref <0.10)
Tetanus Ab, IgG: 0.6 IU/mL (ref <0.10)

## 2019-10-05 LAB — COMPLEMENT, TOTAL: Compl, Total (CH50): >60 U/mL (ref >41)

## 2019-10-05 LAB — IGE: IgE (Immunoglobulin E), Serum: 39 IU/mL (ref 6–495)

## 2019-10-09 ENCOUNTER — Ambulatory Visit (INDEPENDENT_AMBULATORY_CARE_PROVIDER_SITE_OTHER): Payer: 59

## 2019-10-09 ENCOUNTER — Other Ambulatory Visit: Payer: Self-pay

## 2019-10-09 ENCOUNTER — Ambulatory Visit: Payer: Self-pay

## 2019-10-09 DIAGNOSIS — Z23 Encounter for immunization: Secondary | ICD-10-CM

## 2019-10-09 DIAGNOSIS — J329 Chronic sinusitis, unspecified: Secondary | ICD-10-CM

## 2019-10-09 NOTE — Progress Notes (Signed)
Monique Harrison received the PneumoVax 23 in her left deltoid. She signed consent. Patient waited 30 minutes in office following injection and had no local reactions or systemic symptoms.

## 2019-10-09 NOTE — Addendum Note (Signed)
Addended by: Lucrezia Starch I on: 10/09/2019 09:25 AM   Modules accepted: Orders

## 2019-10-11 ENCOUNTER — Ambulatory Visit: Payer: Self-pay

## 2019-10-16 ENCOUNTER — Ambulatory Visit (INDEPENDENT_AMBULATORY_CARE_PROVIDER_SITE_OTHER): Payer: 59

## 2019-10-16 ENCOUNTER — Other Ambulatory Visit: Payer: Self-pay

## 2019-10-16 DIAGNOSIS — J4550 Severe persistent asthma, uncomplicated: Secondary | ICD-10-CM

## 2019-10-19 ENCOUNTER — Ambulatory Visit
Admission: RE | Admit: 2019-10-19 | Discharge: 2019-10-19 | Disposition: A | Discharge status: Home / Self Care | Payer: 59 | Source: Ambulatory Visit | Attending: Allergy | Admitting: Allergy

## 2019-10-23 ENCOUNTER — Telehealth: Payer: Self-pay | Admitting: Allergy

## 2019-10-23 NOTE — Telephone Encounter (Signed)
Patient called returning Javier's phone call regarding a scan on her sinuses.  Please advise.

## 2019-10-24 NOTE — Telephone Encounter (Signed)
Please advise to recall

## 2019-10-24 NOTE — Telephone Encounter (Signed)
Patient notified

## 2019-10-30 ENCOUNTER — Other Ambulatory Visit: Payer: Self-pay

## 2019-10-30 ENCOUNTER — Ambulatory Visit (INDEPENDENT_AMBULATORY_CARE_PROVIDER_SITE_OTHER): Payer: 59

## 2019-10-30 DIAGNOSIS — J4550 Severe persistent asthma, uncomplicated: Secondary | ICD-10-CM | POA: Diagnosis not present

## 2019-11-13 ENCOUNTER — Ambulatory Visit: Payer: Self-pay

## 2020-01-24 ENCOUNTER — Ambulatory Visit: Payer: 59 | Admitting: Allergy

## 2020-03-04 ENCOUNTER — Telehealth: Payer: Self-pay | Admitting: Allergy

## 2020-03-04 MED ORDER — ALBUTEROL SULFATE (2.5 MG/3ML) 0.083% IN NEBU: INHALATION_SOLUTION | RESPIRATORY_TRACT | 1 refills | Status: DC

## 2020-03-04 NOTE — Telephone Encounter (Signed)
[place] sent to [facility] as requested.

## 2020-03-04 NOTE — Telephone Encounter (Signed)
Patient needs refill on albuterol that goes into nebulizer machine, patient states she called at 12:12pm today, but no message was put back. Please send to [facility] on [address].

## 2020-04-10 ENCOUNTER — Ambulatory Visit: Payer: Self-pay | Admitting: Allergy

## 2020-04-15 ENCOUNTER — Telehealth: Payer: Self-pay

## 2020-04-15 ENCOUNTER — Ambulatory Visit: Payer: Managed Care, Other (non HMO) | Admitting: Allergy

## 2020-04-15 ENCOUNTER — Other Ambulatory Visit: Payer: Self-pay

## 2020-04-15 ENCOUNTER — Encounter: Payer: Self-pay | Admitting: Allergy

## 2020-04-15 VITALS — BP 108/72 | HR 86 | Resp 16

## 2020-04-15 DIAGNOSIS — R768 Other specified abnormal immunological findings in serum: Secondary | ICD-10-CM

## 2020-04-15 DIAGNOSIS — J0100 Acute maxillary sinusitis, unspecified: Secondary | ICD-10-CM | POA: Diagnosis not present

## 2020-04-15 DIAGNOSIS — J4550 Severe persistent asthma, uncomplicated: Secondary | ICD-10-CM | POA: Diagnosis not present

## 2020-04-15 DIAGNOSIS — J3089 Other allergic rhinitis: Secondary | ICD-10-CM | POA: Diagnosis not present

## 2020-04-15 DIAGNOSIS — H1013 Acute atopic conjunctivitis, bilateral: Secondary | ICD-10-CM

## 2020-04-15 MED ORDER — IPRATROPIUM BROMIDE 0.06 % NA SOLN
2.0000 | Freq: Two times a day (BID) | NASAL | 5 refills | Status: DC
Start: 2020-04-15 — End: 2021-02-03

## 2020-04-15 MED ORDER — CARBINOXAMINE MALEATE 6 MG PO TABS: 1.0000 | ORAL_TABLET | Freq: Two times a day (BID) | ORAL | 5 refills | Status: DC

## 2020-04-15 MED ORDER — TRELEGY ELLIPTA 200-62.5-25 MCG/INH IN AEPB: 1.0000 | INHALATION_SPRAY | Freq: Every day | RESPIRATORY_TRACT | 5 refills | Status: DC

## 2020-04-15 MED ORDER — ALBUTEROL SULFATE HFA 108 (90 BASE) MCG/ACT IN AERS: 2.0000 | INHALATION_SPRAY | RESPIRATORY_TRACT | 1 refills | Status: DC | PRN

## 2020-04-15 NOTE — Progress Notes (Signed)
Follow-up Note  RE: Monique Harrison MRN: [id number] DOB: 11/19/1975 Date of Office Visit: 04/15/2020   History of present illness: Monique Harrison is a 44 y.o. female presenting today for follow-up of severe persistent asthma, allergic rhinitis with conjunctivitis with recurrent sinusitis.  She was last seen in the office on 09/27/2019 by myself.  At that visit recommended she have a sinus CT scan performed as well as lab work including immunocompetence work-up.  Her sinus CT showed significant thickening of the maxillary, sphenoid and ethmoid sinuses without active infection.  Her immunocompetent work-up showed very poor protective titers to strep pneumo strains as well as diphtheria.  At that visit I did treat her for sinus infection with Augmentin and then transition her to azithromycin 3 days a week for a 4-week course.  She states while she was on the azithromycin she did not have any worsening of her sinus symptoms.  She states however since her sinus symptoms have been much worse.  Nothing is helping.  She has constant nasal congestion and drainage and she is blowing her nose all throughout the day including 2 nosebleeds.  She states the sinus rinse with budesonide are not effective and were expensive thus but she stopped these she is just doing sinus rinses with saline as well as saline spray.  She also states the Allegra is no longer effective.  She has tried Claritin, Zyrtec and Xyzal as well without much success.  She also states this week she had an asthma flare where she was having shortness of breath and wheezing to the point where she felt like she needed to go to an urgent care or ED but she use her nebulizer and was able to control her symptoms.  She states is the first time she has had a flareup like that in the past 2 months.  She states her albuterol use have been very infrequent.  She continues on Trelegy 1 puff once a day.  She stopped Dupixent as she states the injections were becoming  very painful and she was not seeing an improvement in her symptoms.  She has been on other biologic agents including Fasenra.  Singulair is also not been helpful in the past with allergy for asthma symptom control.  She states her biggest issue right now are her sinus control.  She has not seen ENT yet.    Review of systems: Review of Systems  Constitutional: Negative.   HENT: Positive for congestion, nosebleeds and sinus pain.   Eyes: Negative.   Respiratory: Positive for cough, shortness of breath and wheezing.   Cardiovascular: Negative.   Gastrointestinal: Negative.   Musculoskeletal: Negative.   Skin: Negative.   Neurological: Negative.     All other systems negative unless noted above in HPI  Past medical/social/surgical/family history have been reviewed and are unchanged unless specifically indicated below.  No changes  Medication List: Current Outpatient Medications  Medication Sig Dispense Refill  . albuterol (PROVENTIL) (2.5 MG/3ML) 0.083% nebulizer solution Can use one vial in nebulizer every four to six hours as needed for cough/wheeze/shortness of breath/chest tightness. 75 mL 1  . Cholecalciferol (VITAMIN D3) 3000 units TABS Take 3,000 Units by mouth daily.    Marland Kitchen Fexofenadine-Pseudoephedrine (ALLEGRA-D PO) Take by mouth.    Marland Kitchen olopatadine (PATANOL) 0.1 % ophthalmic solution Place 1 drop into both eyes 2 (two) times daily as needed for allergies (itchy eyes). 5 mL 3  . albuterol (VENTOLIN HFA) 108 (90 Base) MCG/ACT inhaler Inhale 2 puffs  into the lungs every 4 (four) hours as needed. 18 g 1  . Carbinoxamine Maleate (RYVENT) 6 MG TABS Take 1 tablet by mouth 2 (two) times daily. 60 tablet 5  . Fluticasone-Umeclidin-Vilant (TRELEGY ELLIPTA) 200-62.5-25 MCG/INH AEPB Inhale 1 puff into the lungs daily. 60 each 5  . ipratropium (ATROVENT) 0.06 % nasal spray Place 2 sprays into both nostrils 2 (two) times daily. 15 mL 5   Current Facility-Administered Medications  Medication  Dose Route Frequency Provider Last Rate Last Admin  . dupilumab (DUPIXENT) prefilled syringe 300 mg  300 mg Subcutaneous Q14 Days Kennith Gain, MD   300 mg at 10/30/19 1745     Known medication allergies: Allergies  Allergen Reactions  . Aspirin Shortness Of Breath    Pt tolerates aspirin for occasional use.     Physical examination: Blood pressure 108/72, pulse 86, resp. rate 16, SpO2 98 %.  General: Alert, interactive, in no acute distress. HEENT: PERRLA, TMs pearly gray, turbinates markedly edematous and pale with thick discharge, post-pharynx non erythematous. Neck: Supple without lymphadenopathy. Lungs: Clear to auscultation without wheezing, rhonchi or rales. {no increased work of breathing. CV: Normal S1, S2 without murmurs. Abdomen: Nondistended, nontender. Skin: Warm and dry, without lesions or rashes. Extremities:  No clubbing, cyanosis or edema. Neuro:   Grossly intact.  Diagnositics/Labs: Component     Latest Ref Rng & Units 09/27/2019  Pneumo Ab Type 1*     >1.3 ug/mL 0.4 (L)  Pneumo Ab Type 3*     >1.3 ug/mL 2.4  Pneumo Ab Type 4*     >1.3 ug/mL 0.2 (L)  Pneumo Ab Type 8*     >1.3 ug/mL 0.1 (L)  Pneumo Ab Type 9 (9N)*     >1.3 ug/mL 0.9 (L)  Pneumo Ab Type 12 (72F)*     >1.3 ug/mL 0.3 (L)  Pneumo Ab Type 14*     >1.3 ug/mL 2.7  Pneumo Ab Type 17 (578F)*     >1.3 ug/mL 0.3 (L)  Pneumo Ab Type 19 (73F)*     >1.3 ug/mL 1.2 (L)  Pneumo Ab Type 2*     >1.3 ug/mL 0.4 (L)  Pneumo Ab Type 20*     >1.3 ug/mL 1.0 (L)  Pneumo Ab Type 22 (75F)*     >1.3 ug/mL 0.3 (L)  Pneumo Ab Type 23 (48F)*     >1.3 ug/mL 0.9 (L)  Pneumo Ab Type 26 (6B)*     >1.3 ug/mL 1.5  Pneumo Ab Type 34 (10A)*     >1.3 ug/mL 0.2 (L)  Pneumo Ab Type 43 (11A)*     >1.3 ug/mL 1.2 (L)  Pneumo Ab Type 5*     >1.3 ug/mL 0.1 (L)  Pneumo Ab Type 51 (78F)*     >1.3 ug/mL 0.5 (L)  Pneumo Ab Type 54 (15B)*     >1.3 ug/mL 2.9  Pneumo Ab Type 56 (18C)*     >1.3 ug/mL 0.8 (L)   Pneumo Ab Type 57 (19A)*     >1.3 ug/mL 1.8  Pneumo Ab Type 68 (9V)*     >1.3 ug/mL 0.9 (L)  Pneumo Ab Type 70 (74F)*     >1.3 ug/mL 0.5 (L)  WBC     3.4 - 10.8 x10E3/uL 9.3  RBC     3.77 - 5.28 x10E6/uL 4.79  Hemoglobin     11.1 - 15.9 g/dL 13.9  HCT     34.0 - 46.6 % 42.9  MCV     79 -  97 fL 90  MCH     26.6 - 33.0 pg 29.0  MCHC     31.5 - 35.7 g/dL 32.4  RDW     11.7 - 15.4 % 12.7  Neutrophils     Not Estab. % 64  Lymphs     Not Estab. % 25  Monocytes     Not Estab. % 5  Eos     Not Estab. % 5  Basos     Not Estab. % 1  NEUT#     1.4 - 7.0 x10E3/uL 5.9  Lymphocyte #     0.7 - 3.1 x10E3/uL 2.3  Monocytes Absolute     0.1 - 0.9 x10E3/uL 0.5  EOS (ABSOLUTE)     0.0 - 0.4 x10E3/uL 0.5 (H)  Basophils Absolute     0.0 - 0.2 x10E3/uL 0.1  Immature Granulocytes     Not Estab. % 0  Immature Grans (Abs)     0.0 - 0.1 x10E3/uL 0.0  IgG (Immunoglobin G), Serum     586 - 1,602 mg/dL 1,558  IgA/Immunoglobulin A, Serum     87 - 352 mg/dL 347  IgM (Immunoglobulin M), Srm     26 - 217 mg/dL 297 (H)  Tetanus Ab, IgG     <0.10 IU/mL 0.60  Diphtheria Ab     <0.10 IU/mL <0.10 (L)  IgE (Immunoglobulin E), Serum     6 - 495 IU/mL 39  Compl, Total (CH50)     >41 U/mL >60    Sinus CT wo contrast from 10/19/19 -   IMPRESSION: 1. Moderate to marked severity bilateral ethmoid sinus and right maxillary sinus mucosal thickening. 2. Mild to moderate severity sphenoid sinus and left maxillary sinus mucosal thickening.  Assessment and plan:   Asthma, severe persistent     - continue Trelegy (triple medication therapy) 1 puff once a day.         - have access to albuterol inhaler 2 puffs or nebulizer 1 vial every 4-6 hours as needed for cough/wheeze/shortness of breath/chest tightness.  May use 15-20 minutes prior to activity.   Monitor frequency of use.      - Dupixent injection stopped due to pain at the site and becoming ineffective.  Last FDA approved asthma biologic  medication that you have not tried at this time is Cinqair, a monthly infusion that is weight-based dosing.  Monitor asthma symptoms and if you are having more frequent symptoms involving albuterol use, prednisone use for ED or urgent care visits then would want to initiate Cinqair sooner than later.      Asthma control goals:   Full participation in all desired activities (may need albuterol before activity)  Albuterol use two time or less a week on average (not counting use with activity)  Cough interfering with sleep two time or less a month  Oral steroids no more than once a year  No hospitalizations  Allergic rhinitis with conjunctivitis    - continue avoidance measures for dust mites, cat, dog, grasses, trees, weeds    - allegra no longer working.  We have tried the long-acting daily dose antihistamines.  We will have you try RyVent which is a twice a day dosed antihistamine take 1 tablet twice a day.  RyVent is a prescription based antihistamine.     - saline rinses    - will have her try nasal Atrovent 2 sprays each nostril twice a day at this time.  May use up to 4  times a day as needed for nasal drainage control    - thus far nasal steroid sprays including XHANCE and nasal steroid rinses have been ineffective  Recurrent sinusitis with poor antibody titers  -Immunocompetence work-up showed normal immunoglobulin levels however you have poor protective titers to strep pneumo and diphtheria.  We recommend that you received Pneumovax and diphtheria booster vaccines which you have done.  Will obtain repeat titers to ensure you have had an appropriate response.  If you do not have an appropriate response then that would indicate specific antibody deficiency and you may benefit from immunotherapy globulin replacement therapy.  - sinus CT showed significant thickening of the lining of the sinuses (maxillary, sphenoid and ethmoid) which can be seen in chronic sinus disease  - will place an  ENT referral to get in urgently for evaluation  Follow-up 3 months or sooner if needed   I appreciate the opportunity to take part in [facility] care. Please do not hesitate to contact me with questions.  Sincerely,   Monique Feeler, MD Allergy/Immunology [facility]

## 2020-04-15 NOTE — Telephone Encounter (Signed)
Patient needs referral to ENT Dr. Benjamine Mola for chronic sinusitis and CT sinus changes. Thank you.

## 2020-04-15 NOTE — Patient Instructions (Addendum)
Asthma     - continue Trelegy (triple medication therapy) 1 puff once a day.         - have access to albuterol inhaler 2 puffs or nebulizer 1 vial every 4-6 hours as needed for cough/wheeze/shortness of breath/chest tightness.  May use 15-20 minutes prior to activity.   Monitor frequency of use.      - Dupixent injection stopped due to pain at the site and becoming ineffective.  Last FDA approved asthma biologic medication that you have not tried at this time is Cinqair, a monthly infusion that is weight-based dosing.  Monitor asthma symptoms and if you are having more frequent symptoms involving albuterol use, prednisone use for ED or urgent care visits then would want to initiate Cinqair sooner than later.      Asthma control goals:   Full participation in all desired activities (may need albuterol before activity)  Albuterol use two time or less a week on average (not counting use with activity)  Cough interfering with sleep two time or less a month  Oral steroids no more than once a year  No hospitalizations  Allergic rhinitis with conjunctivitis    - continue avoidance measures for dust mites, cat, dog, grasses, trees, weeds    - allegra no longer working.  We have tried the long-acting daily dose antihistamines.  We will have you try RyVent which is a twice a day dosed antihistamine take 1 tablet twice a day.  RyVent is a prescription based antihistamine.     - saline rinses    - will have her try nasal Atrovent 2 sprays each nostril twice a day at this time.  May use up to 4 times a day as needed for nasal drainage control    - thus far nasal steroid sprays including XHANCE and nasal steroid rinses have been ineffective  Recurrent sinusitis  -Immunocompetence work-up showed normal immunoglobulin levels however you have poor protective titers to strep pneumo and diphtheria.  We recommend that you received Pneumovax and diphtheria booster vaccines which you have done.  Will obtain  repeat titers to ensure you have had an appropriate response.  If you do not have an appropriate response then that would indicate specific antibody deficiency and you may benefit from immunotherapy globulin replacement therapy.  - sinus CT showed significant thickening of the lining of the sinuses (maxillary, sphenoid and ethmoid) which can be seen in chronic sinus disease  - will place an ENT referral to get in urgently for evaluation  Follow-up 3 months or sooner if needed

## 2020-04-18 NOTE — Telephone Encounter (Signed)
Referral placed to Dr Benjamine Mola. I tried to call their office to schedule but they close early on Fridays.  I will give their office a call back on Monday.   I tried calling the patient but her voicemail is full also.   I will send her mychart message.

## 2020-10-20 ENCOUNTER — Other Ambulatory Visit: Payer: Self-pay

## 2020-10-20 ENCOUNTER — Encounter (HOSPITAL_COMMUNITY): Payer: Self-pay | Admitting: *Deleted

## 2020-10-20 ENCOUNTER — Emergency Department (HOSPITAL_COMMUNITY)
Admission: EM | Admit: 2020-10-20 | Discharge: 2020-10-20 | Disposition: A | Discharge status: Home / Self Care | Payer: Managed Care, Other (non HMO) | Attending: Emergency Medicine | Admitting: Emergency Medicine

## 2020-10-20 ENCOUNTER — Emergency Department (HOSPITAL_COMMUNITY): Payer: Managed Care, Other (non HMO)

## 2020-10-20 DIAGNOSIS — Z20822 Contact with and (suspected) exposure to covid-19: Secondary | ICD-10-CM | POA: Diagnosis not present

## 2020-10-20 DIAGNOSIS — J0100 Acute maxillary sinusitis, unspecified: Secondary | ICD-10-CM

## 2020-10-20 DIAGNOSIS — J3489 Other specified disorders of nose and nasal sinuses: Secondary | ICD-10-CM | POA: Diagnosis present

## 2020-10-20 DIAGNOSIS — H6122 Impacted cerumen, left ear: Secondary | ICD-10-CM | POA: Diagnosis not present

## 2020-10-20 DIAGNOSIS — J4541 Moderate persistent asthma with (acute) exacerbation: Secondary | ICD-10-CM | POA: Diagnosis not present

## 2020-10-20 LAB — RESP PANEL BY RT-PCR (FLU A&B, COVID) ARPGX2
Influenza A by PCR: NEGATIVE
Influenza B by PCR: NEGATIVE
SARS Coronavirus 2 by RT PCR: NEGATIVE

## 2020-10-20 LAB — GROUP A STREP BY PCR: Group A Strep by PCR: NOT DETECTED

## 2020-10-20 MED ORDER — IPRATROPIUM-ALBUTEROL 0.5-2.5 (3) MG/3ML IN SOLN
3.0000 mL | Freq: Once | RESPIRATORY_TRACT | Status: AC
  Administered 2020-10-20: 3 mL via RESPIRATORY_TRACT
  Filled 2020-10-20: qty 3

## 2020-10-20 MED ORDER — ALBUTEROL SULFATE (2.5 MG/3ML) 0.083% IN NEBU: INHALATION_SOLUTION | RESPIRATORY_TRACT | 1 refills | Status: DC

## 2020-10-20 MED ORDER — IPRATROPIUM-ALBUTEROL 0.5-2.5 (3) MG/3ML IN SOLN: 3.0000 mL | RESPIRATORY_TRACT | 0 refills | Status: DC | PRN

## 2020-10-20 MED ORDER — PREDNISONE 20 MG PO TABS
60.0000 mg | ORAL_TABLET | Freq: Once | ORAL | Status: AC
  Administered 2020-10-20: 60 mg via ORAL
  Filled 2020-10-20: qty 3

## 2020-10-20 MED ORDER — PREDNISONE 50 MG PO TABS: 50.0000 mg | ORAL_TABLET | Freq: Every day | ORAL | 0 refills | Status: AC

## 2020-10-20 MED ORDER — BENZONATATE 100 MG PO CAPS: 100.0000 mg | ORAL_CAPSULE | Freq: Three times a day (TID) | ORAL | 0 refills | Status: DC

## 2020-10-20 MED ORDER — CARBAMIDE PEROXIDE 6.5 % OT SOLN
5.0000 [drp] | Freq: Once | OTIC | Status: DC
  Filled 2020-10-20: qty 15

## 2020-10-20 MED ORDER — ALBUTEROL SULFATE HFA 108 (90 BASE) MCG/ACT IN AERS: 2.0000 | INHALATION_SPRAY | RESPIRATORY_TRACT | 1 refills | Status: DC | PRN

## 2020-10-20 MED ORDER — AMOXICILLIN 500 MG PO CAPS: 500.0000 mg | ORAL_CAPSULE | Freq: Three times a day (TID) | ORAL | 0 refills | Status: DC

## 2020-10-20 MED ORDER — CARBAMIDE PEROXIDE 6.5 % OT SOLN: 5.0000 [drp] | Freq: Two times a day (BID) | OTIC | 0 refills | Status: DC

## 2020-10-20 NOTE — Discharge Instructions (Addendum)
Take the medications as prescribed  Return for new or worsening symptoms 

## 2020-10-20 NOTE — ED Provider Notes (Signed)
[facility] EMERGENCY DEPARTMENT Provider Note   CSN: [id number] Arrival date & time: 10/20/20  0848    History Chief Complaint  Patient presents with   Sore Throat   Asthma    Monique Harrison is a 45 y.o. female with history significant for asthma with prior admission, no prior intubation who presents for evaluation of upper respiratory complaints.  She has had congestion and rhinorrhea over the last greater than 1 week.  Noted to have yellow purulent rhinorrhea.  Some tenderness to bilateral maxillary sinuses.  She feels like she has some stuffiness and pain to her left ear.  Also with sore throat.  She has had intermittent cough and shortness of breath at home and has been using her nebulizers without relief.  No chest pain, fever, chills, change in voice, abdominal pain, diarrhea, dysuria.  No known sick contacts.  Denies additional aggravating or alleviating factors.  Home COVID was negative  History obtained from patient and past medical records.  No interpreter used.  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   E coli bacteremia 02/10/2018   Rt Sided CAP (community acquired pneumonia) 02/10/2018   Cyst of spleen    Right upper quadrant abdominal tenderness without rebound tenderness    Elevated LFTs    Hemorrhagic gastritis 02/08/2018   Hypotension 02/07/2018   Gastritis with hemorrhage 02/07/2018   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     Family History  Problem Relation Age of Onset   Allergic rhinitis Father    Breast cancer Maternal Grandmother     Social History   Tobacco Use   Smoking status: Never   Smokeless tobacco: Never  Vaping Use   Vaping Use: Never used  Substance Use Topics   Alcohol use: No   Drug use: No    Home Medications Prior to Admission medications   Medication Sig Start Date End Date Taking? Authorizing Provider  amoxicillin (AMOXIL) 500 MG capsule Take 1 capsule (500 mg total) by mouth 3 (three) times daily. 10/20/20  Yes Cristalle Rohm A, PA-C  benzonatate (TESSALON) 100 MG capsule Take 1 capsule (100 mg total) by mouth every 8 (eight) hours. 10/20/20  Yes [employer] A, PA-C  carbamide peroxide (DEBROX) 6.5 % OTIC solution Place 5 drops into both ears 2 (two) times daily. 10/20/20  Yes Norelle Runnion A, PA-C  ipratropium-albuterol (DUONEB) 0.5-2.5 (3) MG/3ML SOLN Take 3 mLs by nebulization every 4 (four) hours as needed. 10/20/20  Yes Nansi Birmingham A, PA-C  predniSONE (DELTASONE) 50 MG tablet Take 1 tablet (50  mg total) by mouth daily for 5 days. 10/20/20 10/25/20 Yes Alika Saladin A, PA-C  albuterol (PROVENTIL) (2.5 MG/3ML) 0.083% nebulizer solution Can use one vial in nebulizer every four to six hours as needed for cough/wheeze/shortness of breath/chest tightness. 10/20/20   Chapel Silverthorn A, PA-C  albuterol (VENTOLIN HFA) 108 (90 Base) MCG/ACT inhaler Inhale 2 puffs into the lungs every 4 (four) hours as needed. 10/20/20   Coletta Lockner A, PA-C  Carbinoxamine Maleate (RYVENT) 6 MG TABS Take 1 tablet by mouth 2 (two) times  daily. 04/15/20   Kennith Gain, MD  Cholecalciferol (VITAMIN D3) 3000 units TABS Take 3,000 Units by mouth daily.    [provider]  Fexofenadine-Pseudoephedrine (ALLEGRA-D PO) Take by mouth.    [provider]  Fluticasone-Umeclidin-Vilant (TRELEGY ELLIPTA) 200-62.5-25 MCG/INH AEPB Inhale 1 puff into the lungs daily. 04/15/20   Kennith Gain, MD  ipratropium (ATROVENT) 0.06 % nasal spray Place 2 sprays into both nostrils 2 (two) times daily. 04/15/20   Kennith Gain, MD  olopatadine (PATANOL) 0.1 % ophthalmic solution Place 1 drop into both eyes 2 (two) times daily as needed for allergies (itchy eyes). 02/19/19   Garnet Sierras, DO    Allergies    Aspirin  Review of Systems   Review of Systems  Constitutional: Negative.   HENT:  Positive for congestion, ear pain, postnasal drip, rhinorrhea, sinus pressure, sinus pain and sore throat. Negative for trouble swallowing and voice change.   Respiratory:  Positive for cough and shortness of breath. Negative for choking, chest tightness, wheezing and stridor.   Cardiovascular: Negative.   Gastrointestinal: Negative.   Genitourinary: Negative.   Musculoskeletal: Negative.   Neurological: Negative.   All other systems reviewed and are negative.  Physical Exam Updated Vital Signs BP 122/81 (BP Location: Right Arm)   Pulse 88   Temp 98.2 F (36.8 C) (Oral)   Resp 16   SpO2 96%   Physical Exam Vitals and nursing note reviewed.  Constitutional:      General: She is not in acute distress.    Appearance: She is well-developed. She is not ill-appearing, toxic-appearing or diaphoretic.  HENT:     Head: Normocephalic and atraumatic.     Jaw: There is normal jaw occlusion.     Comments: Diffuse tenderness to bilateral maxillary sinuses.    Right Ear: Tympanic membrane, ear canal and external ear normal.     Left Ear: There is impacted cerumen.     Ears:     Comments: Cerumen impaction left  ear    Mouth/Throat:     Lips: Pink.     Mouth: Mucous membranes are moist. No angioedema.     Palate: No mass and lesions.     Pharynx: Oropharynx is clear. Uvula midline.     Tonsils: No tonsillar exudate or tonsillar abscesses. 0 on the right. 0 on the left.     Comments: Posterior oropharynx mildly erythematous.  No evidence of PTA or RPA Eyes:     Pupils: Pupils are equal, round, and reactive to light.  Neck:     Trachea: Trachea and phonation normal.  Cardiovascular:     Rate and Rhythm: Normal rate.     Pulses: Normal pulses.          Radial pulses are 2+ on the right side and 2+ on the left side.     Heart sounds: Normal heart sounds.  Pulmonary:     Effort: Pulmonary effort is normal.  No respiratory distress.     Breath sounds: Wheezing present.     Comments: Diffuse expiratory wheeze bilaterally.  Mild mild decreased air movement to upper lobes Chest:     Comments: Equal rise and fall to chest wall, chest wall nontender Abdominal:     General: There is no distension.     Palpations: Abdomen is soft.     Tenderness: There is no abdominal tenderness.     Comments: Soft, nontender without rebound or guarding  Musculoskeletal:        General: Normal range of motion.     Cervical back: Full passive range of motion without pain, normal range of motion and neck supple.     Comments: Moves all 4 extremities at difficulty.  No bony tenderness.  Compartments soft  Skin:    General: Skin is warm and dry.  Neurological:     General: No focal deficit present.     Mental Status: She is alert.     Cranial Nerves: Cranial nerves are intact.     Sensory: Sensation is intact.     Motor: Motor function is intact.     Comments: Cranial nerves 3-12 grossly intact Ambulatory without difficulty  Psychiatric:        Mood and Affect: Mood normal.    ED Results / Procedures / Treatments   Labs (all labs ordered are listed, but only abnormal results are displayed) Labs Reviewed  RESP  PANEL BY RT-PCR (FLU A&B, COVID) [id number]  GROUP A STREP BY PCR    EKG None  Radiology DG Chest 2 View  Result Date: 10/20/2020 CLINICAL DATA:  Cough, wheezing and shortness of breath.  Asthma. EXAM: CHEST - 2 VIEW COMPARISON:  05/01/2018.  CT abdomen pelvis 02/06/2018. FINDINGS: Trachea is midline. Heart size normal. Minimal infrahilar subsegmental atelectasis bilaterally. No pleural fluid. Rounded calcification in the left upper quadrant corresponds to a peripherally calcified splenic cystic lesion on 02/06/2018. IMPRESSION: Minimal infrahilar subsegmental volume loss. Electronically Signed   By: Lorin Picket M.D.   On: 10/20/2020 10:08    Procedures .Ear Cerumen Removal  Date/Time: 10/20/2020 6:51 PM Performed by: Nettie Elm, PA-C Authorized by: Nettie Elm, PA-C   Consent:    Consent obtained:  Verbal   Consent given by:  Patient   Risks, benefits, and alternatives were discussed: yes     Risks discussed:  Bleeding, infection, pain, TM perforation, incomplete removal and dizziness   Alternatives discussed:  No treatment, delayed treatment, alternative treatment, observation and referral Universal protocol:    Procedure explained and questions answered to patient or proxy's satisfaction: yes     Relevant documents present and verified: yes     Test results available: yes     Imaging studies available: yes     Required blood products, implants, devices, and special equipment available: yes     Site/side marked: yes     Immediately prior to procedure, a time out was called: yes     Patient identity confirmed:  Verbally with patient Procedure details:    Location:  L ear   Procedure type: irrigation     Successful cerumen removal: Moderate cerumen remove, persistent cerumen in canal.   Post-procedure details:    Inspection:  Some cerumen remaining   Hearing quality:  Normal   Procedure completion:  Tolerated well, no immediate complications   Medications  Ordered in ED Medications  carbamide peroxide (DEBROX) 6.5 % OTIC (EAR) solution 5 drop (has no administration in  time range)  ipratropium-albuterol (DUONEB) 0.5-2.5 (3) MG/3ML nebulizer solution 3 mL (3 mLs Nebulization Given 10/20/20 1710)  predniSONE (DELTASONE) tablet 60 mg (60 mg Oral Given 10/20/20 1708)    ED Course  I have reviewed the triage vital signs and the nursing notes.  Pertinent labs & imaging results that were available during my care of the patient were reviewed by me and considered in my medical decision making (see chart for details).  45 year old here for evaluation of upper respiratory complaints.  She is afebrile, nonseptic, not ill-appearing.  Has diffuse coarse lung sounds bilaterally with moderate expiratory wheeze.  She does not appear in acute respiratory distress.  Posterior oropharynx clear.  No evidence of PTA or RPA.  Tolerating secretions.  Left ear with cerumen impaction, right TM clear.  Diffuse tenderness of bilateral maxillary sinuses.  Work-up started from triage today personally viewed and interpreted:  COVID, flu negative Strep negative Chest x-ray without infiltrates, cardiomegaly, pulm edema  We will perform cerumen impaction to left ear, steroids, DuoNeb for release  Patient reassessed.  Significant improvement in breathing with duo nebs and steroids.  Moderate cerumen removed in left canal.  There is some persistent cerumen.  Will DC home with some Debrox.  Given persistent sinus pain, purulent rhinorrhea congestion DC home with short course of antibiotics.  Refilled home asthma meds as she is out of these.  Has no chest pain, shortness of breath to suggest PE, dissection, ACS, or bacterial infectious process.  The patient has been appropriately medically screened and/or stabilized in the ED. I have low suspicion for any other emergent medical condition which would require further screening, evaluation or treatment in the ED or require  inpatient management.  Patient is hemodynamically stable and in no acute distress.  Patient able to ambulate in department prior to ED.  Evaluation does not show acute pathology that would require ongoing or additional emergent interventions while in the emergency department or further inpatient treatment.  I have discussed the diagnosis with the patient and answered all questions.  Pain is been managed while in the emergency department and patient has no further complaints prior to discharge.  Patient is comfortable with plan discussed in room and is stable for discharge at this time.  I have discussed strict return precautions for returning to the emergency department.  Patient was encouraged to follow-up with PCP/specialist refer to at discharge.     MDM Rules/Calculators/A&P                          Monique Harrison was evaluated in Emergency Department on 10/20/2020 for the symptoms described in the history of present illness. She was evaluated in the context of the global COVID-19 pandemic, which necessitated consideration that the patient might be at risk for infection with the SARS-CoV-2 virus that causes COVID-19. Institutional protocols and algorithms that pertain to the evaluation of patients at risk for COVID-19 are in a state of rapid change based on information released by regulatory bodies including the CDC and federal and state organizations. These policies and algorithms were followed during the patient's care in the ED.  Final Clinical Impression(s) / ED Diagnoses Final diagnoses:  Acute non-recurrent maxillary sinusitis  Moderate persistent asthma with exacerbation  Impacted cerumen of left ear    Rx / DC Orders ED Discharge Orders          Ordered    carbamide peroxide (DEBROX) 6.5 % OTIC solution  2  times daily        10/20/20 1849    predniSONE (DELTASONE) 50 MG tablet  Daily        10/20/20 1849    albuterol (PROVENTIL) (2.5 MG/3ML) 0.083% nebulizer solution         10/20/20 1849    albuterol (VENTOLIN HFA) 108 (90 Base) MCG/ACT inhaler  Every 4 hours PRN        10/20/20 1849    ipratropium-albuterol (DUONEB) 0.5-2.5 (3) MG/3ML SOLN  Every 4 hours PRN        10/20/20 1849    amoxicillin (AMOXIL) 500 MG capsule  3 times daily        10/20/20 1849    benzonatate (TESSALON) 100 MG capsule  Every 8 hours        10/20/20 1849             Adamary Savary A, PA-C 10/20/20 1854    Charlesetta Shanks, MD 10/29/20 [phone]

## 2020-10-20 NOTE — ED Triage Notes (Signed)
Pt reports sore throat since Friday. Denies fever. Having increase in asthma symptoms, had to use inhaler and nebulizer at home. Having left ear pain and congestion. Airway intact at triage.

## 2020-10-20 NOTE — ED Notes (Signed)
Patient transported to X-ray 

## 2020-10-20 NOTE — ED Provider Notes (Signed)
Emergency Medicine Provider Triage Evaluation Note  Monique Harrison , a 45 y.o. female  was evaluated in triage.  Pt complains of sore throat left ear pain, 3 days. Has ENT for bad sinus and allergy problems. Home COVID negative. No hot/cold/chills. Asthma worse, never on a ventilator, hospitalized once years ago for asthma.  Review of Systems  Positive: Ear ache, sore throat Negative: No vomiting, no diarrhea  Physical Exam  BP 118/81 (BP Location: Right Arm)   Pulse 99   Temp 98.1 F (36.7 C) (Oral)   Resp 18   SpO2 96%  Gen:   Awake, no distress   Resp:  Normal effort, wheeze throughout lung fields. MSK:   Moves extremities without difficulty  Other:  No peripheral edema or calf tenderness.  Medical Decision Making  Medically screening exam initiated at 9:13 AM.  Appropriate orders placed.  Monique Harrison was informed that the remainder of the evaluation will be completed by another provider, this initial triage assessment does not replace that evaluation, and the importance of remaining in the ED until their evaluation is complete.     Monique Shanks, MD 10/20/20 [phone]

## 2020-11-13 IMAGING — CT CT MAXILLOFACIAL W/O CM
3 of 5 series · 13 of 47 positions shown, 15 images · non-contrast
Comparison: None.

CLINICAL DATA: Recurrence sinusitis.

EXAM:
CT MAXILLOFACIAL WITHOUT CONTRAST
TECHNIQUE: Multidetector CT imaging of the maxillofacial structures was
performed. Multiplanar CT image reconstructions were also generated.

[Series 2: sinus 2.00 hr60 s3 axial · axial · 0.33mm/px · z∈[-666,-594]mm · 7 of 48 slices shown, 9 images]
[im 6/48  brain]
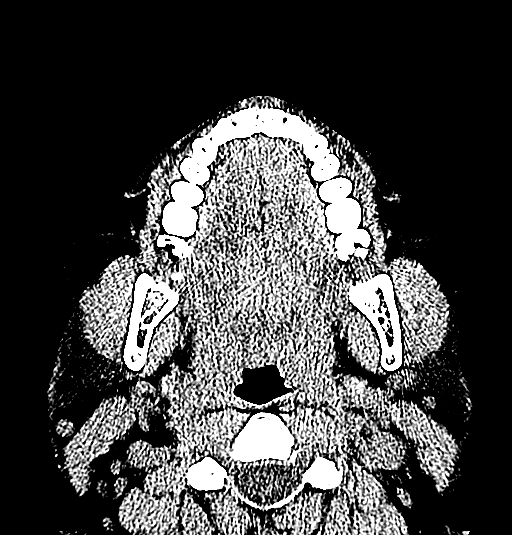
[im 6/48  bone]
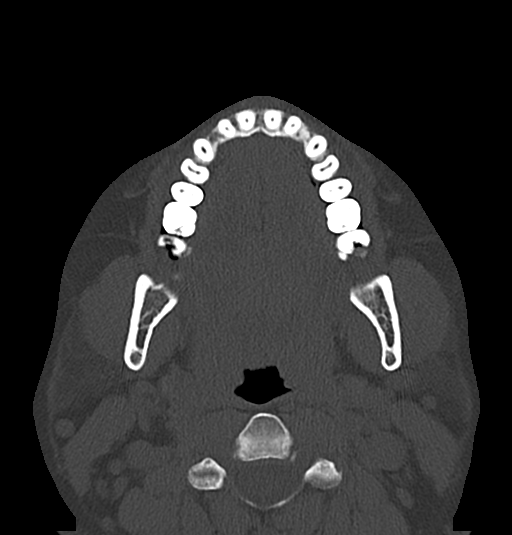
[im 11/48  bone]
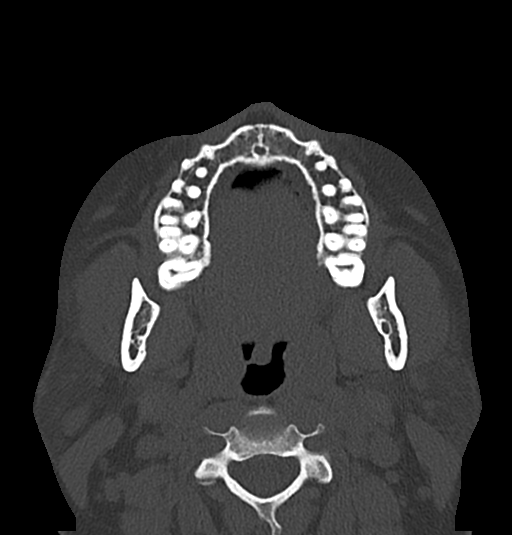
[im 16/48  bone]
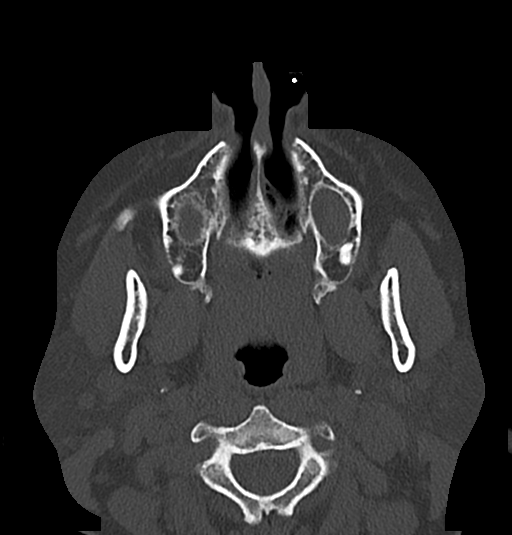
[im 27/48  bone]
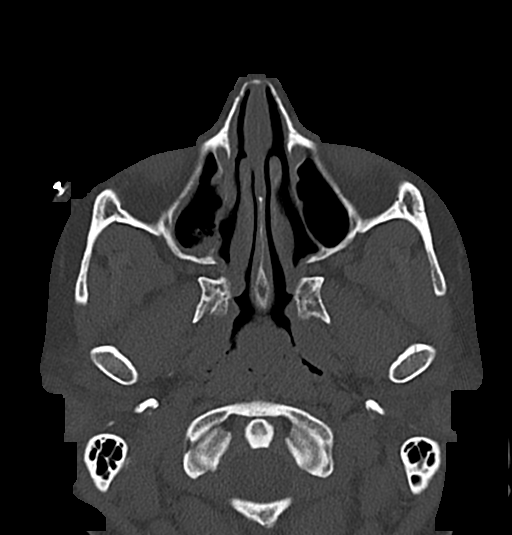
[im 32/48  brain]
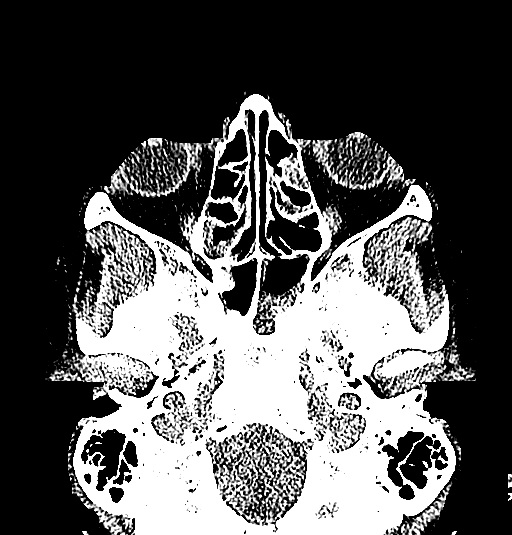
[im 32/48  bone]
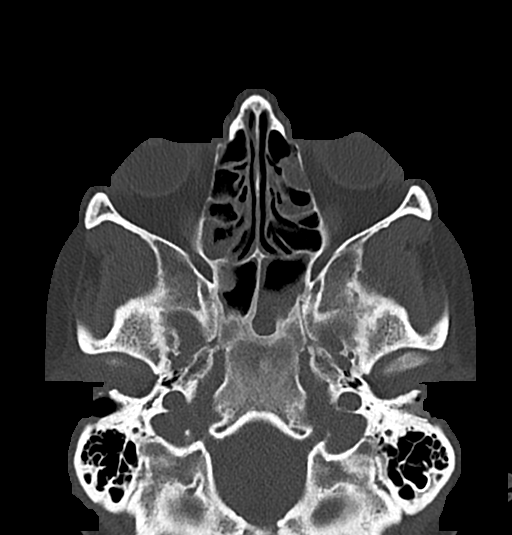
[im 37/48  bone]
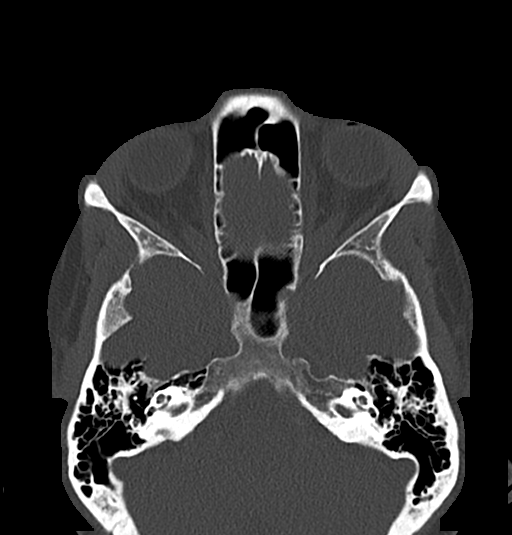
[im 42/48  bone]
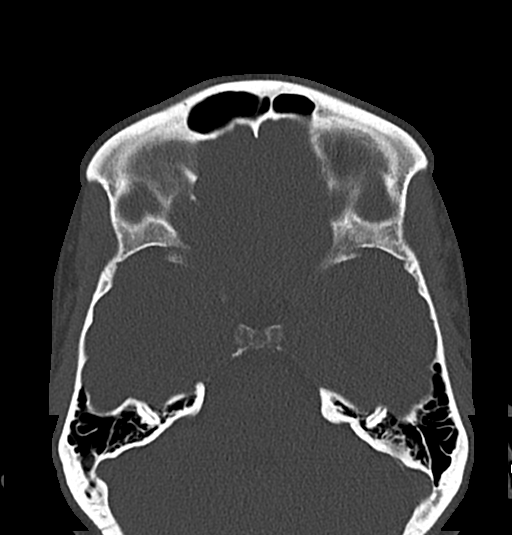

[Series 4: sinus 2.00 hr60 s3 cor · coronal · 0.19mm/px · 3 of 87 slices shown]
[im 29/87  bone]
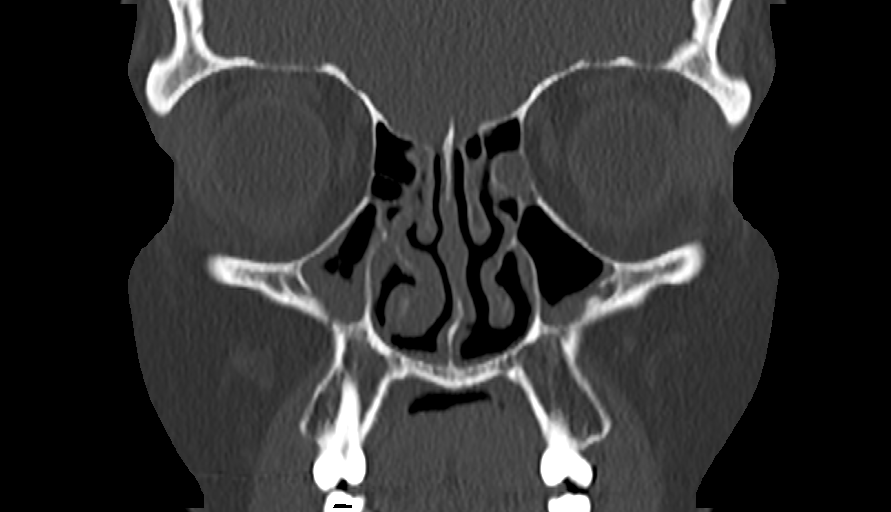
[im 39/87  bone]
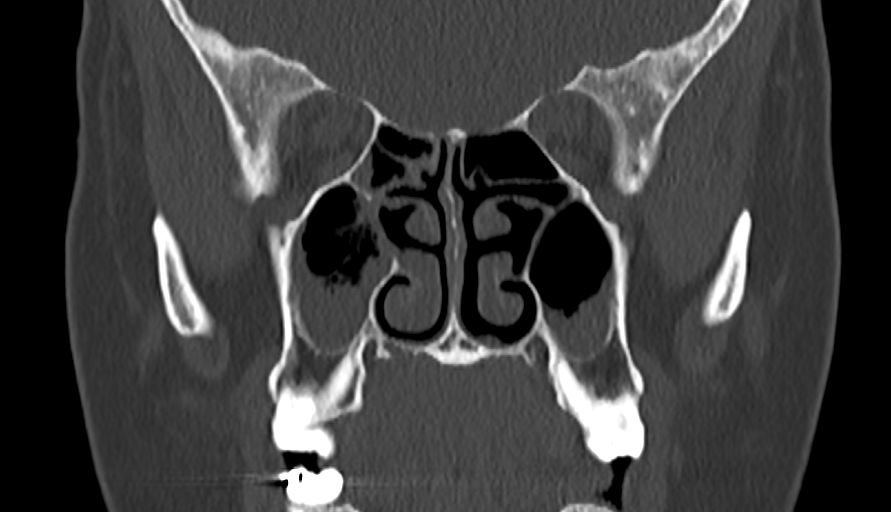
[im 48/87  bone]
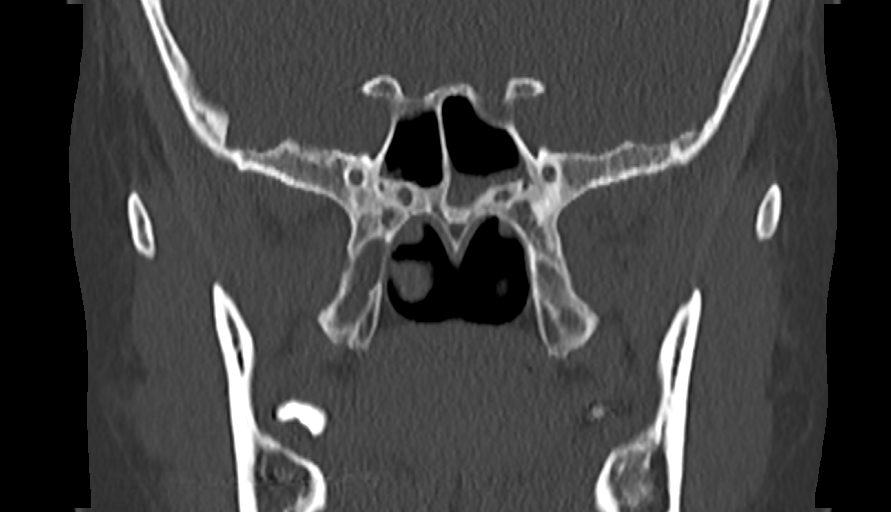

[Series 6: sinus 2.00 hr60 s3 sag · sagittal · 0.19mm/px · 3 of 82 slices shown]
[im 28/82  bone]
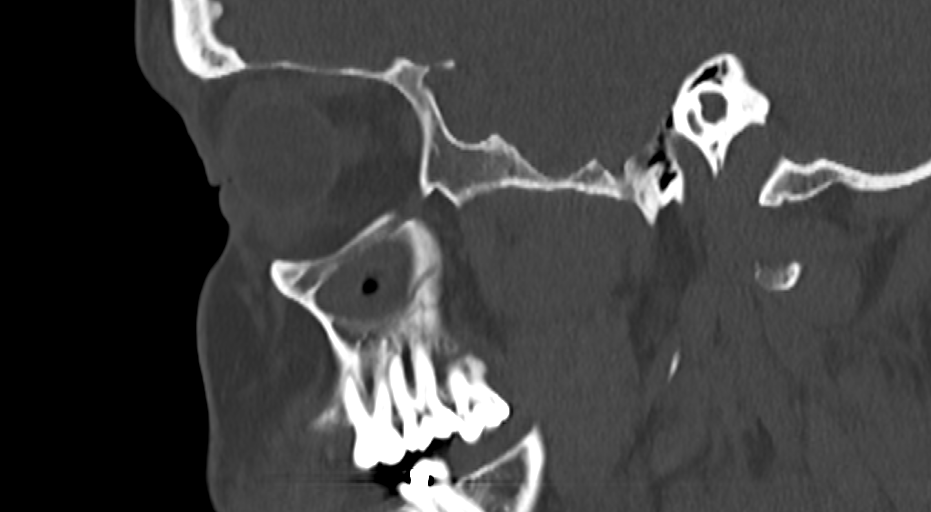
[im 41/82  bone]
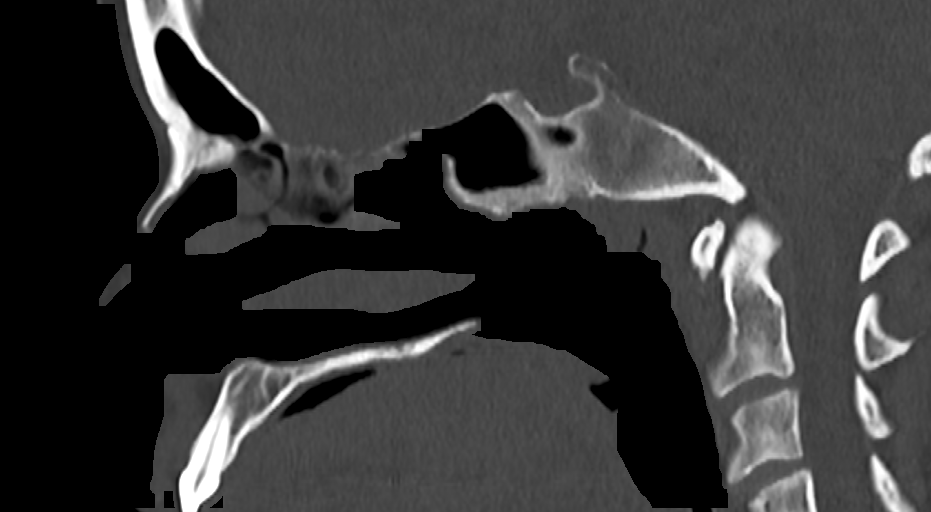
[im 55/82  bone]
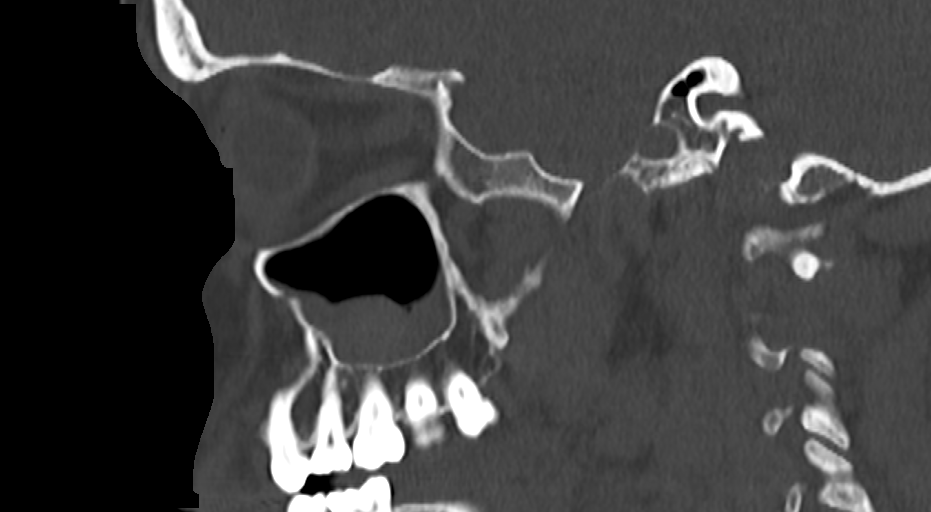

[13 of 47 positions shown; findings below may reference images not displayed]

FINDINGS: Osseous: No fracture or mandibular dislocation. No destructive
process.

Orbits: Negative. No traumatic or inflammatory finding.

Sinuses: Moderate to marked severity bilateral ethmoid sinus and
right maxillary sinus mucosal thickening is seen. Mild to moderate
severity sphenoid sinus and left maxillary sinus mucosal thickening
is also noted.

Soft tissues: Negative.

Limited intracranial: No significant or unexpected finding.
IMPRESSION: 1. Moderate to marked severity bilateral ethmoid sinus and right
maxillary sinus mucosal thickening.
2. Mild to moderate severity sphenoid sinus and left maxillary sinus
mucosal thickening.

## 2020-12-28 ENCOUNTER — Other Ambulatory Visit: Payer: Self-pay | Admitting: Allergy

## 2020-12-29 ENCOUNTER — Telehealth: Payer: Self-pay | Admitting: Allergy

## 2020-12-29 ENCOUNTER — Other Ambulatory Visit: Payer: Self-pay

## 2020-12-29 MED ORDER — TRELEGY ELLIPTA 200-62.5-25 MCG/INH IN AEPB: 1.0000 | INHALATION_SPRAY | Freq: Every day | RESPIRATORY_TRACT | 5 refills | Status: DC

## 2020-12-29 NOTE — Telephone Encounter (Signed)
Patient states she is going out of town and is needing a refill on Trelegy. I informed patient that she would need an OV scheduled which she made for 10/4 with Dr. Nelva Bush. She mentioned having a discount card sent in with prescription.    Best pharmacy- [facility] on [address] in [place]

## 2020-12-29 NOTE — Telephone Encounter (Signed)
Refill for Trelegy with discount card has been sent in to [facility] on [address]. Unable to inform patient. Voicemail is full

## 2021-02-01 ENCOUNTER — Other Ambulatory Visit: Payer: Self-pay | Admitting: Allergy

## 2021-02-02 NOTE — Telephone Encounter (Signed)
Pt. Needs ov. Pt. Has an appointment with Dr. Nelva Bush tomorrow. Will send in Trelegy Ellipta after pt. See's Dr. Nelva Bush.

## 2021-02-03 ENCOUNTER — Other Ambulatory Visit: Payer: Self-pay

## 2021-02-03 ENCOUNTER — Encounter: Payer: Self-pay | Admitting: Allergy

## 2021-02-03 ENCOUNTER — Ambulatory Visit: Payer: Managed Care, Other (non HMO) | Admitting: Allergy

## 2021-02-03 VITALS — BP 106/68 | HR 87 | Resp 18 | Ht 66.0 in | Wt 268.0 lb

## 2021-02-03 DIAGNOSIS — J4550 Severe persistent asthma, uncomplicated: Secondary | ICD-10-CM

## 2021-02-03 DIAGNOSIS — J329 Chronic sinusitis, unspecified: Secondary | ICD-10-CM | POA: Diagnosis not present

## 2021-02-03 DIAGNOSIS — J3089 Other allergic rhinitis: Secondary | ICD-10-CM

## 2021-02-03 DIAGNOSIS — H1013 Acute atopic conjunctivitis, bilateral: Secondary | ICD-10-CM

## 2021-02-03 DIAGNOSIS — R768 Other specified abnormal immunological findings in serum: Secondary | ICD-10-CM

## 2021-02-03 MED ORDER — RYCLORA 2 MG/5ML PO SOLN: 2.0000 mg | Freq: Four times a day (QID) | ORAL | 11 refills | Status: DC | PRN

## 2021-02-03 MED ORDER — ALBUTEROL SULFATE HFA 108 (90 BASE) MCG/ACT IN AERS: 2.0000 | INHALATION_SPRAY | RESPIRATORY_TRACT | 1 refills | Status: DC | PRN

## 2021-02-03 MED ORDER — TRELEGY ELLIPTA 200-62.5-25 MCG/INH IN AEPB: 1.0000 | INHALATION_SPRAY | Freq: Every day | RESPIRATORY_TRACT | 5 refills | Status: DC

## 2021-02-03 MED ORDER — BENZONATATE 100 MG PO CAPS: 100.0000 mg | ORAL_CAPSULE | Freq: Three times a day (TID) | ORAL | 1 refills | Status: DC

## 2021-02-03 NOTE — Progress Notes (Signed)
Follow-up Note  RE: Monique Harrison MRN: [id number] DOB: 1976/05/01 Date of Office Visit: 02/03/2021   History of present illness: Monique Harrison is a 45 y.o. female presenting today for follow-up of severe asthma, allergic rhinitis with conjunctivitis and recurrent sinusitis.  She was last seen in the office on 04/15/20 by myself.   She saw Dr. Benjamine Mola, with ENT, who she states recommended to use 2 nasal sprays.  She states they are helpful but they don't last long.  She states if she uses the nasal sprays first thing in the morning she gets to about 8pm before She is still blowing her nose a lot but does feel like her runny nose is improved.  She also reports having continued chest congestion.  She has been taking Allegra-D lately to help with the congestion that she is having. She states however her asthma seemingly is been doing better.  She is on Trelegy 1 puff DAILY.  She believes she has used her albuterol maybe 5 times this entire year.  She reports she did use her nebulizer when she had an upper respiratory illness.  Otherwise she denies any ED or urgent care visits or systemic steroid needs.  She also has [facility] for as needed use. Fortunately she has not required any antibiotics this year.   Review of systems: Review of Systems  Constitutional: Negative.   HENT:  Positive for congestion.   Eyes: Negative.   Respiratory: Negative.    Cardiovascular: Negative.   Gastrointestinal: Negative.   Musculoskeletal: Negative.   Skin: Negative.   Neurological: Negative.    All other systems negative unless noted above in HPI  Past medical/social/surgical/family history have been reviewed and are unchanged unless specifically indicated below.  No changes  Medication List: Current Outpatient Medications  Medication Sig Dispense Refill   albuterol (PROVENTIL) (2.5 MG/3ML) 0.083% nebulizer solution Can use one vial in nebulizer every four to six hours as needed for  cough/wheeze/shortness of breath/chest tightness. 75 mL 1   Ascorbic Acid (VITAMIN C PO) Take by mouth.     Azelastine-Fluticasone 137-50 MCG/ACT SUSP Place 1 spray into both nostrils 2 (two) times daily.     Cetirizine-Pseudoephedrine (ZYRTEC-D PO) Take by mouth.     Cholecalciferol (VITAMIN D3) 3000 units TABS Take 3,000 Units by mouth daily.     Cholecalciferol 25 MCG (1000 UT) tablet Take by mouth.     Cyanocobalamin (B-12 PO) Take by mouth.     Dexchlorpheniramine Maleate (RYCLORA) 2 MG/5ML SOLN Take 2 mg by mouth every 6 (six) hours as needed. 280 mL 11   Fexofenadine-Pseudoephedrine (ALLEGRA-D PO) Take by mouth.     ipratropium-albuterol (DUONEB) 0.5-2.5 (3) MG/3ML SOLN Take 3 mLs by nebulization every 4 (four) hours as needed. 360 mL 0   olopatadine (PATANOL) 0.1 % ophthalmic solution Place 1 drop into both eyes 2 (two) times daily as needed for allergies (itchy eyes). 5 mL 3   ursodiol (ACTIGALL) 300 MG capsule Take 300 mg by mouth 2 (two) times daily.     albuterol (VENTOLIN HFA) 108 (90 Base) MCG/ACT inhaler Inhale 2 puffs into the lungs every 4 (four) hours as needed. 18 g 1   benzonatate (TESSALON) 100 MG capsule Take 1 capsule (100 mg total) by mouth every 8 (eight) hours. 30 capsule 1   Fluticasone-Umeclidin-Vilant (TRELEGY ELLIPTA) 200-62.5-25 MCG/INH AEPB Inhale 1 puff into the lungs daily. 60 each 5   No current facility-administered medications for this visit.     Known  medication allergies: Allergies  Allergen Reactions   Aspirin Shortness Of Breath    Pt tolerates aspirin for occasional use.     Physical examination: Blood pressure 106/68, pulse 87, resp. rate 18, height 5\' 6"  (1.676 m), weight 268 lb (121.6 kg), SpO2 97 %.  General: Alert, interactive, in no acute distress. HEENT: PERRLA, TMs pearly gray, turbinates moderately edematous with thick discharge, post-pharynx non erythematous. Neck: Supple without lymphadenopathy. Lungs: Mildly decreased breath  sounds bilaterally without wheezing, rhonchi or rales. {no increased work of breathing. CV: Normal S1, S2 without murmurs. Abdomen: Nondistended, nontender. Skin: Warm and dry, without lesions or rashes. Extremities:  No clubbing, cyanosis or edema. Neuro:   Grossly intact.  Diagnositics/Labs:  Spirometry: FEV1: 1.51 L or 57%, FVC: 1.95 L 60%, ratio consistent with restrictive pattern however this is stable   Assessment and plan: Asthma, severe persistent     - continue Trelegy (triple medication therapy) 1 puff once a day.         - have access to albuterol inhaler 2 puffs or nebulizer 1 vial every 4-6 hours as needed for cough/wheeze/shortness of breath/chest tightness.  May use 15-20 minutes prior to activity.   Monitor frequency of use.      - discussed today if Trelegy does not control your asthma symptoms then step-up options now including Cinqair infusions or Tezspire injections both of which are done monthly     - at this time symptoms are under control     - lung function testing is stable      Asthma control goals:  Full participation in all desired activities (may need albuterol before activity) Albuterol use two time or less a week on average (not counting use with activity) Cough interfering with sleep two time or less a month Oral steroids no more than once a year No hospitalizations  Allergic rhinitis with conjunctivitis    - continue avoidance measures for dust mites, cat, dog, grasses, trees, weeds    - will try another prescription based antihistamine, Ryclora 2mg  every 6 hours as needed    - continue saline rinses    - continue nasal sprays prescribed by Dr. Benjamine Mola.  Let me know which ones you are taking as you may be able to use either more than once a day    - follow-up with Dr. Benjamine Mola  Recurrent sinusitis  -Immunocompetence work-up showed normal immunoglobulin levels however you have poor protective titers to strep pneumo and diphtheria.  You did receive Pneumovax  and diphtheria booster vaccines.  Will obtain repeat titers to ensure you have had an appropriate response.  If you do not have an appropriate response then that would indicate specific antibody deficiency and you may benefit from immunotherapy globulin replacement therapy.  - sinus CT showed significant thickening of the lining of the sinuses (maxillary, sphenoid and ethmoid) which can be seen in chronic sinus disease  Follow-up 3 months or sooner if needed  I appreciate the opportunity to take part in [place] care. Please do not hesitate to contact me with questions.  Sincerely,   Prudy Feeler, MD Allergy/Immunology [facility] [facility]

## 2021-02-03 NOTE — Patient Instructions (Addendum)
Asthma     - continue Trelegy (triple medication therapy) 1 puff once a day.         - have access to albuterol inhaler 2 puffs or nebulizer 1 vial every 4-6 hours as needed for cough/wheeze/shortness of breath/chest tightness.  May use 15-20 minutes prior to activity.   Monitor frequency of use.      - discussed today if Trelegy does not control your asthma symptoms then step-up options now including Cinqair infusions or Tezspire injections both of which are done monthly     - at this time symptoms are under control     - lung function testing is stable      Asthma control goals:  Full participation in all desired activities (may need albuterol before activity) Albuterol use two time or less a week on average (not counting use with activity) Cough interfering with sleep two time or less a month Oral steroids no more than once a year No hospitalizations  Allergic rhinitis with conjunctivitis    - continue avoidance measures for dust mites, cat, dog, grasses, trees, weeds    - will try another prescription based antihistamine, Ryclora 2mg  every 6 hours as needed    - continue saline rinses    - continue nasal sprays prescribed by Dr. Benjamine Mola.  Let me know which ones you are taking as you may be able to use either more than once a day    - follow-up with Dr. Benjamine Mola  Recurrent sinusitis  -Immunocompetence work-up showed normal immunoglobulin levels however you have poor protective titers to strep pneumo and diphtheria.  You did receive Pneumovax and diphtheria booster vaccines.  Will obtain repeat titers to ensure you have had an appropriate response.  If you do not have an appropriate response then that would indicate specific antibody deficiency and you may benefit from immunotherapy globulin replacement therapy.  - sinus CT showed significant thickening of the lining of the sinuses (maxillary, sphenoid and ethmoid) which can be seen in chronic sinus disease  Follow-up 3 months or sooner if  needed

## 2021-02-16 ENCOUNTER — Telehealth: Payer: Self-pay | Admitting: *Deleted

## 2021-02-16 NOTE — Telephone Encounter (Signed)
PA submitted for RyClora 2mg /37mL solution.  Your information has been submitted to [facility]. To check for an updated outcome later, reopen this PA request from your dashboard.  If [employer] has not responded to your request within 24 hours, contact [place] at [phone]. If you think there may be a problem with your PA request, use our live chat feature at the bottom right.

## 2021-02-16 NOTE — Telephone Encounter (Signed)
Approved today for Ryclora through 02/16/22. Faxed approval letter to pharmacy.

## 2021-02-24 LAB — STREP PNEUMONIAE 23 SEROTYPES IGG
Pneumo Ab Type 1*: 0.8 ug/mL — ABNORMAL LOW (ref >1.3)
Pneumo Ab Type 12 (12F)*: 0.1 ug/mL — ABNORMAL LOW (ref >1.3)
Pneumo Ab Type 14*: >18.7 ug/mL (ref >1.3)
Pneumo Ab Type 17 (17F)*: 4.7 ug/mL (ref >1.3)
Pneumo Ab Type 19 (19F)*: 2.5 ug/mL (ref >1.3)
Pneumo Ab Type 2*: 0.8 ug/mL — ABNORMAL LOW (ref >1.3)
Pneumo Ab Type 20*: 2.3 ug/mL (ref >1.3)
Pneumo Ab Type 22 (22F)*: 0.9 ug/mL — ABNORMAL LOW (ref >1.3)
Pneumo Ab Type 23 (23F)*: 0.7 ug/mL — ABNORMAL LOW (ref >1.3)
Pneumo Ab Type 26 (6B)*: 3.7 ug/mL (ref >1.3)
Pneumo Ab Type 3*: 2.2 ug/mL (ref >1.3)
Pneumo Ab Type 34 (10A)*: 0.3 ug/mL — ABNORMAL LOW (ref >1.3)
Pneumo Ab Type 4*: 0.2 ug/mL — ABNORMAL LOW (ref >1.3)
Pneumo Ab Type 43 (11A)*: 1 ug/mL — ABNORMAL LOW (ref >1.3)
Pneumo Ab Type 5*: 1.1 ug/mL — ABNORMAL LOW (ref >1.3)
Pneumo Ab Type 51 (7F)*: 2.8 ug/mL (ref >1.3)
Pneumo Ab Type 54 (15B)*: >22 ug/mL (ref >1.3)
Pneumo Ab Type 56 (18C)*: 1.5 ug/mL (ref >1.3)
Pneumo Ab Type 57 (19A)*: 3.1 ug/mL (ref >1.3)
Pneumo Ab Type 68 (9V)*: 1.7 ug/mL (ref >1.3)
Pneumo Ab Type 70 (33F)*: 0.5 ug/mL — ABNORMAL LOW (ref >1.3)
Pneumo Ab Type 8*: 1.3 ug/mL — ABNORMAL LOW (ref >1.3)
Pneumo Ab Type 9 (9N)*: 4.1 ug/mL (ref >1.3)

## 2021-02-24 LAB — DIPHTHERIA / TETANUS ANTIBODY PANEL
Diphtheria Ab: <0.1 IU/mL — ABNORMAL LOW (ref <0.10)
Tetanus Ab, IgG: 1.52 IU/mL (ref <0.10)

## 2021-03-12 NOTE — Progress Notes (Signed)
Error

## 2021-07-22 ENCOUNTER — Other Ambulatory Visit: Payer: Self-pay

## 2021-07-22 ENCOUNTER — Ambulatory Visit: Payer: Managed Care, Other (non HMO) | Admitting: Allergy

## 2021-07-22 ENCOUNTER — Encounter: Payer: Self-pay | Admitting: Allergy

## 2021-07-22 VITALS — BP 116/74 | HR 90 | Temp 97.2°F | Resp 16 | Ht 66.0 in | Wt 277.4 lb

## 2021-07-22 DIAGNOSIS — J3089 Other allergic rhinitis: Secondary | ICD-10-CM

## 2021-07-22 DIAGNOSIS — H1013 Acute atopic conjunctivitis, bilateral: Secondary | ICD-10-CM | POA: Diagnosis not present

## 2021-07-22 DIAGNOSIS — J4550 Severe persistent asthma, uncomplicated: Secondary | ICD-10-CM

## 2021-07-22 DIAGNOSIS — J329 Chronic sinusitis, unspecified: Secondary | ICD-10-CM

## 2021-07-22 MED ORDER — IPRATROPIUM BROMIDE 0.06 % NA SOLN: 2.0000 | Freq: Four times a day (QID) | NASAL | 5 refills | Status: DC | PRN

## 2021-07-22 MED ORDER — BENZONATATE 100 MG PO CAPS: 100.0000 mg | ORAL_CAPSULE | Freq: Three times a day (TID) | ORAL | 0 refills | Status: DC | PRN

## 2021-07-22 MED ORDER — RYALTRIS 665-25 MCG/ACT NA SUSP: 2.0000 | Freq: Two times a day (BID) | NASAL | 1 refills | Status: DC

## 2021-07-22 NOTE — Progress Notes (Signed)
? ? ?Follow-up Note ? ?RE: Monique Harrison MRN: [id number] DOB: 09-Nov-1975 ?Date of Office Visit: 07/22/2021 ? ? ?History of present illness: ?Monique Harrison is a 46 y.o. female presenting today for follow-up of asthma, allergic rhinitis with conjunctivitis and history of recurrent sinusitis.  She was last seen in the office on 02/03/2021 by myself. ?She was seeing Dr. Benjamine Mola for her history of recurrent sinusitis and to sinus disease with changes on a sinus CT.  She recently learned that he is no longer providing care for throat related issues.  She had called for follow-up as she was having issues with sore throat.  She was told by his receptionist that he was no longer seeing throat issues.  She would like a referral for another ENT specialist. ? ?She states that her asthma is doing quite well at this time.  She has not needed to use albuterol a couple times during the following days when the weather changed for issues of cough or shortness of breath or wheeze.  Otherwise she has not required significant use of her albuterol since the last visit.  She is using Trelegy 1 puff once a day and this is controlling her quite well at this time.  She has not had any systemic steroids for asthma since the last visit.   ? ?She is interested in allergy shots at this time.  Her allergy symptoms at this point are the biggest concern for her.  She has a lot of sinus pressure, congestion as well as drainage.  She was taking Allegra-D but states she just finished up a box and her symptoms are worsening at this time.  She also needs refills of her nasal sprays that have been recommended by Dr. Lorelee Cover which include the Dymista nasal spray and Atrovent nasal spray.  He also have provided her with Ladona Ridgel which she also left.  She does report performing nasal lavage and this has been quite helpful done twice a day.   ? ?She has not had any infections requiring antibiotics since the last visit. ? ?Review of systems: ?Review of  Systems  ?Constitutional: Negative.   ?HENT:  Positive for congestion, rhinorrhea and sinus pressure.   ?Eyes: Negative.   ?Respiratory: Negative.    ?Cardiovascular: Negative.   ?Gastrointestinal: Negative.   ?Musculoskeletal: Negative.   ?Skin: Negative.   ?Allergic/Immunologic: Negative.   ?Neurological: Negative.    ? ?All other systems negative unless noted above in HPI ? ?Past medical/social/surgical/family history have been reviewed and are unchanged unless specifically indicated below. ? ?No changes ? ?Medication List: ?Current Outpatient Medications  ?Medication Sig Dispense Refill  ? albuterol (PROVENTIL) (2.5 MG/3ML) 0.083% nebulizer solution Can use one vial in nebulizer every four to six hours as needed for cough/wheeze/shortness of breath/chest tightness. 75 mL 1  ? albuterol (VENTOLIN HFA) 108 (90 Base) MCG/ACT inhaler Inhale 2 puffs into the lungs every 4 (four) hours as needed. 18 g 1  ? Ascorbic Acid (VITAMIN C PO) Take by mouth.    ? Azelastine-Fluticasone 137-50 MCG/ACT SUSP Place 1 spray into both nostrils 2 (two) times daily.    ? benzonatate (TESSALON) 100 MG capsule Take 1 capsule (100 mg total) by mouth every 8 (eight) hours. 30 capsule 1  ? Cetirizine-Pseudoephedrine (ZYRTEC-D PO) Take by mouth.    ? Cholecalciferol (VITAMIN D3) 3000 units TABS Take 3,000 Units by mouth daily.    ? Cholecalciferol 25 MCG (1000 UT) tablet Take by mouth.    ? Cyanocobalamin (B-12 PO)  Take by mouth.    ? Fexofenadine-Pseudoephedrine (ALLEGRA-D PO) Take by mouth.    ? Fluticasone-Umeclidin-Vilant (TRELEGY ELLIPTA) 200-62.5-25 MCG/INH AEPB Inhale 1 puff into the lungs daily. 60 each 5  ? ipratropium-albuterol (DUONEB) 0.5-2.5 (3) MG/3ML SOLN Take 3 mLs by nebulization every 4 (four) hours as needed. 360 mL 0  ? olopatadine (PATANOL) 0.1 % ophthalmic solution Place 1 drop into both eyes 2 (two) times daily as needed for allergies (itchy eyes). 5 mL 3  ? ursodiol (ACTIGALL) 300 MG capsule Take 300 mg by mouth 2  (two) times daily.    ? ?No current facility-administered medications for this visit.  ?  ? ?Known medication allergies: ?Allergies  ?Allergen Reactions  ? Aspirin Shortness Of Breath  ?  Pt tolerates aspirin for occasional use.  ? ? ? ?Physical examination: ?Blood pressure 116/74, pulse 90, temperature (!) 97.2 ?F (36.2 ?C), resp. rate 16, height '5\' 6"'$  (1.676 m), weight 277 lb 6.4 oz (125.8 kg), SpO2 95 %. ? ?General: Alert, interactive, in no acute distress. ?HEENT: PERRLA, TMs pearly gray, turbinates minimally edematous without discharge, post-pharynx non erythematous. ?Neck: Supple without lymphadenopathy. ?Lungs: Clear to auscultation without wheezing, rhonchi or rales. {no increased work of breathing. ?CV: Normal S1, S2 without murmurs. ?Abdomen: Nondistended, nontender. ?Skin: Warm and dry, without lesions or rashes. ?Extremities:  No clubbing, cyanosis or edema. ?Neuro:   Grossly intact. ? ?Diagnositics/Labs: ?None today ? ?Assessment and plan: ?Asthma ?    - continue Trelegy (triple medication therapy) 1 puff once a day.     ?    - have access to albuterol inhaler 2 puffs or nebulizer 1 vial every 4-6 hours as needed for cough/wheeze/shortness of breath/chest tightness.  May use 15-20 minutes prior to activity.   Monitor frequency of use.   ?    - at this time symptoms are under control ?    - will refill tessalon perls for as needed use for cough ? ?    Asthma control goals:  ?Full participation in all desired activities (may need albuterol before activity) ?Albuterol use two time or less a week on average (not counting use with activity) ?Cough interfering with sleep two time or less a month ?Oral steroids no more than once a year ?No hospitalizations ? ?Allergic rhinitis with conjunctivitis ?   - continue avoidance measures for dust mites, cat, dog, grasses, trees, weeds ?   - your last allergy testing is from 2019 thus will get updated testing today as we plan to start allergen immunotherapy ?   - use  Allegra plain daily.  Would reserve Allegra-D for when sinus symptoms are more severe.  Monitor your BP while taking medications containing decongestants.  ?   - will refill your nasal spray, Atrovent 2 sprays each nostril up to 4 times a day as needed for nasal drainage control ?   - discussed Ryaltris nasal spray which is similar to your Dymista (brown bottle spray) but has better taste profile and works faster.  Will send in prescription for you to try this option and if it works better than Dymista it can replace it.   ?   - will place new ENT referral to [facility] (Dr Skotniki's office) to replace care from Dr. Benjamine Mola ? ?Recurrent sinusitis ? -Immunocompetence work-up showed normal immunoglobulin levels however you have poor protective titers to strep pneumo and diphtheria.  You did receive Pneumovax and diphtheria booster vaccines.  You did make an appropriate antibody response to  pneumovax however remain unprotected against diphtheria.   ? - sinus CT showed significant thickening of the lining of the sinuses (maxillary, sphenoid and ethmoid) which can be seen in chronic sinus disease ? ?Follow-up 4-6 months or sooner if needed ?I appreciate the opportunity to take part in [place] care. Please do not hesitate to contact me with questions. ? ?Sincerely, ? ? ?Prudy Feeler, MD ?Allergy/Immunology ?[facility]  ? ? ?

## 2021-07-22 NOTE — Patient Instructions (Addendum)
Asthma ?    - continue Trelegy (triple medication therapy) 1 puff once a day.     ?    - have access to albuterol inhaler 2 puffs or nebulizer 1 vial every 4-6 hours as needed for cough/wheeze/shortness of breath/chest tightness.  May use 15-20 minutes prior to activity.   Monitor frequency of use.   ?    - at this time symptoms are under control ?    - will refill tessalon perls for as needed use for cough ? ?    Asthma control goals:  ?Full participation in all desired activities (may need albuterol before activity) ?Albuterol use two time or less a week on average (not counting use with activity) ?Cough interfering with sleep two time or less a month ?Oral steroids no more than once a year ?No hospitalizations ? ?Allergic rhinitis with conjunctivitis ?   - continue avoidance measures for dust mites, cat, dog, grasses, trees, weeds ?   - your last allergy testing is from 2019 thus will get updated testing today as we plan to start allergen immunotherapy ?   - use Allegra plain daily.  Would reserve Allegra-D for when sinus symptoms are more severe.  Monitor your BP while taking medications containing decongestants.  ?   - will refill your nasal spray, Atrovent 2 sprays each nostril up to 4 times a day as needed for nasal drainage control ?   - discussed Ryaltris nasal spray which is similar to your Dymista (brown bottle spray) but has better taste profile and works faster.  Will send in prescription for you to try this option and if it works better than Dymista it can replace it.   ?   - will place new ENT referral to [facility] (Dr Skotniki's office) to replace care from Dr. Benjamine Mola ? ?Recurrent sinusitis ? -Immunocompetence work-up showed normal immunoglobulin levels however you have poor protective titers to strep pneumo and diphtheria.  You did receive Pneumovax and diphtheria booster vaccines.  You did make an appropriate antibody response to pneumovax however remain unprotected against diphtheria.   ? - sinus CT  showed significant thickening of the lining of the sinuses (maxillary, sphenoid and ethmoid) which can be seen in chronic sinus disease ? ?Follow-up 4-6 months or sooner if needed ?

## 2021-07-24 LAB — ALLERGENS W/TOTAL IGE AREA 2
Alternaria Alternata IgE: <0.1 kU/L
Aspergillus Fumigatus IgE: <0.1 kU/L
Bermuda Grass IgE: 0.57 kU/L — AB
Cat Dander IgE: 9.02 kU/L — AB
Cedar, Mountain IgE: 1.38 kU/L — AB
Cladosporium Herbarum IgE: <0.1 kU/L
Cockroach, German IgE: <0.1 kU/L
Common Silver Birch IgE: <0.1 kU/L
Cottonwood IgE: <0.1 kU/L
D Farinae IgE: 0.5 kU/L — AB
D Pteronyssinus IgE: 0.19 kU/L — AB
Dog Dander IgE: 1.39 kU/L — AB
Elm, American IgE: 0.11 kU/L — AB
IgE (Immunoglobulin E), Serum: 96 IU/mL (ref 6–495)
Johnson Grass IgE: 1.53 kU/L — AB
Maple/Box Elder IgE: <0.1 kU/L
Mouse Urine IgE: <0.1 kU/L
Oak, White IgE: 0.78 kU/L — AB
Pecan, Hickory IgE: 0.11 kU/L — AB
Penicillium Chrysogen IgE: <0.1 kU/L
Pigweed, Rough IgE: <0.1 kU/L
Ragweed, Short IgE: 0.11 kU/L — AB
Sheep Sorrel IgE Qn: <0.1 kU/L
Timothy Grass IgE: 2.48 kU/L — AB
White Mulberry IgE: <0.1 kU/L

## 2021-07-27 ENCOUNTER — Telehealth: Payer: Self-pay

## 2021-07-27 NOTE — Telephone Encounter (Signed)
-----   Message from [place], MD sent at 07/22/2021  1:53 PM EDT ----- ?Patient referral for ENT with Dr. Fredric Dine for history of recurrent sinusitis.  She is a former Dr. Benjamine Mola pt (she called for follow-up with complaint of sore throat and was told he is not seeing this anymore thus she wants referral elswhere).  ?

## 2021-08-03 ENCOUNTER — Other Ambulatory Visit: Payer: Self-pay | Admitting: *Deleted

## 2021-08-03 MED ORDER — TRELEGY ELLIPTA 200-62.5-25 MCG/ACT IN AEPB: 1.0000 | INHALATION_SPRAY | Freq: Every day | RESPIRATORY_TRACT | 5 refills | Status: DC

## 2021-08-13 NOTE — Telephone Encounter (Signed)
According to Care Everywhere the patient is scheduled: ? ?10/01/2021  1:15 PM   ? ?Providers ? ?[place], Ann Lions, PA-C ?

## 2021-10-09 ENCOUNTER — Telehealth: Payer: Self-pay

## 2021-10-09 NOTE — Telephone Encounter (Signed)
Patient called in -DOB verified - requesting Tessalon Pearles being sent in to pharmacy due to coughing. Patient stated she is presently on antibiotics and Prednisone from her ENT.   Patient was advised to contact ENT for Rx Tessalon Pearles due to that office is presently treating her with antibiotic and Prednisone. Patient verbalized understanding, no further questions.

## 2021-11-10 DIAGNOSIS — Z01419 Encounter for gynecological examination (general) (routine) without abnormal findings: Secondary | ICD-10-CM | POA: Diagnosis not present

## 2021-11-10 DIAGNOSIS — R102 Pelvic and perineal pain: Secondary | ICD-10-CM | POA: Diagnosis not present

## 2021-11-10 DIAGNOSIS — Z124 Encounter for screening for malignant neoplasm of cervix: Secondary | ICD-10-CM | POA: Diagnosis not present

## 2021-11-10 DIAGNOSIS — Z1211 Encounter for screening for malignant neoplasm of colon: Secondary | ICD-10-CM | POA: Diagnosis not present

## 2021-11-10 DIAGNOSIS — Z1231 Encounter for screening mammogram for malignant neoplasm of breast: Secondary | ICD-10-CM | POA: Diagnosis not present

## 2021-11-12 DIAGNOSIS — K831 Obstruction of bile duct: Secondary | ICD-10-CM | POA: Diagnosis not present

## 2021-11-19 ENCOUNTER — Telehealth: Payer: Self-pay | Admitting: Allergy

## 2021-11-19 NOTE — Telephone Encounter (Signed)
Please advise on an alternative for Ryaltris. It cost $50 for the patient.

## 2021-11-19 NOTE — Telephone Encounter (Signed)
Patient states the Ryaltris is $50 and would like to know if there is a copay card or an alternative that could be sent in for her.   Please advise  Best contact number: [phone]

## 2021-11-20 MED ORDER — FLUTICASONE PROPIONATE 50 MCG/ACT NA SUSP: 2.0000 | Freq: Every day | NASAL | 5 refills | Status: DC

## 2021-11-20 MED ORDER — AZELASTINE HCL 0.1 % NA SOLN: 2.0000 | Freq: Two times a day (BID) | NASAL | 5 refills | Status: DC

## 2021-11-20 NOTE — Telephone Encounter (Signed)
The patient's insurance cover Flonase and azelastine nasal spray. I sent the nasal sprays to the [facility]. I was not able to leave a message due to the patient's mailbox being full.

## 2021-11-23 NOTE — Telephone Encounter (Signed)
Attempted to call patient, there was no answer and voicemail was full. Will need to call again.

## 2021-11-24 DIAGNOSIS — J309 Allergic rhinitis, unspecified: Secondary | ICD-10-CM | POA: Diagnosis not present

## 2021-11-24 DIAGNOSIS — J329 Chronic sinusitis, unspecified: Secondary | ICD-10-CM | POA: Diagnosis not present

## 2021-11-24 DIAGNOSIS — J320 Chronic maxillary sinusitis: Secondary | ICD-10-CM | POA: Diagnosis not present

## 2021-12-04 DIAGNOSIS — F418 Other specified anxiety disorders: Secondary | ICD-10-CM | POA: Diagnosis not present

## 2021-12-04 DIAGNOSIS — F331 Major depressive disorder, recurrent, moderate: Secondary | ICD-10-CM | POA: Diagnosis not present

## 2021-12-04 NOTE — Telephone Encounter (Signed)
Spoke with patient and advised of change in medications and that rx's have been sent to [facility]. Pt states she will pick them up tomorrow. Nothing further needed at this time.

## 2021-12-16 ENCOUNTER — Telehealth: Payer: Self-pay | Admitting: Allergy

## 2021-12-16 MED ORDER — ALBUTEROL SULFATE HFA 108 (90 BASE) MCG/ACT IN AERS: 2.0000 | INHALATION_SPRAY | RESPIRATORY_TRACT | 0 refills | Status: DC | PRN

## 2021-12-16 NOTE — Telephone Encounter (Signed)
Monique Harrison called in and would like refills of her Albuterol inhaler and Tessalon sent to [facility] on [address].  Please advise.

## 2021-12-16 NOTE — Telephone Encounter (Signed)
Spoke with patient, informed her that I could send in the albuterol inhaler however I would need to reach out to the provider regarding the Tessalon. Dr. Ernst Bowler due to Dr. Nelva Bush being out of the office please advise.

## 2021-12-17 MED ORDER — BENZONATATE 100 MG PO CAPS: 100.0000 mg | ORAL_CAPSULE | Freq: Three times a day (TID) | ORAL | 0 refills | Status: DC | PRN

## 2021-12-17 NOTE — Telephone Encounter (Signed)
I am fine with that. I sent it in.   Salvatore Marvel, MD [facility] [place] [facility] [place]

## 2021-12-17 NOTE — Addendum Note (Signed)
Addended by: Valentina Shaggy on: 12/17/2021 07:04 PM   Modules accepted: Orders

## 2021-12-17 NOTE — Telephone Encounter (Signed)
Called and informed patient. Patient verbalized understanding.  

## 2021-12-23 ENCOUNTER — Ambulatory Visit: Payer: Managed Care, Other (non HMO) | Admitting: Allergy

## 2021-12-23 DIAGNOSIS — H40033 Anatomical narrow angle, bilateral: Secondary | ICD-10-CM | POA: Diagnosis not present

## 2021-12-23 DIAGNOSIS — H524 Presbyopia: Secondary | ICD-10-CM | POA: Diagnosis not present

## 2021-12-24 DIAGNOSIS — F331 Major depressive disorder, recurrent, moderate: Secondary | ICD-10-CM | POA: Diagnosis not present

## 2021-12-24 DIAGNOSIS — F418 Other specified anxiety disorders: Secondary | ICD-10-CM | POA: Diagnosis not present

## 2021-12-30 ENCOUNTER — Emergency Department (HOSPITAL_COMMUNITY): Payer: BC Managed Care – PPO

## 2021-12-30 ENCOUNTER — Inpatient Hospital Stay (HOSPITAL_COMMUNITY): Payer: BC Managed Care – PPO

## 2021-12-30 ENCOUNTER — Other Ambulatory Visit: Payer: Self-pay

## 2021-12-30 ENCOUNTER — Inpatient Hospital Stay (HOSPITAL_COMMUNITY)
Admission: EM | Admit: 2021-12-30 | Discharge: 2022-01-03 | DRG: 194 | Disposition: A | Discharge status: Home / Self Care | Payer: BC Managed Care – PPO | Attending: Internal Medicine | Admitting: Internal Medicine

## 2021-12-30 ENCOUNTER — Encounter (HOSPITAL_COMMUNITY): Payer: Self-pay

## 2021-12-30 DIAGNOSIS — J4540 Moderate persistent asthma, uncomplicated: Secondary | ICD-10-CM | POA: Diagnosis not present

## 2021-12-30 DIAGNOSIS — Z7951 Long term (current) use of inhaled steroids: Secondary | ICD-10-CM

## 2021-12-30 DIAGNOSIS — K769 Liver disease, unspecified: Secondary | ICD-10-CM | POA: Diagnosis present

## 2021-12-30 DIAGNOSIS — R0902 Hypoxemia: Secondary | ICD-10-CM | POA: Diagnosis not present

## 2021-12-30 DIAGNOSIS — J869 Pyothorax without fistula: Secondary | ICD-10-CM | POA: Diagnosis present

## 2021-12-30 DIAGNOSIS — Z79899 Other long term (current) drug therapy: Secondary | ICD-10-CM | POA: Diagnosis not present

## 2021-12-30 DIAGNOSIS — Z886 Allergy status to analgesic agent status: Secondary | ICD-10-CM

## 2021-12-30 DIAGNOSIS — J90 Pleural effusion, not elsewhere classified: Secondary | ICD-10-CM | POA: Diagnosis present

## 2021-12-30 DIAGNOSIS — Z20822 Contact with and (suspected) exposure to covid-19: Secondary | ICD-10-CM | POA: Diagnosis present

## 2021-12-30 DIAGNOSIS — J4541 Moderate persistent asthma with (acute) exacerbation: Secondary | ICD-10-CM | POA: Diagnosis not present

## 2021-12-30 DIAGNOSIS — K831 Obstruction of bile duct: Secondary | ICD-10-CM | POA: Diagnosis not present

## 2021-12-30 DIAGNOSIS — R7989 Other specified abnormal findings of blood chemistry: Secondary | ICD-10-CM | POA: Diagnosis not present

## 2021-12-30 DIAGNOSIS — R079 Chest pain, unspecified: Secondary | ICD-10-CM | POA: Diagnosis not present

## 2021-12-30 DIAGNOSIS — D734 Cyst of spleen: Secondary | ICD-10-CM | POA: Diagnosis present

## 2021-12-30 DIAGNOSIS — J9811 Atelectasis: Secondary | ICD-10-CM | POA: Diagnosis not present

## 2021-12-30 DIAGNOSIS — Z4682 Encounter for fitting and adjustment of non-vascular catheter: Secondary | ICD-10-CM | POA: Diagnosis not present

## 2021-12-30 DIAGNOSIS — J45909 Unspecified asthma, uncomplicated: Secondary | ICD-10-CM | POA: Diagnosis not present

## 2021-12-30 DIAGNOSIS — R092 Respiratory arrest: Secondary | ICD-10-CM | POA: Diagnosis not present

## 2021-12-30 DIAGNOSIS — J329 Chronic sinusitis, unspecified: Secondary | ICD-10-CM | POA: Diagnosis not present

## 2021-12-30 DIAGNOSIS — J189 Pneumonia, unspecified organism: Principal | ICD-10-CM | POA: Diagnosis present

## 2021-12-30 DIAGNOSIS — J918 Pleural effusion in other conditions classified elsewhere: Secondary | ICD-10-CM | POA: Diagnosis not present

## 2021-12-30 DIAGNOSIS — J188 Other pneumonia, unspecified organism: Secondary | ICD-10-CM | POA: Diagnosis not present

## 2021-12-30 DIAGNOSIS — R091 Pleurisy: Secondary | ICD-10-CM | POA: Diagnosis not present

## 2021-12-30 LAB — BODY FLUID CELL COUNT WITH DIFFERENTIAL
Lymphs, Fluid: 5 %
Monocyte-Macrophage-Serous Fluid: 10 % — ABNORMAL LOW (ref 50–90)
Neutrophil Count, Fluid: 85 % — ABNORMAL HIGH (ref 0–25)
Total Nucleated Cell Count, Fluid: 10850 cu mm — ABNORMAL HIGH (ref 0–1000)

## 2021-12-30 LAB — CBC
HCT: 42.4 % (ref 36.0–46.0)
HCT: 41.3 % (ref 36.0–46.0)
Hemoglobin: 14 g/dL (ref 12.0–15.0)
Hemoglobin: 13.5 g/dL (ref 12.0–15.0)
MCH: 30.7 pg (ref 26.0–34.0)
MCH: 30.6 pg (ref 26.0–34.0)
MCHC: 33 g/dL (ref 30.0–36.0)
MCHC: 32.7 g/dL (ref 30.0–36.0)
MCV: 93 fL (ref 80.0–100.0)
MCV: 93.7 fL (ref 80.0–100.0)
Platelets: 294 10*3/uL (ref 150–400)
Platelets: 293 10*3/uL (ref 150–400)
RBC: 4.56 MIL/uL (ref 3.87–5.11)
RBC: 4.41 MIL/uL (ref 3.87–5.11)
RDW: 13.1 % (ref 11.5–15.5)
RDW: 13.1 % (ref 11.5–15.5)
WBC: 18.3 10*3/uL — ABNORMAL HIGH (ref 4.0–10.5)
WBC: 19.1 10*3/uL — ABNORMAL HIGH (ref 4.0–10.5)
nRBC: 0 % (ref 0.0–0.2)
nRBC: 0 % (ref 0.0–0.2)

## 2021-12-30 LAB — TROPONIN I (HIGH SENSITIVITY)
Troponin I (High Sensitivity): 4 ng/L (ref <18)
Troponin I (High Sensitivity): 6 ng/L (ref <18)

## 2021-12-30 LAB — PROTEIN, PLEURAL OR PERITONEAL FLUID: Total protein, fluid: 5.4 g/dL

## 2021-12-30 LAB — CREATININE, SERUM
Creatinine, Ser: 0.8 mg/dL (ref 0.44–1.00)
GFR, Estimated: >60 mL/min (ref >60)

## 2021-12-30 LAB — BASIC METABOLIC PANEL
Anion gap: 14 (ref 5–15)
BUN: 9 mg/dL (ref 6–20)
CO2: 22 mmol/L (ref 22–32)
Calcium: 9.4 mg/dL (ref 8.9–10.3)
Chloride: 100 mmol/L (ref 98–111)
Creatinine, Ser: 0.85 mg/dL (ref 0.44–1.00)
GFR, Estimated: >60 mL/min (ref >60)
Glucose, Bld: 146 mg/dL — ABNORMAL HIGH (ref 70–99)
Potassium: 3.9 mmol/L (ref 3.5–5.1)
Sodium: 136 mmol/L (ref 135–145)

## 2021-12-30 LAB — PROCALCITONIN: Procalcitonin: 0.16 ng/mL

## 2021-12-30 LAB — SARS CORONAVIRUS 2 BY RT PCR: SARS Coronavirus 2 by RT PCR: NEGATIVE

## 2021-12-30 LAB — I-STAT BETA HCG BLOOD, ED (MC, WL, AP ONLY): I-stat hCG, quantitative: <5 m[IU]/mL (ref <5)

## 2021-12-30 LAB — LACTATE DEHYDROGENASE, PLEURAL OR PERITONEAL FLUID: LD, Fluid: 404 U/L — ABNORMAL HIGH (ref 3–23)

## 2021-12-30 LAB — STREP PNEUMONIAE URINARY ANTIGEN: Strep Pneumo Urinary Antigen: NEGATIVE

## 2021-12-30 LAB — D-DIMER, QUANTITATIVE: D-Dimer, Quant: 4.29 ug/mL-FEU — ABNORMAL HIGH (ref 0.00–0.50)

## 2021-12-30 LAB — HIV ANTIBODY (ROUTINE TESTING W REFLEX): HIV Screen 4th Generation wRfx: NONREACTIVE

## 2021-12-30 MED ORDER — SODIUM CHLORIDE 0.9 % IV SOLN
2.0000 g | Freq: Once | INTRAVENOUS | Status: AC
  Administered 2021-12-30: 2 g via INTRAVENOUS
  Filled 2021-12-30: qty 20

## 2021-12-30 MED ORDER — FLUTICASONE FUROATE-VILANTEROL 200-25 MCG/ACT IN AEPB
1.0000 | INHALATION_SPRAY | Freq: Every day | RESPIRATORY_TRACT | Status: DC
  Administered 2021-12-31 – 2022-01-03 (×4): 1 via RESPIRATORY_TRACT
  Filled 2021-12-30: qty 28

## 2021-12-30 MED ORDER — SODIUM CHLORIDE 0.9% FLUSH
10.0000 mL | Freq: Three times a day (TID) | INTRAVENOUS | Status: DC
  Administered 2021-12-30 – 2022-01-03 (×12): 10 mL via INTRAPLEURAL

## 2021-12-30 MED ORDER — ENOXAPARIN SODIUM 40 MG/0.4ML IJ SOSY
40.0000 mg | PREFILLED_SYRINGE | INTRAMUSCULAR | Status: DC
  Administered 2021-12-30 – 2022-01-03 (×5): 40 mg via SUBCUTANEOUS
  Filled 2021-12-30 (×5): qty 0.4

## 2021-12-30 MED ORDER — AZITHROMYCIN 500 MG IV SOLR
500.0000 mg | Freq: Once | INTRAVENOUS | Status: AC
  Administered 2021-12-30: 500 mg via INTRAVENOUS
  Filled 2021-12-30: qty 5

## 2021-12-30 MED ORDER — ALBUTEROL SULFATE (2.5 MG/3ML) 0.083% IN NEBU
2.5000 mg | INHALATION_SOLUTION | RESPIRATORY_TRACT | Status: DC | PRN
Start: 2021-12-30 — End: 2021-12-31
  Administered 2021-12-31: 2.5 mg via RESPIRATORY_TRACT
  Filled 2021-12-30: qty 3

## 2021-12-30 MED ORDER — SODIUM CHLORIDE 0.9 % IV SOLN
2.0000 g | Freq: Every day | INTRAVENOUS | Status: DC
  Administered 2021-12-31: 2 g via INTRAVENOUS
  Filled 2021-12-30: qty 20

## 2021-12-30 MED ORDER — KETOROLAC TROMETHAMINE 30 MG/ML IJ SOLN
30.0000 mg | Freq: Four times a day (QID) | INTRAMUSCULAR | Status: DC
Start: 2021-12-30 — End: 2021-12-31
  Filled 2021-12-30 (×2): qty 1

## 2021-12-30 MED ORDER — BENZONATATE 100 MG PO CAPS
100.0000 mg | ORAL_CAPSULE | Freq: Three times a day (TID) | ORAL | Status: DC | PRN
Start: 2021-12-30 — End: 2022-01-04
  Administered 2021-12-31 – 2022-01-02 (×5): 100 mg via ORAL
  Filled 2021-12-30 (×6): qty 1

## 2021-12-30 MED ORDER — SODIUM CHLORIDE 0.9 % IV SOLN
500.0000 mg | INTRAVENOUS | Status: AC
  Administered 2021-12-31 – 2022-01-01 (×2): 500 mg via INTRAVENOUS
  Filled 2021-12-30 (×2): qty 5

## 2021-12-30 MED ORDER — FENTANYL CITRATE PF 50 MCG/ML IJ SOSY
50.0000 ug | PREFILLED_SYRINGE | Freq: Once | INTRAMUSCULAR | Status: AC
  Administered 2021-12-30: 50 ug via INTRAVENOUS
  Filled 2021-12-30: qty 1

## 2021-12-30 MED ORDER — UMECLIDINIUM BROMIDE 62.5 MCG/ACT IN AEPB
1.0000 | INHALATION_SPRAY | Freq: Every day | RESPIRATORY_TRACT | Status: DC
  Administered 2021-12-31 – 2022-01-03 (×4): 1 via RESPIRATORY_TRACT
  Filled 2021-12-30: qty 7

## 2021-12-30 MED ORDER — FENTANYL CITRATE PF 50 MCG/ML IJ SOSY
50.0000 ug | PREFILLED_SYRINGE | Freq: Once | INTRAMUSCULAR | Status: AC
  Administered 2021-12-30: 50 ug via INTRAVENOUS

## 2021-12-30 MED ORDER — FENTANYL CITRATE PF 50 MCG/ML IJ SOSY
PREFILLED_SYRINGE | INTRAMUSCULAR | Status: AC
  Filled 2021-12-30: qty 2

## 2021-12-30 MED ORDER — URSODIOL 300 MG PO CAPS
300.0000 mg | ORAL_CAPSULE | Freq: Two times a day (BID) | ORAL | Status: DC
  Administered 2021-12-31 – 2022-01-03 (×8): 300 mg via ORAL
  Filled 2021-12-30 (×9): qty 1

## 2021-12-30 MED ORDER — ACETAMINOPHEN 325 MG PO TABS
650.0000 mg | ORAL_TABLET | Freq: Four times a day (QID) | ORAL | Status: DC | PRN
Start: 2021-12-30 — End: 2022-01-04
  Administered 2021-12-30 – 2022-01-03 (×9): 650 mg via ORAL
  Filled 2021-12-30 (×9): qty 2

## 2021-12-30 MED ORDER — SERTRALINE HCL 50 MG PO TABS
50.0000 mg | ORAL_TABLET | Freq: Every day | ORAL | Status: DC
  Administered 2021-12-30 – 2022-01-03 (×5): 50 mg via ORAL
  Filled 2021-12-30 (×6): qty 1

## 2021-12-30 MED ORDER — ACETAMINOPHEN 500 MG PO TABS
1000.0000 mg | ORAL_TABLET | Freq: Once | ORAL | Status: AC
  Administered 2021-12-30: 1000 mg via ORAL
  Filled 2021-12-30: qty 2

## 2021-12-30 MED ORDER — IOHEXOL 350 MG/ML SOLN
50.0000 mL | Freq: Once | INTRAVENOUS | Status: AC | PRN
Start: 2021-12-30 — End: 2021-12-30
  Administered 2021-12-30: 50 mL via INTRAVENOUS

## 2021-12-30 MED ORDER — FENTANYL CITRATE PF 50 MCG/ML IJ SOSY
50.0000 ug | PREFILLED_SYRINGE | INTRAMUSCULAR | Status: DC | PRN
  Administered 2021-12-30 – 2021-12-31 (×5): 50 ug via INTRAVENOUS
  Filled 2021-12-30 (×5): qty 1

## 2021-12-30 NOTE — Procedures (Signed)
Insertion of Chest Tube Procedure Note  Monique Harrison  [id number]  08-Oct-1975  Date:12/30/21  Time:5:32 PM    Provider Performing: Candee Furbish   Procedure: Pleural Catheter Insertion w/ Imaging Guidance [phone])  Indication(s) Effusion  Consent Verbal  Anesthesia Topical only with 1% lidocaine    Time Out Verified patient identification, verified procedure, site/side was marked, verified correct patient position, special equipment/implants available, medications/allergies/relevant history reviewed, required imaging and test results available.   Sterile Technique Maximal sterile technique including full sterile barrier drape, hand hygiene, sterile gown, sterile gloves, mask, hair covering, sterile ultrasound probe cover (if used).   Procedure Description Ultrasound used to identify appropriate pleural anatomy for placement and overlying skin marked. Area of placement cleaned and draped in sterile fashion.  Started with thoracentesis, had good deal of pleurisy with even minimal drainage of murky fluid.  Given likely need for lytics to drain space, thoracentesis catheter cut and wire advanced through it into pleural space.  Thoracentesis catheter then removed over wire.  A 14 french pigtail advanced over wire into pleural space with drainage of fluid.  Patient had good deal of pain during procedure, given 110mg fentanyl  The tube was connected to atrium and placed on -20 cm H2O wall suction.   Complications/Tolerance See above Chest X-ray is ordered to verify placement.   EBL Minimal  Specimen(s) fluid

## 2021-12-30 NOTE — ED Notes (Signed)
Erline Levine the receiving RN was called, she informed this RN that was going to call back because she did not know she was getting the patient

## 2021-12-30 NOTE — ED Notes (Signed)
Pt c/o headache. EDP aware. Pt medicated.

## 2021-12-30 NOTE — H&P (Cosign Needed Addendum)
Date: 12/30/2021               Patient Name:  Monique Harrison MRN: [id number]  DOB: [birth year] Age / Sex: 46 y.o., female   PCP: Kathrine Haddock, MD         Medical Service: Internal Medicine Teaching Service         Attending Physician: Dr. Sid Falcon, MD    First Contact: Delene Ruffini, MD Pager: [phone]  Second Contact: Iona Beard, MD Pager: [phone]       After Hours (After 5p/  First Contact Pager: [phone]  weekends / holidays): Second Contact Pager: [phone]   SUBJECTIVE   Chief Complaint: shortness of breath  History of Present Illness:  32 yof with hx of asthma who presented with 2 days of left sided pain under her breast that radiated to her left arm and back past few days. Pain exacerbated with deep breathing, but not exertion. Endorsed SHOB. Hx of asthma and took 3X breathing treatments today, but this has not helped. Has been coughing with thick green sputum production. Has not felt febrile.  She reports that due to asthma, she frequently coughs up similar mucus and has not noticed significant change in this regard. She does feel more wheezy. Also experiencing fatigue. No swelling in her legs. She had similar symptoms roughly 2 weeks ago and had planned on seeing her asthma doctor, however symptoms resolved on their own so she cancelled appointment prior to leaving for vacation.   Meds:  Fexofenadine 180 mg Pseudoephedrine 240 mg Ursodiol 300 mg daily Probiotics D3 Benzonatate TID Sertraline 50 mg daily Trelegy Magnesium  Current Meds  Medication Sig   albuterol (VENTOLIN HFA) 108 (90 Base) MCG/ACT inhaler Inhale 2 puffs into the lungs every 4 (four) hours as needed. (Patient taking differently: Inhale 2 puffs into the lungs every 4 (four) hours as needed for wheezing or shortness of breath.)   benzonatate (TESSALON PERLES) 100 MG capsule Take 1 capsule (100 mg total) by mouth 3 (three) times daily as needed for cough.   Cholecalciferol (VITAMIN  D3) 125 MCG (5000 UT) CAPS Take 5,000 Units by mouth daily.   fexofenadine-pseudoephedrine (ALLEGRA-D 24) 180-240 MG 24 hr tablet Take 1 tablet by mouth daily as needed (allergies, sinus pressure).   Magnesium 250 MG TABS Take 1 tablet by mouth daily.   Probiotic Product (PROBIOTIC PO) Take 1 capsule by mouth daily. Dr. Levonne Lapping probiotic   sertraline (ZOLOFT) 50 MG tablet Take 50 mg by mouth daily.   TRELEGY ELLIPTA 200-62.5-25 MCG/ACT AEPB Inhale 1 puff into the lungs daily.   ursodiol (ACTIGALL) 300 MG capsule Take 300 mg by mouth 2 (two) times daily.   [DISCONTINUED] Fluticasone-Umeclidin-Vilant (TRELEGY ELLIPTA) 200-62.5-25 MCG/INH AEPB Inhale 1 puff into the lungs daily.    Past Medical History:  Diagnosis Date   Abnormal LFTs 2005   Asthma    Recurrent upper respiratory infection (URI)    Upper GI bleed 2016   [place], [place]; presented with melena; admitted for 1 week; thought to be due to frequent Naproxen use   Vitamin D deficiency     Past Surgical History:  Procedure Laterality Date   BIOPSY  02/07/2018   Procedure: BIOPSY;  Surgeon: Thornton Park, MD;  Location: [facility] ENDOSCOPY;  Service: Gastroenterology;;   ESOPHAGOGASTRODUODENOSCOPY (EGD) WITH PROPOFOL N/A 02/07/2018   Procedure: ESOPHAGOGASTRODUODENOSCOPY (EGD) WITH PROPOFOL;  Surgeon: Thornton Park, MD;  Location: [facility];  Service: Gastroenterology;  Laterality: N/A;   NO PAST SURGERIES  Social:  Lives With: daughters Occupation: Education officer, museum Support: daughters  Level of Function: independent ADLs and IADLs PCP:  Rosezena Sensor, MD Substances: none  Family History:  Maternal grandmother -  breast cancer Paternal grandfather - colon cancer Great aunts cancer HLD - mom Paternal grandmother -  dm  Allergies: Allergies as of 12/30/2021 - Review Complete 12/30/2021  Allergen Reaction Noted   Aspirin Shortness Of Breath 02/01/2017    Review of Systems: A complete ROS was negative except  as per HPI.   OBJECTIVE:   Physical Exam: Blood pressure 131/71, pulse (!) 104, temperature 98.4 F (36.9 C), temperature source Oral, resp. rate (!) 31, height '5\' 6"'$  (1.676 m), weight 124.3 kg, SpO2 93 %.  Constitutional: well-appearing female sitting in bed, in no acute distress HENT: normocephalic atraumatic, mucous membranes moist Eyes: conjunctiva non-erythematous Neck: supple Cardiovascular: regular rate and rhythm, no m/r/g Pulmonary/Chest: rhonchi bilaterally, slightly diminished lung sounds left lobe Abdominal: soft, non-tender, non-distended MSK: normal bulk and tone Neurological: alert & oriented x 3, 5/5 strength in bilateral upper and lower extremities, normal gait Skin: warm and dry Psych: mood and affect appropriate  Labs: CBC    Component Value Date/Time   WBC 19.1 (H) 12/30/2021 1603   RBC 4.41 12/30/2021 1603   HGB 13.5 12/30/2021 1603   HGB 13.9 09/27/2019 1609   HCT 41.3 12/30/2021 1603   HCT 42.9 09/27/2019 1609   PLT 293 12/30/2021 1603   PLT 304 05/13/2017 1012   MCV 93.7 12/30/2021 1603   MCV 90 09/27/2019 1609   MCH 30.6 12/30/2021 1603   MCHC 32.7 12/30/2021 1603   RDW 13.1 12/30/2021 1603   RDW 12.7 09/27/2019 1609   LYMPHSABS 2.3 09/27/2019 1609   MONOABS 0.6 02/07/2018 0742   EOSABS 0.5 (H) 09/27/2019 1609   BASOSABS 0.1 09/27/2019 1609     CMP     Component Value Date/Time   NA 136 12/30/2021 0822   K 3.9 12/30/2021 0822   CL 100 12/30/2021 0822   CO2 22 12/30/2021 0822   GLUCOSE 146 (H) 12/30/2021 0822   BUN 9 12/30/2021 0822   CREATININE 0.80 12/30/2021 1603   CALCIUM 9.4 12/30/2021 0822   PROT 7.4 02/12/2018 0448   ALBUMIN 2.6 (L) 02/12/2018 0448   AST 82 (H) 02/12/2018 0448   ALT 91 (H) 02/12/2018 0448   ALKPHOS 504 (H) 02/12/2018 0448   BILITOT 2.2 (H) 02/12/2018 0448   GFRNONAA >60 12/30/2021 1603   GFRAA >60 02/12/2018 0448    Imaging: US SPLEEN (ABDOMEN LIMITED)  Result Date: 12/30/2021 CLINICAL DATA:  Splenic  cyst. EXAM: ULTRASOUND ABDOMEN LIMITED COMPARISON:  Chest CT dated 12/30/2021. FINDINGS: The spleen measures 10.4 x 2.1 x 2.8 cm. The rim calcified splenic lesion seen on the chest CT is not seen on ultrasound. Evaluation is limited due to overlying bowel gas. MRI may provide better characterization if clinically indicated. IMPRESSION: Nonvisualization of the splenic lesion on ultrasound. Electronically Signed   By: Anner Crete M.D.   On: 12/30/2021 18:22   DG Chest Port 1 View  Result Date: 12/30/2021 CLINICAL DATA:  [id number]; status post left chest tube insertion EXAM: PORTABLE CHEST 1 VIEW COMPARISON:  Chest x-ray on the same day earlier at 8:09 a.m. FINDINGS: There has been interval left chest tube insertion with the loop of the pigtail is at the left mid lung region. There has been interval decrease in the left pleural effusion. No pneumothorax. Again seen is the moderately decreased lung volumes.  Moderate bibasilar opacities on the basis of atelectasis without significant interval change. Again seen is the partially visualized rim calcified lesion at the left upper quadrant of the abdomen with differential considerations as described before. The visualized skeletal structures are unremarkable. IMPRESSION: 1. There has been interval insertion of left chest tube with interval resolution of left pleural effusion. No pneumothorax. 2. Moderate bibasilar atelectasis without significant interval change. Electronically Signed   By: Frazier Richards M.D.   On: 12/30/2021 17:56   CT Angio Chest PE W and/or Wo Contrast  Result Date: 12/30/2021 CLINICAL DATA:  PE suspected.  Positive D-dimer.  Chest pain. EXAM: CT ANGIOGRAPHY CHEST WITH CONTRAST TECHNIQUE: Multidetector CT imaging of the chest was performed using the standard protocol during bolus administration of intravenous contrast. Multiplanar CT image reconstructions and MIPs were obtained to evaluate the vascular anatomy. RADIATION DOSE REDUCTION: This  exam was performed according to the departmental dose-optimization program which includes automated exposure control, adjustment of the mA and/or kV according to patient size and/or use of iterative reconstruction technique. CONTRAST:  23m OMNIPAQUE IOHEXOL 350 MG/ML SOLN COMPARISON:  Same day chest radiograph at 0809 hours FINDINGS: Cardiovascular: Satisfactory opacification of the pulmonary arteries to the segmental level. No evidence of pulmonary embolism. Normal heart size. No pericardial effusion. Mediastinum/Nodes: No enlarged mediastinal, hilar, or axillary lymph nodes. Thyroid gland, trachea, and esophagus demonstrate no significant findings. Lungs/Pleura: Patchy peribronchovascular consolidations with surrounding ground-glass opacities in the right lower lobe large. Additional small patchy consolidation and atelectasis noted in the right middle lobe. Left pleural effusion with compressive atelectasis in the left lung base as well as in the left upper lobe. Upper Abdomen: A partially visualized 9.3 x 7.3 cm hypodense lesion with attenuation of 25 Hounsfield units and peripheral calcification is noted in the spleen (series 5, image 110; series 11, image 149). Additionally, a subtle 1.9 cm enhancing lesion is noted in the right hepatic lobe (series 5, image 114) as well as ill-defined tubular hypodensities within the left hepatic lobe (series 5, image 110). Musculoskeletal: No chest wall abnormality. No acute or significant osseous findings. Review of the MIP images confirms the above findings. IMPRESSION: 1. No pulmonary embolism. 2. Patchy airspace consolidations in the right middle and right lower lobe, consistent with multifocal pneumonia. 3. Large left pleural effusion with adjacent compressive atelectasis. 4. Partially visualized 9.3 cm hypodense lesion in the spleen with peripheral calcification. Differential diagnosis includes hydatid infection, lymphangioma, or vascular malformation. Recommend  splenic ultrasound for further evaluation. 5. Subtle 1.9 cm enhancing lesion in the right hepatic lobe as well as tubular hypodensities in the left hepatic lobe which may represent bile duct dilatation. When the patient is clinically stable and able to follow directions and hold their breath (preferably as an outpatient) further evaluation with dedicated abdominal MRI should be considered. Electronically Signed   By: LIleana RoupM.D.   On: 12/30/2021 13:10   DG Chest 2 View  Result Date: 12/30/2021 CLINICAL DATA:  Chest pain. Left-sided chest and posterior left shoulder pain. EXAM: CHEST - 2 VIEW COMPARISON:  Chest two views 10/20/2020, 05/01/2018; CT abdomen and pelvis 02/06/2018; report from MRI abdomen 02/06/2018 FINDINGS: Cardiac silhouette and mediastinal contours are within normal limits. There are moderately decreased lung volumes. Small bilateral pleural effusions. Bilateral basilar linear subsegmental atelectasis. No pneumothorax. There is peripherally calcified oval region within the left upper abdominal quadrant posteriorly, corresponding to the rim calcified dense fluid density lesion seen within the spleen on prior 02/06/2018 CT. Differential  considerations from prior cross-sectional imaging again include a chronic postinflammatory or posttraumatic (eg. chronic hematoma) lesion. This measures approximately 10 cm. IMPRESSION: 1. Low lung volumes with small bilateral pleural effusions and subsegmental atelectasis. It is difficult to exclude underlying pneumonia. 2. Chronic peripherally calcified left upper quadrant lesion seen on prior radiographs and cross-sectional images and favored to represent the sequela of remote trauma or infection. Electronically Signed   By: Yvonne Kendall M.D.   On: 12/30/2021 08:52    EKG: personally reviewed my interpretation is sinus tachycardia   ASSESSMENT & PLAN:    Assessment & Plan by Problem: Active Problems:   Recurrent left pleural effusion    Multifocal pneumonia   Pleural effusion   Vianey Caniglia is a 46 y.o. with pertinent PMH of asthma, chronic sinusitis, allergies, obesity who presented with chest pain and shortness of breath and admitted for CAP on hospital day 0  #CAP with effusion Chronic allergies with chronic green sputum production. Pleuritic chest pain and SHOB PTA. CT chest with patchy consolidations bilaterally with left pleural effusion and compressive atelectasis Chest tube placed by PCCM with return of murky fluid.  - follow up pleural fluid studies - sputum Cx - Strep urine antigen ordered - Ceftriaxone and azithromycin started - supplemental O2 as needed - pain control  #Splenic Cyst 9.3cm hypodense splenic lesion with peripheral calcification.  DDX includes hydatid infection, lymphangioma, vascular malformation. Asymptomatic. Unable to visualize with [place]. - consider dedicated abdominal MRI to better evaluate cyst and hepatic lesions.  # Hepatic lesions # Bile duct stricturing Follows with [facility] for chronically elevated transaminases for which she takes ursodiol. 1.9cm enhancing lesin R hepaic lobe and tubular hypodensities left hepatic lobe seen on CT.  - Dedicated abd mri recommended as an outpatient - Ursodiol  # Asthma No wheeze on exam. - albuterol, Breo ellipta, Incruse ellipta   Diet: Normal VTE: Enoxaparin IVF: None,None Code: Full Prior to Admission Living Arrangement: Home, living daughters Anticipated Discharge Location: Home Barriers to Discharge: medical stability Dispo: Admit patient to Inpatient with expected length of stay greater than 2 midnights.  Signed: Delene Ruffini, MD

## 2021-12-30 NOTE — ED Triage Notes (Signed)
Pt arrived POV from home c/o left sided pain under breast that radiates to her left arm and back. Pt also states it hurts to take a deep breath. Pt also endorses SHOB. Pt has a hx of asthma and has taken 3 breathing treatments today. Pt also states she feels fatigued.

## 2021-12-30 NOTE — Consult Note (Signed)
NAME:  Monique Harrison, MRN:  [id number], DOB:  07/16/1975, LOS: 0 ADMISSION DATE:  12/30/2021, CONSULTATION DATE:  12/30/21 REFERRING MD:  Daryll Drown, CHIEF COMPLAINT:  SOB/chest pain   History of Present Illness:  46 year old woman with history of chronic sinusitis, asthma presenting with 2 weeks of worsening DOE and left sided chest pain.  Pain occurs with inspiration.  MMRC 1.  Pain eased up initially then progressed so she came to ER.  Here noted to have RML PNA, L PNA and L effusion.  PCCM consulted for L effusion.  No pneumonia before.  +cough acute on chronic currently with green sputum.  Pertinent  Medical History  Asthma moderate persistent Chronic sinusitis  Significant Hospital Events: Including procedures, antibiotic start and stop dates in addition to other pertinent events   8/30 admit  Interim History / Subjective:  Consulted  Objective   Blood pressure 137/82, pulse (!) 119, temperature 98.4 F (36.9 C), temperature source Oral, resp. rate (!) 25, height '5\' 6"'$  (1.676 m), weight 124.3 kg, SpO2 91 %.        Intake/Output Summary (Last 24 hours) at 12/30/2021 1736 Last data filed at 12/30/2021 1557 Gross per 24 hour  Intake 250.43 ml  Output --  Net 250.43 ml   Filed Weights   12/30/21 0804  Weight: 124.3 kg    Examination: General: no distress HENT: MMM, trachea midline Lungs: diminished left base, R sounds okay with occasional rhonci Cardiovascular: tachycardic, ext warm Abdomen: soft, +BS Extremities: no edema Neuro: Moves all 4 ext to command Skin: no rashes  Resolved Hospital Problem list   N/A  Assessment & Plan:  CAP with left parapneumonic effusion vs. Empyema Moderate persistent asthma with allergic features - Thora vs. Pigtail on L, will determine intra-procedure. - Usual pleural studies - Sputum cx, pct - Ctx, azithromycin - She's had pretty significant eosinophilia in past and symptomatic still despite pretty good home asthma regimen; may  benefit from biologic eval as OP  - Will follow  Best Practice (right click and "Reselect all SmartList Selections" daily)  Per primary  Labs   CBC: Recent Labs  Lab 12/30/21 0822 12/30/21 1603  WBC 18.3* 19.1*  HGB 14.0 13.5  HCT 42.4 41.3  MCV 93.0 93.7  PLT 294 578    Basic Metabolic Panel: Recent Labs  Lab 12/30/21 0822 12/30/21 1603  NA 136  --   K 3.9  --   CL 100  --   CO2 22  --   GLUCOSE 146*  --   BUN 9  --   CREATININE 0.85 0.80  CALCIUM 9.4  --    GFR: Estimated Creatinine Clearance: 118.3 mL/min (by C-G formula based on SCr of 0.8 mg/dL). Recent Labs  Lab 12/30/21 0822 12/30/21 1603  WBC 18.3* 19.1*    Liver Function Tests: No results for input(s): "AST", "ALT", "ALKPHOS", "BILITOT", "PROT", "ALBUMIN" in the last 168 hours. No results for input(s): "LIPASE", "AMYLASE" in the last 168 hours. No results for input(s): "AMMONIA" in the last 168 hours.  ABG No results found for: "PHART", "PCO2ART", "PO2ART", "HCO3", "TCO2", "ACIDBASEDEF", "O2SAT"   Coagulation Profile: No results for input(s): "INR", "PROTIME" in the last 168 hours.  Cardiac Enzymes: No results for input(s): "CKTOTAL", "CKMB", "CKMBINDEX", "TROPONINI" in the last 168 hours.  HbA1C: Hgb A1c MFr Bld  Date/Time Value Ref Range Status  02/05/2018 02:01 PM 4.9 4.8 - 5.6 % Final    Comment:    (NOTE) Pre diabetes:  5.7%-6.4% Diabetes:              >6.4% Glycemic control for   <7.0% adults with diabetes     CBG: No results for input(s): "GLUCAP" in the last 168 hours.  Review of Systems:    Positive Symptoms in bold:  Constitutional fevers, chills, weight loss, fatigue, anorexia, malaise  Eyes decreased vision, double vision, eye irritation  Ears, Nose, Mouth, Throat sore throat, trouble swallowing, sinus congestion  Cardiovascular chest pain, paroxysmal nocturnal dyspnea, lower ext edema, palpitations   Respiratory SOB, cough, DOE, hemoptysis, wheezing   Gastrointestinal nausea, vomiting, diarrhea  Genitourinary burning with urination, trouble urinating  Musculoskeletal joint aches, joint swelling, back pain  Integumentary  rashes, skin lesions  Neurological focal weakness, focal numbness, trouble speaking, headaches  Psychiatric depression, anxiety, confusion  Endocrine polyuria, polydipsia, cold intolerance, heat intolerance  Hematologic abnormal bruising, abnormal bleeding, unexplained nose bleeds  Allergic/Immunologic recurrent infections, hives, swollen lymph nodes     Past Medical History:  She,  has a past medical history of Abnormal LFTs (2005), Asthma, Recurrent upper respiratory infection (URI), Upper GI bleed (2016), and Vitamin D deficiency.   Surgical History:   Past Surgical History:  Procedure Laterality Date   BIOPSY  02/07/2018   Procedure: BIOPSY;  Surgeon: Thornton Park, MD;  Location: [facility] ENDOSCOPY;  Service: Gastroenterology;;   ESOPHAGOGASTRODUODENOSCOPY (EGD) WITH PROPOFOL N/A 02/07/2018   Procedure: ESOPHAGOGASTRODUODENOSCOPY (EGD) WITH PROPOFOL;  Surgeon: Thornton Park, MD;  Location: [place];  Service: Gastroenterology;  Laterality: N/A;   NO PAST SURGERIES       Social History:   reports that she has never smoked. She has never used smokeless tobacco. She reports that she does not drink alcohol and does not use drugs.   Family History:  Her family history includes Allergic rhinitis in her father; Breast cancer in her maternal grandmother.   Allergies Allergies  Allergen Reactions   Aspirin Shortness Of Breath    Pt tolerates aspirin for occasional use.     Home Medications  Prior to Admission medications   Medication Sig Start Date End Date Taking? Authorizing Provider  albuterol (VENTOLIN HFA) 108 (90 Base) MCG/ACT inhaler Inhale 2 puffs into the lungs every 4 (four) hours as needed. Patient taking differently: Inhale 2 puffs into the lungs every 4 (four) hours as needed for wheezing  or shortness of breath. 12/16/21  Yes Padgett, Rae Halsted, MD  benzonatate (TESSALON PERLES) 100 MG capsule Take 1 capsule (100 mg total) by mouth 3 (three) times daily as needed for cough. 12/17/21  Yes Valentina Shaggy, MD  Cholecalciferol (VITAMIN D3) 125 MCG (5000 UT) CAPS Take 5,000 Units by mouth daily.   Yes [provider]  fexofenadine-pseudoephedrine (ALLEGRA-D 24) 180-240 MG 24 hr tablet Take 1 tablet by mouth daily as needed (allergies, sinus pressure).   Yes [provider]  Magnesium 250 MG TABS Take 1 tablet by mouth daily.   Yes [provider]  Probiotic Product (PROBIOTIC PO) Take 1 capsule by mouth daily. Dr. Levonne Lapping probiotic   Yes [provider]  sertraline (ZOLOFT) 50 MG tablet Take 50 mg by mouth daily. 12/30/21  Yes [provider]  Donnal Debar 200-62.5-25 MCG/ACT AEPB Inhale 1 puff into the lungs daily. 08/03/21  Yes Padgett, Rae Halsted, MD  ursodiol (ACTIGALL) 300 MG capsule Take 300 mg by mouth 2 (two) times daily. 11/25/20  Yes [provider]  azelastine (ASTELIN) 0.1 % nasal spray Place 2 sprays into both  nostrils 2 (two) times daily. Use in each nostril as directed Patient not taking: Reported on 12/30/2021 11/20/21   Kennith Gain, MD  fluticasone [facility]) 50 MCG/ACT nasal spray Place 2 sprays into both nostrils daily. Patient not taking: Reported on 12/30/2021 11/20/21   Kennith Gain, MD  ipratropium (ATROVENT) 0.06 % nasal spray Place 2 sprays into both nostrils 4 (four) times daily as needed (Nasal drainage). Patient not taking: Reported on 12/30/2021 07/22/21   Kennith Gain, MD  Olopatadine-Mometasone [facility]enn[facility] Plowman) [phone] MCG/ACT SUSP Place 2 sprays into both nostrils 2 (two) times daily. Patient not taking: Reported on 12/30/2021 07/22/21   Kennith Gain, MD     Critical care time: N/A

## 2021-12-30 NOTE — ED Notes (Signed)
Pt ambulated down hall and pulse ox 85% on RA, heartrate 120, provider notified. Pt states she felt very sob.

## 2021-12-30 NOTE — ED Provider Notes (Signed)
[facility] EMERGENCY DEPARTMENT Provider Note   CSN: [id number] Arrival date & time: 12/30/21  9983     History  Chief Complaint  Patient presents with   Chest Pain    Monique Harrison is a 46 y.o. female.   Chest Pain Patient presents with chest pain and shortness of breath.  Has had for the last few days.  States she had a few days off for vacation.  States she was hurting before that went away during vacation and now is come back.  Shortness of breath.  Asthma history.  Coughing.  States she feels wheezy.  Has been coughing up green sputum.  No swelling in her legs.  Does have pain in her left shoulder that goes down to the left chest.  Not worse with exertion but pain is worse with breathing.    Past Medical History:  Diagnosis Date   Abnormal LFTs 2005   Asthma    Recurrent upper respiratory infection (URI)    Upper GI bleed 2016   [place], [place]; presented with melena; admitted for 1 week; thought to be due to frequent Naproxen use   Vitamin D deficiency     Home Medications Prior to Admission medications   Medication Sig Start Date End Date Taking? Authorizing Provider  albuterol (PROVENTIL) (2.5 MG/3ML) 0.083% nebulizer solution Can use one vial in nebulizer every four to six hours as needed for cough/wheeze/shortness of breath/chest tightness. 10/20/20   Henderly, Britni A, PA-C  albuterol (VENTOLIN HFA) 108 (90 Base) MCG/ACT inhaler Inhale 2 puffs into the lungs every 4 (four) hours as needed. 12/16/21   Kennith Gain, MD  Ascorbic Acid (VITAMIN C PO) Take by mouth.    [provider]  azelastine (ASTELIN) 0.1 % nasal spray Place 2 sprays into both nostrils 2 (two) times daily. Use in each nostril as directed 11/20/21   Kennith Gain, MD  Azelastine-Fluticasone 137-50 MCG/ACT SUSP Place 1 spray into both nostrils 2 (two) times daily. 10/24/20   [provider]  benzonatate (TESSALON PERLES) 100 MG capsule Take  1 capsule (100 mg total) by mouth 3 (three) times daily as needed for cough. 12/17/21   Valentina Shaggy, MD  Cetirizine-Pseudoephedrine (ZYRTEC-D PO) Take by mouth.    [provider]  Cholecalciferol (VITAMIN D3) 3000 units TABS Take 3,000 Units by mouth daily.    [provider]  Cholecalciferol 25 MCG (1000 UT) tablet Take by mouth.    [provider]  Cyanocobalamin (B-12 PO) Take by mouth.    [provider]  Fexofenadine-Pseudoephedrine (ALLEGRA-D PO) Take by mouth.    [provider]  fluticasone (FLONASE) 50 MCG/ACT nasal spray Place 2 sprays into both nostrils daily. 11/20/21   Kennith Gain, MD  Fluticasone-Umeclidin-Vilant (TRELEGY ELLIPTA) 200-62.5-25 MCG/INH AEPB Inhale 1 puff into the lungs daily. 02/03/21   Kennith Gain, MD  ipratropium (ATROVENT) 0.06 % nasal spray Place 2 sprays into both nostrils 4 (four) times daily as needed (Nasal drainage). 07/22/21   Kennith Gain, MD  ipratropium-albuterol (DUONEB) 0.5-2.5 (3) MG/3ML SOLN Take 3 mLs by nebulization every 4 (four) hours as needed. 10/20/20   Henderly, Britni A, PA-C  olopatadine (PATANOL) 0.1 % ophthalmic solution Place 1 drop into both eyes 2 (two) times daily as needed for allergies (itchy eyes). 02/19/19   Garnet Sierras, DO  Olopatadine-Mometasone (RYALTRIS) [phone] MCG/ACT SUSP Place 2 sprays into both nostrils 2 (two) times daily. 07/22/21   Prudy Feeler  Mardene Celeste, MD  sertraline (ZOLOFT) 50 MG tablet Take 50 mg by mouth daily. 12/30/21   [provider]  Donnal Debar 200-62.5-25 MCG/ACT AEPB Inhale 1 puff into the lungs daily. 08/03/21   Kennith Gain, MD  ursodiol (ACTIGALL) 300 MG capsule Take 300 mg by mouth 2 (two) times daily. 11/25/20   [provider]      Allergies    Aspirin    Review of Systems   Review of Systems  Cardiovascular:  Positive for chest pain.    Physical Exam Updated Vital  Signs BP 131/71   Pulse (!) 104   Temp 98.4 F (36.9 C) (Oral)   Resp (!) 31   Ht '5\' 6"'$  (1.676 m)   Wt 124.3 kg   SpO2 93%   BMI 44.22 kg/m  Physical Exam Vitals and nursing note reviewed.  Cardiovascular:     Rate and Rhythm: Regular rhythm. Tachycardia present.  Pulmonary:     Comments: Somewhat diffuse harsh breath sounds. Chest:     Chest wall: Tenderness present.     Comments: Tenderness to palpation of anterior chest on the left side. Abdominal:     Tenderness: There is no abdominal tenderness.  Musculoskeletal:     Cervical back: Normal range of motion.     Right lower leg: No edema.     Left lower leg: No edema.  Neurological:     Mental Status: She is alert.     ED Results / Procedures / Treatments   Labs (all labs ordered are listed, but only abnormal results are displayed) Labs Reviewed  BASIC METABOLIC PANEL - Abnormal; Notable for the following components:      Result Value   Glucose, Bld 146 (*)    All other components within normal limits  CBC - Abnormal; Notable for the following components:   WBC 18.3 (*)    All other components within normal limits  D-DIMER, QUANTITATIVE - Abnormal; Notable for the following components:   D-Dimer, Quant 4.29 (*)    All other components within normal limits  SARS CORONAVIRUS 2 BY RT PCR  I-STAT BETA HCG BLOOD, ED ([facility], [facility], [facility] ONLY)  TROPONIN I (HIGH SENSITIVITY)  TROPONIN I (HIGH SENSITIVITY)    EKG EKG Interpretation  Date/Time:  Wednesday December 30 2021 08:06:03 EDT Ventricular Rate:  112 PR Interval:  122 QRS Duration: 88 QT Interval:  312 QTC Calculation: 425 R Axis:   15 Text Interpretation: Sinus tachycardia Possible Inferior infarct , age undetermined Possible Anterolateral infarct , age undetermined Abnormal ECG When compared with ECG of 05-Feb-2018 21:14,  rate increased. Confirmed by Davonna Belling [id number]) on 12/30/2021 8:31:52 AM  Radiology CT Angio Chest PE W and/or Wo Contrast  Result  Date: 12/30/2021 CLINICAL DATA:  PE suspected.  Positive D-dimer.  Chest pain. EXAM: CT ANGIOGRAPHY CHEST WITH CONTRAST TECHNIQUE: Multidetector CT imaging of the chest was performed using the standard protocol during bolus administration of intravenous contrast. Multiplanar CT image reconstructions and MIPs were obtained to evaluate the vascular anatomy. RADIATION DOSE REDUCTION: This exam was performed according to the departmental dose-optimization program which includes automated exposure control, adjustment of the mA and/or kV according to patient size and/or use of iterative reconstruction technique. CONTRAST:  60m OMNIPAQUE IOHEXOL 350 MG/ML SOLN COMPARISON:  Same day chest radiograph at 0809 hours FINDINGS: Cardiovascular: Satisfactory opacification of the pulmonary arteries to the segmental level. No evidence of pulmonary embolism. Normal heart size. No pericardial effusion. Mediastinum/Nodes: No enlarged mediastinal, hilar,  or axillary lymph nodes. Thyroid gland, trachea, and esophagus demonstrate no significant findings. Lungs/Pleura: Patchy peribronchovascular consolidations with surrounding ground-glass opacities in the right lower lobe large. Additional small patchy consolidation and atelectasis noted in the right middle lobe. Left pleural effusion with compressive atelectasis in the left lung base as well as in the left upper lobe. Upper Abdomen: A partially visualized 9.3 x 7.3 cm hypodense lesion with attenuation of 25 Hounsfield units and peripheral calcification is noted in the spleen (series 5, image 110; series 11, image 149). Additionally, a subtle 1.9 cm enhancing lesion is noted in the right hepatic lobe (series 5, image 114) as well as ill-defined tubular hypodensities within the left hepatic lobe (series 5, image 110). Musculoskeletal: No chest wall abnormality. No acute or significant osseous findings. Review of the MIP images confirms the above findings. IMPRESSION: 1. No pulmonary  embolism. 2. Patchy airspace consolidations in the right middle and right lower lobe, consistent with multifocal pneumonia. 3. Large left pleural effusion with adjacent compressive atelectasis. 4. Partially visualized 9.3 cm hypodense lesion in the spleen with peripheral calcification. Differential diagnosis includes hydatid infection, lymphangioma, or vascular malformation. Recommend splenic ultrasound for further evaluation. 5. Subtle 1.9 cm enhancing lesion in the right hepatic lobe as well as tubular hypodensities in the left hepatic lobe which may represent bile duct dilatation. When the patient is clinically stable and able to follow directions and hold their breath (preferably as an outpatient) further evaluation with dedicated abdominal MRI should be considered. Electronically Signed   By: Ileana Roup M.D.   On: 12/30/2021 13:10   DG Chest 2 View  Result Date: 12/30/2021 CLINICAL DATA:  Chest pain. Left-sided chest and posterior left shoulder pain. EXAM: CHEST - 2 VIEW COMPARISON:  Chest two views 10/20/2020, 05/01/2018; CT abdomen and pelvis 02/06/2018; report from MRI abdomen 02/06/2018 FINDINGS: Cardiac silhouette and mediastinal contours are within normal limits. There are moderately decreased lung volumes. Small bilateral pleural effusions. Bilateral basilar linear subsegmental atelectasis. No pneumothorax. There is peripherally calcified oval region within the left upper abdominal quadrant posteriorly, corresponding to the rim calcified dense fluid density lesion seen within the spleen on prior 02/06/2018 CT. Differential considerations from prior cross-sectional imaging again include a chronic postinflammatory or posttraumatic (eg. chronic hematoma) lesion. This measures approximately 10 cm. IMPRESSION: 1. Low lung volumes with small bilateral pleural effusions and subsegmental atelectasis. It is difficult to exclude underlying pneumonia. 2. Chronic peripherally calcified left upper quadrant  lesion seen on prior radiographs and cross-sectional images and favored to represent the sequela of remote trauma or infection. Electronically Signed   By: Yvonne Kendall M.D.   On: 12/30/2021 08:52    Procedures Procedures    Medications Ordered in ED Medications  cefTRIAXone (ROCEPHIN) 2 g in sodium chloride 0.9 % 100 mL IVPB (2 g Intravenous New Bag/Given 12/30/21 1346)  azithromycin (ZITHROMAX) 500 mg in sodium chloride 0.9 % 250 mL IVPB (has no administration in time range)  fentaNYL (SUBLIMAZE) injection 50 mcg (50 mcg Intravenous Given 12/30/21 1213)  iohexol (OMNIPAQUE) 350 MG/ML injection 50 mL (50 mLs Intravenous Contrast Given 12/30/21 1238)    ED Course/ Medical Decision Making/ A&P                           Medical Decision Making Amount and/or Complexity of Data Reviewed Labs: ordered. Radiology: ordered.  Risk Prescription drug management. Decision regarding hospitalization.   Patient with shortness of breath cough and  chest pain.  Differential diagnoses along with includes life-threatening condition such as pulmonary embolism, coronary artery disease, pneumonia, pneumothorax.  X-ray independently interpreted and does show no clear pneumonia although potentially I see something at the left midlung field.  No pneumothorax. Will get blood work including a D-dimer I think she is higher risk with pleuritic chest pain and tachycardia. D-dimer was elevated and CT angiography was done.  Independently interpreted the CT and it does show pneumonia on the right side and a large pleural effusion on the left.  Ambulated patient.  There is hypoxia with ambulation with sats down in the 80s.  With new hypoxia I feel she would benefit from admission to the hospital.  Will give antibiotics.  Will discuss with unassigned medicine.          Final Clinical Impression(s) / ED Diagnoses Final diagnoses:  Community acquired pneumonia of right lung, unspecified part of lung  Pleural  effusion  Hypoxia    Rx / DC Orders ED Discharge Orders     None         Davonna Belling, MD 12/30/21 1407

## 2021-12-31 ENCOUNTER — Inpatient Hospital Stay (HOSPITAL_COMMUNITY): Payer: BC Managed Care – PPO

## 2021-12-31 DIAGNOSIS — J90 Pleural effusion, not elsewhere classified: Secondary | ICD-10-CM | POA: Diagnosis not present

## 2021-12-31 DIAGNOSIS — J188 Other pneumonia, unspecified organism: Secondary | ICD-10-CM | POA: Diagnosis not present

## 2021-12-31 DIAGNOSIS — J45909 Unspecified asthma, uncomplicated: Secondary | ICD-10-CM | POA: Diagnosis not present

## 2021-12-31 DIAGNOSIS — Z4682 Encounter for fitting and adjustment of non-vascular catheter: Secondary | ICD-10-CM | POA: Diagnosis not present

## 2021-12-31 DIAGNOSIS — K769 Liver disease, unspecified: Secondary | ICD-10-CM

## 2021-12-31 DIAGNOSIS — J918 Pleural effusion in other conditions classified elsewhere: Secondary | ICD-10-CM

## 2021-12-31 DIAGNOSIS — D734 Cyst of spleen: Secondary | ICD-10-CM

## 2021-12-31 DIAGNOSIS — J189 Pneumonia, unspecified organism: Secondary | ICD-10-CM | POA: Diagnosis not present

## 2021-12-31 LAB — HEPATIC FUNCTION PANEL
ALT: 24 U/L (ref 0–44)
AST: 23 U/L (ref 15–41)
Albumin: 2.7 g/dL — ABNORMAL LOW (ref 3.5–5.0)
Alkaline Phosphatase: 222 U/L — ABNORMAL HIGH (ref 38–126)
Bilirubin, Direct: 0.4 mg/dL — ABNORMAL HIGH (ref 0.0–0.2)
Indirect Bilirubin: 0.6 mg/dL (ref 0.3–0.9)
Total Bilirubin: 1 mg/dL (ref 0.3–1.2)
Total Protein: 7.4 g/dL (ref 6.5–8.1)

## 2021-12-31 LAB — BASIC METABOLIC PANEL
Anion gap: 8 (ref 5–15)
BUN: 6 mg/dL (ref 6–20)
CO2: 25 mmol/L (ref 22–32)
Calcium: 9.1 mg/dL (ref 8.9–10.3)
Chloride: 105 mmol/L (ref 98–111)
Creatinine, Ser: 0.73 mg/dL (ref 0.44–1.00)
GFR, Estimated: >60 mL/min (ref >60)
Glucose, Bld: 105 mg/dL — ABNORMAL HIGH (ref 70–99)
Potassium: 4.7 mmol/L (ref 3.5–5.1)
Sodium: 138 mmol/L (ref 135–145)

## 2021-12-31 LAB — CBC WITH DIFFERENTIAL/PLATELET
Abs Immature Granulocytes: 0.07 10*3/uL (ref 0.00–0.07)
Basophils Absolute: 0.1 10*3/uL (ref 0.0–0.1)
Basophils Relative: 0 %
Eosinophils Absolute: 0 10*3/uL (ref 0.0–0.5)
Eosinophils Relative: 0 %
HCT: 38.1 % (ref 36.0–46.0)
Hemoglobin: 12.9 g/dL (ref 12.0–15.0)
Immature Granulocytes: 0 %
Lymphocytes Relative: 10 %
Lymphs Abs: 1.8 10*3/uL (ref 0.7–4.0)
MCH: 30.8 pg (ref 26.0–34.0)
MCHC: 33.9 g/dL (ref 30.0–36.0)
MCV: 90.9 fL (ref 80.0–100.0)
Monocytes Absolute: 1.1 10*3/uL — ABNORMAL HIGH (ref 0.1–1.0)
Monocytes Relative: 6 %
Neutro Abs: 14.5 10*3/uL — ABNORMAL HIGH (ref 1.7–7.7)
Neutrophils Relative %: 84 %
Platelets: 297 10*3/uL (ref 150–400)
RBC: 4.19 MIL/uL (ref 3.87–5.11)
RDW: 13.1 % (ref 11.5–15.5)
WBC: 17.5 10*3/uL — ABNORMAL HIGH (ref 4.0–10.5)
nRBC: 0 % (ref 0.0–0.2)

## 2021-12-31 MED ORDER — OXYCODONE HCL 5 MG PO TABS
5.0000 mg | ORAL_TABLET | ORAL | Status: DC | PRN
  Administered 2021-12-31 – 2022-01-03 (×12): 5 mg via ORAL
  Filled 2021-12-31 (×12): qty 1

## 2021-12-31 MED ORDER — STERILE WATER FOR INJECTION IJ SOLN
5.0000 mg | Freq: Once | RESPIRATORY_TRACT | Status: AC
  Administered 2021-12-31: 5 mg via INTRAPLEURAL
  Filled 2021-12-31: qty 5

## 2021-12-31 MED ORDER — SODIUM CHLORIDE (PF) 0.9 % IJ SOLN
10.0000 mg | Freq: Once | INTRAMUSCULAR | Status: AC
  Administered 2021-12-31: 10 mg via INTRAPLEURAL
  Filled 2021-12-31: qty 10

## 2021-12-31 MED ORDER — LIDOCAINE 5 % EX PTCH
1.0000 | MEDICATED_PATCH | CUTANEOUS | Status: DC
Start: 2021-12-31 — End: 2022-01-04
  Administered 2021-12-31 – 2022-01-03 (×4): 1 via TRANSDERMAL
  Filled 2021-12-31 (×4): qty 1

## 2021-12-31 MED ORDER — IPRATROPIUM-ALBUTEROL 0.5-2.5 (3) MG/3ML IN SOLN
3.0000 mL | RESPIRATORY_TRACT | Status: DC | PRN
Start: 2021-12-31 — End: 2022-01-04
  Administered 2021-12-31 – 2022-01-03 (×7): 3 mL via RESPIRATORY_TRACT
  Filled 2021-12-31 (×7): qty 3

## 2021-12-31 MED ORDER — FENTANYL CITRATE PF 50 MCG/ML IJ SOSY: 50.0000 ug | PREFILLED_SYRINGE | INTRAMUSCULAR | Status: DC | PRN

## 2021-12-31 MED ORDER — FENTANYL CITRATE PF 50 MCG/ML IJ SOSY
100.0000 ug | PREFILLED_SYRINGE | INTRAMUSCULAR | Status: DC | PRN
  Administered 2021-12-31 – 2022-01-03 (×8): 100 ug via INTRAVENOUS
  Filled 2021-12-31 (×8): qty 2

## 2021-12-31 NOTE — Procedures (Signed)
Pleural Fibrinolytic Administration Procedure Note  Monique Harrison  [id number]  Jan 20, 1976  Date:12/31/21  Time:12:37 PM   Provider Performing:Ligia Duguay P Carlis Abbott   Procedure: Pleural Fibrinolysis Initial day [id number])  Indication(s) Fibrinolysis of complicated pleural effusion  Consent Risks of the procedure as well as the alternatives and risks of each were explained to the patient and/or caregiver.  Verbal consent for the procedure was obtained.   Anesthesia None   Time Out Verified patient identification, verified procedure, site/side was marked, verified correct patient position, special equipment/implants available, medications/allergies/relevant history reviewed, required imaging and test results available.   Sterile Technique Hand hygiene, gloves   Procedure Description Existing pleural catheter was cleaned and accessed in sterile manner.  '10mg'$  of tPA in 30cc of saline and '5mg'$  of dornase in 30cc of sterile water were injected into pleural space using existing pleural catheter. She only tolerated this with very slow administration due to pain despite premedication with fentanyl. Catheter will be clamped for 1 hour and then placed back to suction. Meds to be released by RN at 1:25 PM; d/w [name], RN.   Complications/Tolerance None; patient tolerated the procedure well.   EBL None   Specimen(s) None  Julian Hy, DO 12/31/21 12:37 PM [facility]

## 2021-12-31 NOTE — Hospital Course (Addendum)
8/31: Patient states that her chest pain has worsened this morning. She states that her shortness of breath has also worsened this morning. She states that she has been coughing up yellow sputum. Patient also notes a mild headache. She states that she was able to eat some breakfast. Patient states that she was hospitalized in 2019 for an ulcer where they first noticed her abnormal spleen findings on imaging. Discussed plan to follow up on cultures. She states that the fentanyl ordered is not improving her pain effectively. Discussed plan to improve pain regimen. Discussed plan to keep chest tube in for now.   ------------------- 9/1: Patient states that she is coughing up mostly yellow sputum with some streaks of blood. She states that her breathing has improved today and that her pain has improved. She states that she has been using her incentive spirometer but not yet today due to her sleepiness. She states that she does not still have a great appetite but that she has been trying to eat as much as she can. Discussed plan to discuss possibility of lung biopsy with pulm.  -------------------------

## 2021-12-31 NOTE — Progress Notes (Signed)
Ms. Lem is still having uncontrolled pain with fibrinolytic administration despite fentanyl pre-medication. She is unfortunately intolerant to NSAIDs due to history of GIB. Will schedule tylenol, increase fentanyl dose. She will need pre-medication before fibrinolytic administration. Currently no concerns for respiratory suppression, but need to continue to watch for this on the higher dose.  Julian Hy, DO 12/31/21 12:41 PM [facility]

## 2021-12-31 NOTE — Progress Notes (Addendum)
HD#1 SUBJECTIVE:  Patient Summary: Monique Harrison is a 46 y.o. with pertinent PMH of asthma, chronic sinusitis, allergies, obesity who presented with chest pain and shortness of breath and admitted for CAP   Overnight Events:  Chest tube placed yesterday    Interm History:  Feeling more short of breath this morning. Doesn't believe it is related to known pleuritic pain. Does not think she is feeling anxious. Does endorse back pain where chest tube is. Sputum color has changed form green to yellow. Has been able to eat.   OBJECTIVE:  Vital Signs: Blood pressure (!) 124/90, pulse (!) 104, temperature 99 F (37.2 C), temperature source Oral, resp. rate 20, height '5\' 6"'$  (1.676 m), weight 122.5 kg, SpO2 94 %.  Vitals:   12/31/21 0446 12/31/21 0511 12/31/21 0803 12/31/21 1157  BP:   117/76 (!) 124/90  Pulse: (!) 102  99 (!) 104  Resp: 20  (!) 23 20  Temp:   98.8 F (37.1 C) 99 F (37.2 C)  TempSrc:   Oral Oral  SpO2: 92% 90% 94% 94%  Weight:      Height:       Supplemental O2:  SpO2: 94 % O2 Flow Rate (L/min): 3 L/min  Filed Weights   12/30/21 0804 12/30/21 2222  Weight: 124.3 kg 122.5 kg     Intake/Output Summary (Last 24 hours) at 12/31/2021 1502 Last data filed at 12/31/2021 0700 Gross per 24 hour  Intake 730.43 ml  Output 70 ml  Net 660.43 ml   Net IO Since Admission: 660.43 mL [12/31/21 1502]  Physical Exam: Constitutional: well-appearing female sitting in bed, in no acute distress HENT: normocephalic atraumatic, mucous membranes moist Eyes: conjunctiva non-erythematous Neck: supple Cardiovascular: regular rate and rhythm, no m/r/g Pulmonary/Chest: rhonchi bilaterally, slightly diminished lung sounds left lobe. Chest tube in place with straw colored fluid in container.  Abdominal: soft, non-tender, non-distended MSK: normal bulk and tone Neurological: alert & oriented x 3, 5/5 strength in bilateral upper and lower extremities, normal gait Skin: warm and  dry Psych: mood and affect appropriate  Patient Lines/Drains/Airways Status     Active Line/Drains/Airways     Name Placement date Placement time Site Days   Peripheral IV 12/30/21 20 G 1" Right Antecubital 12/30/21  1005  Antecubital  1   Chest Tube 1 Left Pleural 12/30/21  1703  Pleural  1   Incision (Closed) 12/30/21 Back Lateral;Left 12/30/21  2230  -- 1            Pertinent Labs:    Latest Ref Rng & Units 12/31/2021    2:19 AM 12/30/2021    4:03 PM 12/30/2021    8:22 AM  CBC  WBC 4.0 - 10.5 K/uL 17.5  19.1  18.3   Hemoglobin 12.0 - 15.0 g/dL 12.9  13.5  14.0   Hematocrit 36.0 - 46.0 % 38.1  41.3  42.4   Platelets 150 - 400 K/uL 297  293  294        Latest Ref Rng & Units 12/31/2021    2:19 AM 12/30/2021    4:03 PM 12/30/2021    8:22 AM  CMP  Glucose 70 - 99 mg/dL 105   146   BUN 6 - 20 mg/dL 6   9   Creatinine 0.44 - 1.00 mg/dL 0.73  0.80  0.85   Sodium 135 - 145 mmol/L 138   136   Potassium 3.5 - 5.1 mmol/L 4.7   3.9  Chloride 98 - 111 mmol/L 105   100   CO2 22 - 32 mmol/L 25   22   Calcium 8.9 - 10.3 mg/dL 9.1   9.4   Total Protein 6.5 - 8.1 g/dL 7.4     Total Bilirubin 0.3 - 1.2 mg/dL 1.0     Alkaline Phos 38 - 126 U/L 222     AST 15 - 41 U/L 23     ALT 0 - 44 U/L 24       No results for input(s): "GLUCAP" in the last 72 hours.   Pertinent Imaging: DG CHEST PORT 1 VIEW  Result Date: 12/31/2021 CLINICAL DATA:  Left chest and arm pain, difficulty with deep breathing EXAM: PORTABLE CHEST 1 VIEW COMPARISON:  12/31/2021 FINDINGS: Small bore pigtail chest drain projects over the left midlung laterally. No definite enlarging effusion or significant pneumothorax by portable radiography. Lower lung volumes with increased basilar opacities compatible with atelectasis or consolidation. Difficult to exclude basilar pneumonia. Cardiac silhouette is obscured but does appear enlarged. Trachea midline. No significant osseous finding. IMPRESSION: Similar low lung volumes  with increased basilar atelectasis versus pneumonia. No enlarging effusion or pneumothorax. Overall stable left chest tube. Electronically Signed   By: Jerilynn Mages.  Shick M.D.   On: 12/31/2021 14:06   DG Chest Port 1 View  Result Date: 12/31/2021 CLINICAL DATA:  [id number]; chest tube in place, history of asthma EXAM: PORTABLE CHEST 1 VIEW COMPARISON:  Chest x-ray from yesterday FINDINGS: Again seen is the left-sided chest drain. There is no pneumothorax or significant residual pleural effusion. Low lung volumes. Moderate bibasilar atelectasis without significant interval change. Upper lung fields remain clear. Cardiopericardial silhouette is stable. The visualized skeletal structures are unremarkable. IMPRESSION: Left-sided chest tube is stable. No pneumothorax or residual pleural effusion. Low lung volumes with moderate bibasilar atelectasis without significant interval change. Electronically Signed   By: Frazier Richards M.D.   On: 12/31/2021 08:07   US SPLEEN (ABDOMEN LIMITED)  Result Date: 12/30/2021 CLINICAL DATA:  Splenic cyst. EXAM: ULTRASOUND ABDOMEN LIMITED COMPARISON:  Chest CT dated 12/30/2021. FINDINGS: The spleen measures 10.4 x 2.1 x 2.8 cm. The rim calcified splenic lesion seen on the chest CT is not seen on ultrasound. Evaluation is limited due to overlying bowel gas. MRI may provide better characterization if clinically indicated. IMPRESSION: Nonvisualization of the splenic lesion on ultrasound. Electronically Signed   By: Anner Crete M.D.   On: 12/30/2021 18:22   DG Chest Port 1 View  Result Date: 12/30/2021 CLINICAL DATA:  [id number]; status post left chest tube insertion EXAM: PORTABLE CHEST 1 VIEW COMPARISON:  Chest x-ray on the same day earlier at 8:09 a.m. FINDINGS: There has been interval left chest tube insertion with the loop of the pigtail is at the left mid lung region. There has been interval decrease in the left pleural effusion. No pneumothorax. Again seen is the moderately decreased  lung volumes. Moderate bibasilar opacities on the basis of atelectasis without significant interval change. Again seen is the partially visualized rim calcified lesion at the left upper quadrant of the abdomen with differential considerations as described before. The visualized skeletal structures are unremarkable. IMPRESSION: 1. There has been interval insertion of left chest tube with interval resolution of left pleural effusion. No pneumothorax. 2. Moderate bibasilar atelectasis without significant interval change. Electronically Signed   By: Frazier Richards M.D.   On: 12/30/2021 17:56    ASSESSMENT/PLAN:  Assessment: Active Problems:   Recurrent left pleural effusion  Multifocal pneumonia   Pleural effusion   Lucilia Yanni is a 46 y.o. with pertinent PMH of asthma, chronic sinusitis, allergies, obesity who presented with chest pain and shortness of breath and admitted for CAP on hospital day 1  #CAP with exudative effusion Chronic allergies with chronic green sputum production. Pleuritic chest pain and SHOB PTA. CT chest with patchy consolidations bilaterally with left pleural effusion and compressive atelectasis. Chest tube placed by PCCM with return of murky fluid. Pleural fluid exudative. Sputum and fluid cultures have been negative thus far. Given her hx of asthma, allergic rhinitis, past eosinophilia, and GIB, [place] is possible, though ANCA titers have been drawn in the past making this less likely.  - Continue to follow cultures - Ceftriaxone and azithromycin  - supplemental O2 as needed - increased pain control to include oxycodone. Need to avoid NSAIDs d/t hx GIB.  - chest tube management per PCCM - IS and flutter valve to facilitate lung hygiene   #Splenic Cyst 9.3cm hypodense splenic lesion with peripheral calcification.  DDX includes hydatid infection, lymphangioma, vascular malformation. Parasitic etiology most common for splenic cysts. This may explain hx of  eosinophilia as well.  Asymptomatic. Unable to visualize with [place]. - consider dedicated abdominal MRI to better evaluate cyst and hepatic lesions.   # Hepatic lesions # Bile duct stricturing Follows with [facility] for chronically elevated transaminases for which she takes ursodiol. 1.9cm enhancing lesin R hepaic lobe and tubular hypodensities left hepatic lobe seen on CT.  - Dedicated abd mri recommended as an outpatient - Ursodiol   # Asthma No wheeze on exam. - albuterol, Breo ellipta, Incruse ellipta - will need pulmonology f/u outpatient due to poorly controlled asthma   Diet: Normal VTE: Enoxaparin IVF: None,None Code: Full Prior to Admission Living Arrangement: Home, living daughters Anticipated Discharge Location: Home Barriers to Discharge: medical stability Dispo: Admit patient to Inpatient with expected length of stay greater than 2 midnights.   Signed: Delene Ruffini, MD

## 2021-12-31 NOTE — Progress Notes (Signed)
   NAME:  Monique Harrison, MRN:  [id number], DOB:  11-15-1975, LOS: 1 ADMISSION DATE:  12/30/2021, CONSULTATION DATE:  12/30/21 REFERRING MD:  Daryll Drown, CHIEF COMPLAINT:  SOB/chest pain   History of Present Illness:  46 year old woman with history of chronic sinusitis, asthma presenting with 2 weeks of worsening DOE and left sided chest pain.  Pain occurs with inspiration.  MMRC 1.  Pain eased up initially then progressed so she came to ER.  Here noted to have RML PNA, L PNA and L effusion.  PCCM consulted for L effusion.  No pneumonia before.  +cough acute on chronic currently with green sputum.  Pertinent  Medical History  Asthma moderate persistent Chronic sinusitis  Significant Hospital Events: Including procedures, antibiotic start and stop dates in addition to other pertinent events   8/30 admit, left chest tube; left pleural fluid culture sent, started on ceftriaxone and azithro  Interim History / Subjective:  Consulted  Objective   Blood pressure 117/76, pulse 99, temperature 98.8 F (37.1 C), temperature source Oral, resp. rate (!) 23, height '5\' 6"'$  (1.676 m), weight 122.5 kg, SpO2 94 %.        Intake/Output Summary (Last 24 hours) at 12/31/2021 1032 Last data filed at 12/31/2021 0700 Gross per 24 hour  Intake 730.43 ml  Output 70 ml  Net 660.43 ml   Filed Weights   12/30/21 0804 12/30/21 2222  Weight: 124.3 kg 122.5 kg    Examination: General:  Obese, Resting comfortably in bed, cough HENT: NCAT OP clear PULM: Rhonchi bilaterally, normal effort CV: RRR, no mgr GI: BS+, soft, nontender MSK: normal bulk and tone Neuro: awake, alert, no distress, MAEW   Resolved Hospital Problem list   N/A  Assessment & Plan:  CAP with left parapneumonic effusion vs. Empyema > POC ultrasound on 8/31 shows persistent dependent pocket of fluid in mid-axillary line Moderate persistent asthma with allergic features - continue chest tube to suction with saline flush per protocol -  continue antibiotics as you are doing  - will add dornase alpha and TPA today to see if we can get the rest of the fluid collection out - will ultimately need pulmonary follow up as outpatient for poorly controlled asthma  Best Practice (right click and "Reselect all SmartList Selections" daily)  Per primary   Roselie Awkward, MD [facility] Pager: [phone] Cell: [phone] After 7:00 pm call Elink  [phone]

## 2022-01-01 ENCOUNTER — Inpatient Hospital Stay (HOSPITAL_COMMUNITY): Payer: BC Managed Care – PPO

## 2022-01-01 DIAGNOSIS — J45909 Unspecified asthma, uncomplicated: Secondary | ICD-10-CM | POA: Diagnosis not present

## 2022-01-01 DIAGNOSIS — J188 Other pneumonia, unspecified organism: Secondary | ICD-10-CM | POA: Diagnosis not present

## 2022-01-01 DIAGNOSIS — J918 Pleural effusion in other conditions classified elsewhere: Secondary | ICD-10-CM | POA: Diagnosis not present

## 2022-01-01 DIAGNOSIS — J189 Pneumonia, unspecified organism: Secondary | ICD-10-CM | POA: Diagnosis not present

## 2022-01-01 DIAGNOSIS — D734 Cyst of spleen: Secondary | ICD-10-CM | POA: Diagnosis not present

## 2022-01-01 DIAGNOSIS — J90 Pleural effusion, not elsewhere classified: Secondary | ICD-10-CM | POA: Diagnosis not present

## 2022-01-01 LAB — CBC
HCT: 41.9 % (ref 36.0–46.0)
Hemoglobin: 13.8 g/dL (ref 12.0–15.0)
MCH: 30.5 pg (ref 26.0–34.0)
MCHC: 32.9 g/dL (ref 30.0–36.0)
MCV: 92.5 fL (ref 80.0–100.0)
Platelets: 328 10*3/uL (ref 150–400)
RBC: 4.53 MIL/uL (ref 3.87–5.11)
RDW: 12.9 % (ref 11.5–15.5)
WBC: 19.4 10*3/uL — ABNORMAL HIGH (ref 4.0–10.5)
nRBC: 0 % (ref 0.0–0.2)

## 2022-01-01 LAB — BASIC METABOLIC PANEL
Anion gap: 10 (ref 5–15)
BUN: 9 mg/dL (ref 6–20)
CO2: 25 mmol/L (ref 22–32)
Calcium: 9 mg/dL (ref 8.9–10.3)
Chloride: 98 mmol/L (ref 98–111)
Creatinine, Ser: 0.7 mg/dL (ref 0.44–1.00)
GFR, Estimated: >60 mL/min (ref >60)
Glucose, Bld: 115 mg/dL — ABNORMAL HIGH (ref 70–99)
Potassium: 4.5 mmol/L (ref 3.5–5.1)
Sodium: 133 mmol/L — ABNORMAL LOW (ref 135–145)

## 2022-01-01 LAB — GLUCOSE, PLEURAL OR PERITONEAL FLUID: Glucose, Fluid: 83 mg/dL

## 2022-01-01 LAB — EXPECTORATED SPUTUM ASSESSMENT W GRAM STAIN, RFLX TO RESP C

## 2022-01-01 LAB — CYTOLOGY - NON PAP

## 2022-01-01 MED ORDER — GUAIFENESIN 100 MG/5ML PO LIQD
5.0000 mL | ORAL | Status: DC | PRN
  Administered 2022-01-01 – 2022-01-03 (×5): 5 mL via ORAL
  Filled 2022-01-01 (×6): qty 10

## 2022-01-01 MED ORDER — SODIUM CHLORIDE (PF) 0.9 % IJ SOLN
10.0000 mg | Freq: Once | INTRAMUSCULAR | Status: AC
  Administered 2022-01-01: 10 mg via INTRAPLEURAL
  Filled 2022-01-01: qty 10

## 2022-01-01 MED ORDER — PIPERACILLIN-TAZOBACTAM 3.375 G IVPB
3.3750 g | Freq: Three times a day (TID) | INTRAVENOUS | Status: DC
  Administered 2022-01-01 – 2022-01-03 (×7): 3.375 g via INTRAVENOUS
  Filled 2022-01-01 (×7): qty 50

## 2022-01-01 MED ORDER — STERILE WATER FOR INJECTION IJ SOLN
5.0000 mg | Freq: Once | RESPIRATORY_TRACT | Status: AC
  Administered 2022-01-01: 5 mg via INTRAPLEURAL
  Filled 2022-01-01: qty 5

## 2022-01-01 NOTE — Progress Notes (Signed)
HD#2 SUBJECTIVE:  Patient Summary: Monique Harrison is a 46 y.o. with pertinent PMH of asthma, chronic sinusitis, allergies, obesity who presented with chest pain and shortness of breath and admitted for CAP   Overnight Events:  Chest tube placed yesterday    Interm History:  Feeling better today. Breathing more comfortably. Continued sputum production. Scant blood specked sputum. Pain better controlled today.   OBJECTIVE:  Vital Signs: Blood pressure 123/76, pulse 75, temperature 97.9 F (36.6 C), temperature source Oral, resp. rate 16, height '5\' 6"'$  (1.676 m), weight 122.5 kg, SpO2 98 %.  Vitals:   01/01/22 0349 01/01/22 0802 01/01/22 0816 01/01/22 1152  BP: 104/79  135/79 123/76  Pulse:  82 80 75  Resp: '18 16 18 16  '$ Temp:   97.9 F (36.6 C) 97.9 F (36.6 C)  TempSrc:   Oral Oral  SpO2: 100% 100% 100% 98%  Weight:      Height:       Supplemental O2:  SpO2: 98 % O2 Flow Rate (L/min): 2 L/min FiO2 (%): 28 %  Filed Weights   12/30/21 0804 12/30/21 2222  Weight: 124.3 kg 122.5 kg     Intake/Output Summary (Last 24 hours) at 01/01/2022 1218 Last data filed at 01/01/2022 0300 Gross per 24 hour  Intake 590 ml  Output 1030 ml  Net -440 ml   Net IO Since Admission: 220.43 mL [01/01/22 1218]  Physical Exam: Constitutional: well-appearing female sitting in bed, in no acute distress HENT: normocephalic atraumatic, mucous membranes moist Eyes: conjunctiva non-erythematous Neck: supple Cardiovascular: regular rate and rhythm, no m/r/g Pulmonary/Chest: rhonchi bilaterally. Chest tube in place with serosanguinous colored fluid in container.  Abdominal: soft, non-tender, non-distended MSK: normal bulk and tone Neurological: alert & oriented x 3, 5/5 strength in bilateral upper and lower extremities, normal gait Skin: warm and dry Psych: mood and affect appropriate  Patient Lines/Drains/Airways Status     Active Line/Drains/Airways     Name Placement date Placement  time Site Days   Peripheral IV 12/30/21 20 G 1" Right Antecubital 12/30/21  1005  Antecubital  1   Chest Tube 1 Left Pleural 12/30/21  1703  Pleural  1   Incision (Closed) 12/30/21 Back Lateral;Left 12/30/21  2230  -- 1            Pertinent Labs:    Latest Ref Rng & Units 01/01/2022    3:57 AM 12/31/2021    2:19 AM 12/30/2021    4:03 PM  CBC  WBC 4.0 - 10.5 K/uL 19.4  17.5  19.1   Hemoglobin 12.0 - 15.0 g/dL 13.8  12.9  13.5   Hematocrit 36.0 - 46.0 % 41.9  38.1  41.3   Platelets 150 - 400 K/uL 328  297  293        Latest Ref Rng & Units 01/01/2022    3:57 AM 12/31/2021    2:19 AM 12/30/2021    4:03 PM  CMP  Glucose 70 - 99 mg/dL 115  105    BUN 6 - 20 mg/dL 9  6    Creatinine 0.44 - 1.00 mg/dL 0.70  0.73  0.80   Sodium 135 - 145 mmol/L 133  138    Potassium 3.5 - 5.1 mmol/L 4.5  4.7    Chloride 98 - 111 mmol/L 98  105    CO2 22 - 32 mmol/L 25  25    Calcium 8.9 - 10.3 mg/dL 9.0  9.1    Total Protein  6.5 - 8.1 g/dL  7.4    Total Bilirubin 0.3 - 1.2 mg/dL  1.0    Alkaline Phos 38 - 126 U/L  222    AST 15 - 41 U/L  23    ALT 0 - 44 U/L  24      No results for input(s): "GLUCAP" in the last 72 hours.   Pertinent Imaging: DG CHEST PORT 1 VIEW  Result Date: 01/01/2022 CLINICAL DATA:  Empyema, history asthma EXAM: PORTABLE CHEST 1 VIEW COMPARISON:  Portable exam 0541 hours compared 12/31/2021 FINDINGS: LEFT thoracostomy tube stable. Enlargement of cardiac silhouette accentuated by lordotic positioning. Persistent atelectasis versus infiltrate LEFT lung with LEFT pleural effusion. Low lung volumes with RIGHT basilar atelectasis. No pneumothorax or acute osseous findings. IMPRESSION: Persistent atelectasis versus infiltrate LEFT lung and LEFT pleural effusion. Electronically Signed   By: Lavonia Dana M.D.   On: 01/01/2022 08:15   DG CHEST PORT 1 VIEW  Result Date: 12/31/2021 CLINICAL DATA:  Left chest and arm pain, difficulty with deep breathing EXAM: PORTABLE CHEST 1 VIEW  COMPARISON:  12/31/2021 FINDINGS: Small bore pigtail chest drain projects over the left midlung laterally. No definite enlarging effusion or significant pneumothorax by portable radiography. Lower lung volumes with increased basilar opacities compatible with atelectasis or consolidation. Difficult to exclude basilar pneumonia. Cardiac silhouette is obscured but does appear enlarged. Trachea midline. No significant osseous finding. IMPRESSION: Similar low lung volumes with increased basilar atelectasis versus pneumonia. No enlarging effusion or pneumothorax. Overall stable left chest tube. Electronically Signed   By: Jerilynn Mages.  Shick M.D.   On: 12/31/2021 14:06    ASSESSMENT/PLAN:  Assessment: Active Problems:   Recurrent left pleural effusion   Multifocal pneumonia   Pleural effusion   Monique Harrison is a 46 y.o. with pertinent PMH of asthma, chronic sinusitis, allergies, obesity who presented with chest pain and shortness of breath and admitted for CAP on hospital day 2  #CAP with exudative effusion Patient has not responded to ctx and azithro as anticipated. Abx coverage changed to Zosyn by PCCM today.  Her fluid and sputum cultures are NGTD. She has been afebrile despite non-response to abx. Additionally given her hx of liver disease and poorly controlled asthma/allergies we are concerned about possible underlying autoimmune process as etiology. She has had autoimmune titers drawn in the past with elevation in IgG. IgG related disease is possible, and [place] Cabal remains on the differential. We have re-ordered autoimmune labs in the setting of possible autoimmune flare. PCCM will continue flushing chest tube and planning on repeating CT chest tomorrow to assess for fluid clearance. If she does not show improvement over the weekend, will plan for bronch with Bx.  - Continue to follow cultures - Continue Zosyn - supplemental O2 as needed - chest tube management per PCCM - CT tomorrow - IS and flutter  valve to facilitate lung hygiene - autoimmune workup   #Splenic Cyst 9.3cm hypodense splenic lesion with peripheral calcification.  DDX includes hydatid infection, lymphangioma, vascular malformation. Parasitic etiology most common for splenic cysts. This may explain hx of eosinophilia as well.  Asymptomatic. Unable to visualize with [place]. - consider dedicated abdominal MRI to better evaluate cyst and hepatic lesions as outpatient.   # Hepatic lesions # Bile duct stricturing Follows with [facility] for chronically elevated transaminases for which she takes ursodiol. 1.9cm enhancing lesin R hepaic lobe and tubular hypodensities left hepatic lobe seen on CT.  - Dedicated abd mri recommended as an outpatient -  Ursodiol   # Asthma No wheeze on exam. - albuterol, Breo ellipta, Incruse ellipta - will need pulmonology f/u outpatient due to poorly controlled asthma   Diet: Normal VTE: Enoxaparin IVF: None,None Code: Full Prior to Admission Living Arrangement: Home, living daughters Anticipated Discharge Location: Home Barriers to Discharge: medical stability Dispo: Admit patient to Inpatient with expected length of stay greater than 2 midnights.   Signed: Delene Ruffini, MD

## 2022-01-01 NOTE — TOC Progression Note (Signed)
Transition of Care [facility]) - Progression Note    Patient Details  Name: Monique Harrison MRN: [id number] Date of Birth: Dec 20, 1975  Transition of Care (TOC) CM/SW [place], RN Phone Number:[phone]  01/01/2022, 1:58 PM  Clinical Narrative:     Transition of Care (TOC) Screening Note   Patient Details  Name: Monique Harrison Date of Birth: [birth year]   Transition of Care [facility]) CM/SW Contact:    Angelita Ingles, RN Phone Number: 01/01/2022, 1:58 PM    Transition of Care Department [facility]) has reviewed patient and no TOC needs have been identified at this time. We will continue to monitor patient advancement through interdisciplinary progression rounds.           Expected Discharge Plan and Services                                                 Social Determinants of Health (SDOH) Interventions    Readmission Risk Interventions     No data to display

## 2022-01-01 NOTE — Progress Notes (Addendum)
Pharmacy Antibiotic Note  Monique Harrison is a 46 y.o. female admitted on 12/30/2021 with parapneumonic effusion vs. empyema. Pharmacy has been consulted for Zosyn dosing to add anerobic coverage for continued leukocytosis. Pt continues on Azithromycin.  Plan: Zosyn 3.375gm IV q8h Will f/u renal function, micro data, and pt's clinical condition  Height: '5\' 6"'$  (167.6 cm) Weight: 122.5 kg (270 lb 1 oz) IBW/kg (Calculated) : 59.3  Temp (24hrs), Avg:98.3 F (36.8 C), Min:97.7 F (36.5 C), Max:99 F (37.2 C)  Recent Labs  Lab 12/30/21 0822 12/30/21 1603 12/31/21 0219 01/01/22 0357  WBC 18.3* 19.1* 17.5* 19.4*  CREATININE 0.85 0.80 0.73 0.70    Estimated Creatinine Clearance: 117.4 mL/min (by C-G formula based on SCr of 0.7 mg/dL).    Allergies  Allergen Reactions   Aspirin Shortness Of Breath    Pt tolerates aspirin for occasional use.    Antimicrobials this admission: CTX 8/30 >>9/1  Azithro 8/30 >> 9/1 Zosyn 9/1>>  Microbiology results: 8/30 pleural fluid: ngtd   Thank you for allowing pharmacy to be a part of this patient's care.  Sherlon Handing, PharmD, BCPS Please see amion for complete clinical pharmacist phone list 01/01/2022 10:43 AM

## 2022-01-01 NOTE — Progress Notes (Signed)
Chest tube unclamped at this time and on -20 suction per order. Drainage currently at the 1090 mL line.   Justice Rocher, RN

## 2022-01-01 NOTE — Procedures (Signed)
Pleural Fibrinolytic Administration Procedure Note  Monique Harrison  [id number]  Mar 08, 1976  Date:01/01/22  Time:11:39 AM   Provider Performing:Joclynn Lumb D. Harris   Procedure: Pleural Fibrinolysis Subsequent day (93267)  Indication(s) Fibrinolysis of complicated pleural effusion  Consent Risks of the procedure as well as the alternatives and risks of each were explained to the patient and/or caregiver.  Consent for the procedure was obtained.   Anesthesia None   Time Out Verified patient identification, verified procedure, site/side was marked, verified correct patient position, special equipment/implants available, medications/allergies/relevant history reviewed, required imaging and test results available.   Sterile Technique Hand hygiene, gloves   Procedure Description Existing pleural catheter was cleaned and accessed in sterile manner.  '10mg'$  of tPA in 30cc of saline and '5mg'$  of dornase in 30cc of sterile water were injected into pleural space using existing pleural catheter.  Catheter will be clamped for 1 hour and then placed back to suction.   Complications/Tolerance None; patient tolerated the procedure well.  EBL None   Specimen(s) None  Monique Harrison D. Kenton Kingfisher, NP-C [facility] Personal contact information can be found on Amion  01/01/2022, 11:39 AM

## 2022-01-01 NOTE — Progress Notes (Signed)
NAME:  Aamirah Salmi, MRN:  [id number], DOB:  [birth year], LOS: 2 ADMISSION DATE:  12/30/2021, CONSULTATION DATE:  12/30/21 REFERRING MD:  Daryll Drown, CHIEF COMPLAINT:  SOB/chest pain   History of Present Illness:  46 year old woman with history of chronic sinusitis, asthma presenting with 2 weeks of worsening DOE and left sided chest pain.  Pain occurs with inspiration.  MMRC 1.  Pain eased up initially then progressed so she came to ER.  Here noted to have RML PNA, L PNA and L effusion.  PCCM consulted for L effusion.  No pneumonia before.  +cough acute on chronic currently with green sputum.  Pertinent  Medical History  Asthma moderate persistent Chronic sinusitis  Significant Hospital Events: Including procedures, antibiotic start and stop dates in addition to other pertinent events   8/30 admit, left chest tube; left pleural fluid culture sent, started on ceftriaxone and azithro 8/30 pleural fluid cell count: 10K nucleated cells, 85% neutrophil. LD 404 8/31  s/p pleural fibrinolysis of complicated pleural effusion with tPA and dornase with fentanyl premedication and persistent pain requiring 2x 100 mcg fentanyl overnight. 8/31 stable bibasilar opacities - atelectasis vs PNA. Small pigtaill chest drain over L midlung laterally. Stable effusion 01/01/22 Pleural fluid cultures and cytology still pending. Pleural fluid  01/01/22 Repeat CXR with no pneumothorax, difficult to assess for re-expansion of lung 01/01/22 1100 mL output overnight. No fevers. Increased productive cough today. WBC 19.4 (17.5) yesterday.   Interim History / Subjective:  Patient with improved work of breathing, decreased L sided chest pain. Still SOB with  exertion. No fevers, chills, chest pain at rest.   Objective   Blood pressure 104/79, pulse 80, temperature 98.6 F (37 C), temperature source Oral, resp. rate 18, height '5\' 6"'$  (1.676 m), weight 122.5 kg, SpO2 100 %.        Intake/Output Summary (Last 24 hours) at  01/01/2022 0740 Last data filed at 01/01/2022 0300 Gross per 24 hour  Intake 590 ml  Output 1030 ml  Net -440 ml   Filed Weights   12/30/21 0804 12/30/21 2222  Weight: 124.3 kg 122.5 kg    Examination: General: Pleasant, well-appearing woman sitting in bed. No acute distress. Head: Normocephalic. Atraumatic. Clear OP CV: RRR. No murmurs, rubs, or gallops. No LE edema Pulmonary: Normal effort on 3L [place]. Mild ronchi and wheezing bilaterally Abdominal: Soft, nontender, nondistended. Normal bowel sounds. Extremities: Palpable radial and DP pulses. Normal ROM. Skin: Warm and dry. No obvious rash or lesions. L sided catheter site with overlaying gauze. No erythema, purulence or tenderness around site. Neuro: A&Ox3. Moves all extremities. Normal sensation. No focal deficit. Psych: Normal mood and affect  Resolved Hospital Problem list   N/A  Assessment & Plan:  CAP with left exudative parapneumonic effusion vs. Empyema > POC ultrasound on 8/31 shows persistent dependent pocket of fluid in mid-axillary line Moderate persistent asthma with allergic features S/P fibrinolysis of complicated pleural effusion with tPA and dornase Patient required increased doses of Fentanyl (2x 133mg) for pain after procedure. No fever or pain on exam today. Good output from chest tube s/p fibrinolysis, with a total of 1100 mL output, on 4L O2. Repeat chest xray today with mild improvement of L sided fluid pocket, but difficult to assess as patient was rotated. No bacteria seen on cultures on fluid yet. Continue antibiotic therapy. If worsening leukocytosis and continued O2 requirement, consider adding anaerobic coverage. -Continue chest tube to suction with saline flush per protocol -Continue monitoring fluid  output -Repeat CT tomorrow -Continue Ceftriaxone and azithromycin, consider further anaerobic coverage if continued leukocytosis. Therapy will likely for 3w.  -MRSA testing -Add guaifenesin q4h  -Continue  duonebs and Breo-Ellipta -Outpatient follow up with pulmonology for uncontrolled asthma  Best Practice (right click and "Reselect all SmartList Selections" daily)  Per primary  Romana Juniper, MD  Internal Medicine PGY-1 01/01/22

## 2022-01-02 ENCOUNTER — Inpatient Hospital Stay (HOSPITAL_COMMUNITY): Payer: BC Managed Care – PPO

## 2022-01-02 DIAGNOSIS — J45909 Unspecified asthma, uncomplicated: Secondary | ICD-10-CM | POA: Diagnosis not present

## 2022-01-02 DIAGNOSIS — R0902 Hypoxemia: Secondary | ICD-10-CM

## 2022-01-02 DIAGNOSIS — J188 Other pneumonia, unspecified organism: Secondary | ICD-10-CM | POA: Diagnosis not present

## 2022-01-02 DIAGNOSIS — D734 Cyst of spleen: Secondary | ICD-10-CM | POA: Diagnosis not present

## 2022-01-02 DIAGNOSIS — J90 Pleural effusion, not elsewhere classified: Secondary | ICD-10-CM | POA: Diagnosis not present

## 2022-01-02 DIAGNOSIS — J189 Pneumonia, unspecified organism: Secondary | ICD-10-CM | POA: Diagnosis not present

## 2022-01-02 DIAGNOSIS — K769 Liver disease, unspecified: Secondary | ICD-10-CM | POA: Diagnosis not present

## 2022-01-02 LAB — CBC
HCT: 39.5 % (ref 36.0–46.0)
Hemoglobin: 13.2 g/dL (ref 12.0–15.0)
MCH: 30.4 pg (ref 26.0–34.0)
MCHC: 33.4 g/dL (ref 30.0–36.0)
MCV: 91 fL (ref 80.0–100.0)
Platelets: 330 10*3/uL (ref 150–400)
RBC: 4.34 MIL/uL (ref 3.87–5.11)
RDW: 12.8 % (ref 11.5–15.5)
WBC: 15.7 10*3/uL — ABNORMAL HIGH (ref 4.0–10.5)
nRBC: 0 % (ref 0.0–0.2)

## 2022-01-02 LAB — IGG 4: IgG, Subclass 4: 77 mg/dL (ref 2–96)

## 2022-01-02 LAB — BASIC METABOLIC PANEL
Anion gap: 8 (ref 5–15)
BUN: 7 mg/dL (ref 6–20)
CO2: 27 mmol/L (ref 22–32)
Calcium: 9.1 mg/dL (ref 8.9–10.3)
Chloride: 100 mmol/L (ref 98–111)
Creatinine, Ser: 0.66 mg/dL (ref 0.44–1.00)
GFR, Estimated: >60 mL/min (ref >60)
Glucose, Bld: 115 mg/dL — ABNORMAL HIGH (ref 70–99)
Potassium: 4.4 mmol/L (ref 3.5–5.1)
Sodium: 135 mmol/L (ref 135–145)

## 2022-01-02 LAB — ANTI-DNA ANTIBODY, DOUBLE-STRANDED: ds DNA Ab: 1 IU/mL (ref 0–9)

## 2022-01-02 LAB — ANA: Anti Nuclear Antibody (ANA): NEGATIVE

## 2022-01-02 NOTE — Progress Notes (Addendum)
    HD#3 SUBJECTIVE:  Patient Summary: Jarrah Seher is a 46 y.o. with pertinent PMH of asthma, chronic sinusitis, allergies, obesity who presented with chest pain and shortness of breath and admitted for CAP    Interm History:  Patient reports she feels well today.  Had some wheezing but overall feels that her breathing is better.  Continues to have some tenderness from chest to but this is tolerable for her.  OBJECTIVE:  Vital Signs: Blood pressure 122/76, pulse 82, temperature 98.4 F (36.9 C), temperature source Oral, resp. rate 18, height '5\' 6"'$  (1.676 m), weight 122.5 kg, SpO2 99 %.  Vitals:   01/01/22 1620 01/01/22 1944 01/01/22 2311 01/02/22 0327  BP: 137/89 111/88 118/77 122/76  Pulse: 96 86 84 82  Resp: '18 18 18 18  '$ Temp: 98.4 F (36.9 C) 98.4 F (36.9 C) 98 F (36.7 C) 98.4 F (36.9 C)  TempSrc: Oral Oral Oral Oral  SpO2: 93% 99% 98% 99%  Weight:      Height:       Supplemental O2:  SpO2: 99 % O2 Flow Rate (L/min): 2 L/min FiO2 (%): 28 %  Filed Weights   12/30/21 0804 12/30/21 2222  Weight: 124.3 kg 122.5 kg     Intake/Output Summary (Last 24 hours) at 01/02/2022 0639 Last data filed at 01/02/2022 0249 Gross per 24 hour  Intake 970 ml  Output 190 ml  Net 780 ml    Net IO Since Admission: 1,000.43 mL [01/02/22 0639]  Physical Exam: Constitutional: well-appearing female sitting in bed, in no acute distress HENT: normocephalic atraumatic, mucous membranes moist Eyes: conjunctiva non-erythematous Neck: supple Cardiovascular: regular rate and rhythm, no m/r/g Pulmonary/Chest: rhonchi bilaterally with occasional expiratory wheezing. Chest tube in place with serosanguinous colored fluid in container.  Abdominal: soft, non-tender, non-distended MSK: normal bulk and tone Neurological: alert & oriented x 3 no focal deficits  Skin: warm and dry  Assessment: Active Problems:   Recurrent left pleural effusion   Community acquired pneumonia of left lower  lobe of lung   Pleural effusion   Angelyse Heslin is a 46 y.o. with pertinent PMH of asthma, chronic sinusitis, allergies, obesity who presented with chest pain and shortness of breath and admitted for CAP on hospital day 3  #CAP with exudative effusion Continue to feels better today. Fluid and sputum cultures are NGTD. Labs pending for underlying autoimmune process as etiology given history of asthma, liver, and spleen changes. 290 mL output from chest tube. PCCM following, possible CT chest today.  - continue zosyn and follow cultures - follow up autoimmune labs  - Appreciated pulmonology recommendations - supplemental O2 as needed - IS and flutter valve to facilitate lung hygiene  # Hepatic lesions # Bile duct stricturing - continue ursodiol   # Asthma Mild wheezing on exam - albuterol, Breo ellipta, Incruse ellipta - will need pulmonology f/u outpatient due to poorly controlled asthma  Diet: Normal VTE: Enoxaparin IVF: None,None Code: Full Prior to Admission Living Arrangement: Home, living daughters Anticipated Discharge Location: Home Barriers to Discharge: medical stability Dispo: Admit patient to Inpatient with expected length of stay greater than 2 midnights.   Signed: Iona Beard, MD IMT[facility], PGY-3 Pager [phone]

## 2022-01-02 NOTE — Progress Notes (Signed)
NAME:  Monique Harrison, MRN:  [id number], DOB:  12-11-1975, LOS: 3 ADMISSION DATE:  12/30/2021, CONSULTATION DATE:  12/30/21 REFERRING MD:  Daryll Drown, CHIEF COMPLAINT:  SOB/chest pain   History of Present Illness:  46 year old woman with history of chronic sinusitis, asthma presenting with 2 weeks of worsening DOE and left sided chest pain.  Pain occurs with inspiration.  MMRC 1.  Pain eased up initially then progressed so she came to ER.  Here noted to have RML PNA, L PNA and L effusion.  PCCM consulted for L effusion.  No pneumonia before.  +cough acute on chronic currently with green sputum.  Pertinent  Medical History  Asthma moderate persistent Chronic sinusitis  Significant Hospital Events: Including procedures, antibiotic start and stop dates in addition to other pertinent events   8/30 admit, left chest tube; left pleural fluid culture sent, started on ceftriaxone and azithro 8/30 pleural fluid cell count: 10K nucleated cells, 85% neutrophil. LD 404 8/31  s/p pleural fibrinolysis of complicated pleural effusion with tPA and dornase with fentanyl premedication and persistent pain requiring 2x 100 mcg fentanyl overnight. 8/31 stable bibasilar opacities - atelectasis vs PNA. Small pigtaill chest drain over L midlung laterally. Stable effusion 01/01/22 Pleural fluid cultures and cytology still pending. Pleural fluid  01/01/22 Repeat CXR with no pneumothorax, difficult to assess for re-expansion of lung 01/01/22 1100 mL output overnight. No fevers. Increased productive cough today. WBC 19.4 (17.5) yesterday. 01/01/22 250cc output after second dose dornase/tpa 01/02/22 CT Chest shows improvement of effusion with small basilar loculated component.   Interim History / Subjective:  Having some chest tightness anteriorly with deep breathing. Says chest tube pain is 5/10.   Objective   Blood pressure 132/83, pulse 90, temperature 98.4 F (36.9 C), temperature source Oral, resp. rate 20, height '5\' 6"'$   (1.676 m), weight 122.5 kg, SpO2 92 %.        Intake/Output Summary (Last 24 hours) at 01/02/2022 1410 Last data filed at 01/02/2022 0557 Gross per 24 hour  Intake 740 ml  Output 250 ml  Net 490 ml   Filed Weights   12/30/21 0804 12/30/21 2222  Weight: 124.3 kg 122.5 kg    Examination: Resting comfortably in bed Tachycardic, regular No respiratory distress, no wheezes, on nasal cannula, breath sounds diminished bilaterally Left chest tube appropriate position with serosanguinous drainage.  No edema  Additional Pleural studies reviewed - glucose 83 Serum studies - ANA negative DS DNA Ab low   CT Chest personally reviewed shows substantial resolution of the LLL pleural effusion with small loculated medial component. There are patchy consolidative changes bilaterally consistent with multifocal pneumonia.   Resolved Hospital Problem list   N/A  Assessment & Plan:  CAP with complex parapneumonic effusion s/p chest tube placement and intrapleural fibrinolysis x2 Moderate persistent asthma with allergic features - maintain chest tube to LWS. No additional fibrinolytic therapy given her CT appearance and low drainage after second dose, would not pursue additional fibrinolytic therapy  - the small residual left medial basilar effusion is not easy to access and any attempts at drainage would likely cause more harm than benefit. Based on studies and labs coming back - this is most likely infectious and not autoimmune.  - Continue Ceftriaxone and azithromycin, while inpatient. I suspect she will need additional 3 weeks of abx therapy at discharge (augmentin or similar) to complete treatment for her pneumonia with effusion.  - if drainage continues to be low will pull chest tube tomorrow -  she will need ambulatory desat study prior to discharge to evaluation for home oxygen.  - Continue duonebs and Breo-Ellipta - Outpatient follow up with pulmonology for uncontrolled asthma - we will set  her up to see [place] in the office with repeat imaging.   PCCM will follow tomorrow.  She is hoping to go home tomorrow which of course I defer to primary team pending if she has additional medical needs.   Lenice Llamas, MD [facility] [place] 01/02/2022 2:19 PM Pager: see AMION  If no response to pager, please call critical care on call (see AMION) until 7pm After 7:00 pm call Elink

## 2022-01-03 ENCOUNTER — Telehealth: Payer: Self-pay | Admitting: Internal Medicine

## 2022-01-03 DIAGNOSIS — K769 Liver disease, unspecified: Secondary | ICD-10-CM | POA: Diagnosis not present

## 2022-01-03 DIAGNOSIS — J4541 Moderate persistent asthma with (acute) exacerbation: Secondary | ICD-10-CM

## 2022-01-03 DIAGNOSIS — J189 Pneumonia, unspecified organism: Secondary | ICD-10-CM | POA: Diagnosis not present

## 2022-01-03 DIAGNOSIS — J188 Other pneumonia, unspecified organism: Secondary | ICD-10-CM | POA: Diagnosis not present

## 2022-01-03 LAB — BASIC METABOLIC PANEL
Anion gap: 10 (ref 5–15)
BUN: 7 mg/dL (ref 6–20)
CO2: 25 mmol/L (ref 22–32)
Calcium: 8.8 mg/dL — ABNORMAL LOW (ref 8.9–10.3)
Chloride: 101 mmol/L (ref 98–111)
Creatinine, Ser: 0.65 mg/dL (ref 0.44–1.00)
GFR, Estimated: >60 mL/min (ref >60)
Glucose, Bld: 97 mg/dL (ref 70–99)
Potassium: 4.6 mmol/L (ref 3.5–5.1)
Sodium: 136 mmol/L (ref 135–145)

## 2022-01-03 LAB — BODY FLUID CULTURE W GRAM STAIN: Culture: NO GROWTH

## 2022-01-03 LAB — CBC
HCT: 38.5 % (ref 36.0–46.0)
Hemoglobin: 12.6 g/dL (ref 12.0–15.0)
MCH: 30.5 pg (ref 26.0–34.0)
MCHC: 32.7 g/dL (ref 30.0–36.0)
MCV: 93.2 fL (ref 80.0–100.0)
Platelets: 321 10*3/uL (ref 150–400)
RBC: 4.13 MIL/uL (ref 3.87–5.11)
RDW: 12.9 % (ref 11.5–15.5)
WBC: 11 10*3/uL — ABNORMAL HIGH (ref 4.0–10.5)
nRBC: 0 % (ref 0.0–0.2)

## 2022-01-03 LAB — C3 COMPLEMENT: C3 Complement: 337 mg/dL — ABNORMAL HIGH (ref 82–167)

## 2022-01-03 LAB — ANTI-SMOOTH MUSCLE ANTIBODY, IGG: F-Actin IgG: 12 Units (ref 0–19)

## 2022-01-03 LAB — CYCLIC CITRUL PEPTIDE ANTIBODY, IGG/IGA: CCP Antibodies IgG/IgA: <1 units (ref 0–19)

## 2022-01-03 LAB — RHEUMATOID FACTOR: Rheumatoid fact SerPl-aCnc: 32.2 IU/mL — ABNORMAL HIGH (ref <14.0)

## 2022-01-03 LAB — C4 COMPLEMENT: Complement C4, Body Fluid: 52 mg/dL — ABNORMAL HIGH (ref 12–38)

## 2022-01-03 LAB — IGG: IgG (Immunoglobin G), Serum: 1375 mg/dL (ref 586–1602)

## 2022-01-03 LAB — MITOCHONDRIAL ANTIBODIES: Mitochondrial M2 Ab, IgG: <20 Units (ref 0.0–20.0)

## 2022-01-03 MED ORDER — PREDNISONE 20 MG PO TABS: ORAL_TABLET | ORAL | 0 refills | Status: DC

## 2022-01-03 MED ORDER — PREDNISONE 20 MG PO TABS
40.0000 mg | ORAL_TABLET | Freq: Every day | ORAL | Status: DC
Start: 2022-01-04 — End: 2022-01-03

## 2022-01-03 MED ORDER — PREDNISONE 20 MG PO TABS
40.0000 mg | ORAL_TABLET | Freq: Every day | ORAL | Status: DC
  Administered 2022-01-03: 40 mg via ORAL
  Filled 2022-01-03: qty 2

## 2022-01-03 MED ORDER — PREDNISONE 20 MG PO TABS: ORAL_TABLET | ORAL | 0 refills | Status: AC

## 2022-01-03 MED ORDER — AMOXICILLIN-POT CLAVULANATE 875-125 MG PO TABS: 1.0000 | ORAL_TABLET | Freq: Two times a day (BID) | ORAL | 0 refills | Status: DC

## 2022-01-03 MED ORDER — AMOXICILLIN-POT CLAVULANATE 875-125 MG PO TABS
1.0000 | ORAL_TABLET | Freq: Two times a day (BID) | ORAL | 0 refills | Status: AC
Start: 2022-01-03 — End: 2022-01-24

## 2022-01-03 NOTE — Progress Notes (Signed)
NAME:  Monique Harrison, MRN:  [id number], DOB:  [birth year], LOS: 4 ADMISSION DATE:  12/30/2021, CONSULTATION DATE:  12/30/21 REFERRING MD:  Daryll Drown, CHIEF COMPLAINT:  SOB/chest pain   History of Present Illness:  46 year old woman with history of chronic sinusitis, asthma presenting with 2 weeks of worsening DOE and left sided chest pain.  Pain occurs with inspiration.  MMRC 1.  Pain eased up initially then progressed so she came to ER.  Here noted to have RML PNA, L PNA and L effusion.  PCCM consulted for L effusion.  No pneumonia before.  +cough acute on chronic currently with green sputum.  Pertinent  Medical History  Asthma moderate persistent Chronic sinusitis  Significant Hospital Events: Including procedures, antibiotic start and stop dates in addition to other pertinent events   8/30 admit, left chest tube; left pleural fluid culture sent, started on ceftriaxone and azithro 8/30 pleural fluid cell count: 10K nucleated cells, 85% neutrophil. LD 404 8/31  s/p pleural fibrinolysis of complicated pleural effusion with tPA and dornase with fentanyl premedication and persistent pain requiring 2x 100 mcg fentanyl overnight. 8/31 stable bibasilar opacities - atelectasis vs PNA. Small pigtaill chest drain over L midlung laterally. Stable effusion 01/01/22 Pleural fluid cultures and cytology still pending. Pleural fluid  01/01/22 Repeat CXR with no pneumothorax, difficult to assess for re-expansion of lung 01/01/22 1100 mL output overnight. No fevers. Increased productive cough today. WBC 19.4 (17.5) yesterday. 01/01/22 250cc output after second dose dornase/tpa 01/02/22 CT Chest shows improvement of effusion with small basilar loculated component.  9/3 wheezing this morning. Minimal chest tube output.   Interim History / Subjective:  Wheezing this morning. Multiple family members at bedside. Less than 100cc output from chest tube - serous drainage.   Objective   Blood pressure 109/65, pulse 74,  temperature 98.4 F (36.9 C), temperature source Oral, resp. rate 20, height '5\' 6"'$  (1.676 m), weight 122.5 kg, SpO2 95 %.        Intake/Output Summary (Last 24 hours) at 01/03/2022 1136 Last data filed at 01/03/2022 0540 Gross per 24 hour  Intake 800 ml  Output 90 ml  Net 710 ml   Filed Weights   12/30/21 0804 12/30/21 2222  Weight: 124.3 kg 122.5 kg    Examination: Sitting up comfortably in bread Tachycardic, regular End expiratory wheezes  WBC trending down   CT Chest personally reviewed shows substantial resolution of the LLL pleural effusion with small loculated medial component. There are patchy consolidative changes bilaterally consistent with multifocal pneumonia.   Resolved Hospital Problem list   N/A  Assessment & Plan:  CAP with complex parapneumonic effusion s/p chest tube placement and intrapleural fibrinolysis x2 Moderate persistent asthma with allergic features and acute exacerbation - discontinue chest tube - can transition to augmentin at discharge x 3 additional weeks. - she is wheezing today which is new from previous exams. I recommend initiating prednisone 40 mg daily with plans for taper at discharge 40 mg daily x 3 days, 30 mg daily x 3 days, 20 mg daily x 3 days, 10 mg x 3 days.   - she will need ambulatory desat study prior to discharge to evaluation for home oxygen.  - Continue duonebs and Breo-Ellipta. She has a nebulizer machine at home, just need to make sure she has enough neb treatments.  - we will set her up for outpatient follow up in the office with imaging.    Lenice Llamas, MD Pulmonary and Critical Care Medicine  [place] 01/03/2022 11:36 AM Pager: see AMION  If no response to pager, please call critical care on call (see AMION) until 7pm After 7:00 pm call Elink

## 2022-01-03 NOTE — Progress Notes (Signed)
RNCM received order for Oxygen.  Jasmine at [facility] notified of order and confirmation received.  Oxygen to be delivered to patient's room prior to discharge home.

## 2022-01-03 NOTE — Progress Notes (Signed)
Pt chest tube removed as ordered and per protocol. Tip of the tube intact but had some blood clots on it. No active drainage or bleeding noted. Sterile dsg applied and VSS. Pt noted to cough out thick yellow sputums, pt encouraged to continue to use IS. Pt denies any SOB or dyspnea. Will continue to closely monitor pt. Francis Gaines Korry Dalgleish RN.    01/03/22 1233  Vitals  Temp 98.3 F (36.8 C)  Temp Source Oral  BP 121/75  MAP (mmHg) 88  BP Location Left Wrist  BP Method Automatic  Patient Position (if appropriate) Lying  Pulse Rate 86  Pulse Rate Source Monitor  ECG Heart Rate 95  Resp 18  MEWS COLOR  MEWS Score Color Green  Oxygen Therapy  SpO2 100 %  O2 Device Nasal Cannula  O2 Flow Rate (L/min) 3 L/min  Pain Assessment  Pain Scale 0-10  Pain Score 6  Pain Type Acute pain;Surgical pain  Pain Location Flank  Pain Orientation Left  Patients Stated Pain Goal 0  Pain Intervention(s) Medication (See eMAR);Repositioned;Emotional support;Rest  MEWS Score  MEWS Temp 0  MEWS Systolic 0  MEWS Pulse 0  MEWS RR 0  MEWS LOC 0  MEWS Score 0

## 2022-01-03 NOTE — Progress Notes (Signed)
Pt discharge education and instructions completed with pt and family at bedside, all voices understanding and denies any questions. Pt IV and telemetry removed. Pt to pick up electronically sent prescriptions from preferred pharmacy on file. Pt home oxygen was initially declined by PT after [employer] contacted pt about a co-payment. Pt later changed her mind and decides to go home with the home oxygen. [facility] was contacted twice and staff Jehida with [employer] was going to contact pt on her personal cell phone, pt room phone also was provided to staff to contact pt for the copayment and [employer] will deliver pt's home oxygen to her house. Pt MD aware or plan. Pt handed her requested letter from MD. Pt discharged home and to be transported off unit via wheelchair with family and belongings to the side. Pt family to transport pt home. On coming RN notified and aware of plan. Delia Heady RN

## 2022-01-03 NOTE — Discharge Instructions (Addendum)
Dear Monique Harrison,   Thank you for trusting [place] with your care. We treated yo in the hospital for pneumonia. For your pain, you may use extra strength tylenol. We are sending you home with oxygen to wear when you walk. We would like for you to continue taking an oral antibiotic for 3 weeks after discharge. The lung doctors would like for you to follow up in their clinic for your asthma as well as this pneumonia. Their office will call you Tuesday to schedule an appointment. We are also going to start you on a prednisone taper at discharge. Please take Prednisone 40 mg daily x 3 days, 30 mg daily x 3 days, 20 mg daily x 3 days, 10 mg x 3 days. Please also follow up with your primary care physician in 1-2 weeks.

## 2022-01-03 NOTE — Progress Notes (Signed)
Pt ambulated 500 ft total with RN in hallway. Pt first ambulation was on RA and oxygenation saturation was between 88% to 94%, pt saturation increased to between 94% to 100% on 2L oxygen. Pt oxygen dropped to 84% RA during using the BR, increased to 97% when she returned back to bed on RA. Pt endorsed some SOB with activity but states it is tolerable. Will continue to closely monitor. Delia Heady RN

## 2022-01-03 NOTE — Discharge Summary (Signed)
Name: Monique Harrison MRN: [id number] DOB: 09/19/1975 46 y.o. PCP: Kathrine Haddock, MD  Date of Admission: 12/30/2021  7:56 AM Date of Discharge: 01/03/22 Attending Physician: Dr. Philipp Ovens  Discharge Diagnosis: Active Problems:   Recurrent left pleural effusion   Community acquired pneumonia of right lung   Pleural effusion   Hypoxia    Discharge Medications: Allergies as of 01/03/2022       Reactions   Aspirin Shortness Of Breath   Pt tolerates aspirin for occasional use.        Medication List     TAKE these medications    albuterol 108 (90 Base) MCG/ACT inhaler Commonly known as: VENTOLIN HFA Inhale 2 puffs into the lungs every 4 (four) hours as needed. What changed: reasons to take this   amoxicillin-clavulanate 875-125 MG tablet Commonly known as: AUGMENTIN Take 1 tablet by mouth 2 (two) times daily for 21 days.   benzonatate 100 MG capsule Commonly known as: Tessalon Perles Take 1 capsule (100 mg total) by mouth 3 (three) times daily as needed for cough.   fexofenadine-pseudoephedrine 180-240 MG 24 hr tablet Commonly known as: ALLEGRA-D 24 Take 1 tablet by mouth daily as needed (allergies, sinus pressure).   Magnesium 250 MG Tabs Take 1 tablet by mouth daily.   predniSONE 20 MG tablet Commonly known as: DELTASONE Take 2 tablets (40 mg total) by mouth daily with breakfast for 3 days, THEN 1.5 tablets (30 mg total) daily with breakfast for 3 days, THEN 1 tablet (20 mg total) daily with breakfast for 3 days, THEN 0.5 tablets (10 mg total) daily with breakfast for 3 days. Start taking on: January 03, 2022   PROBIOTIC PO Take 1 capsule by mouth daily. Dr. Levonne Lapping probiotic   sertraline 50 MG tablet Commonly known as: ZOLOFT Take 50 mg by mouth daily.   Trelegy Ellipta 200-62.5-25 MCG/ACT Aepb Generic drug: Fluticasone-Umeclidin-Vilant Inhale 1 puff into the lungs daily.   ursodiol 300 MG capsule Commonly known as: ACTIGALL Take 300 mg by  mouth 2 (two) times daily.   Vitamin D3 125 MCG (5000 UT) Caps Take 5,000 Units by mouth daily.               Durable Medical Equipment  (From admission, onward)           Start     Ordered   01/03/22 1603  For home use only DME oxygen  Once       Question Answer Comment  Length of Need 6 Months   Mode or (Route) Nasal cannula   Liters per Minute 2   Frequency Continuous (stationary and portable oxygen unit needed)   Oxygen delivery system Gas      01/03/22 1602            Disposition and follow-up:   Monique Harrison was discharged from [facility] in Stable condition.  At the hospital follow up visit please address:  1.  Follow-up:  a. CAP with effusion - discharged on oral augmentin 3 weeks, prednisone taper, home O2. Needing follow up CT to ensure effusion resolution. [facility] to see outpatient. Autoimmune workup was started given hx of asthma, liver, and lung involvement, however labwork has been unrevealing thus far.      b. Asthma - discharged on pred taper. Follow up with [facility] for poorly controlled asthma.    c. Hepatic lesions - outpatient MRI recommended   d.  2.  Labs / imaging needed at time  of follow-up: CBC, CMP  3.  Pending labs/ test needing follow-up: autoimmune workup  4.  Medication Changes  Started: augmentin, prednison taper, supplemental O2  Abx -  augmentin End Date: 01/24/22  Follow-up Appointments:  Follow-up Information     [facility]. Call.   Specialty: Pulmonology Contact information: [address] [place] [phone] [facility] Course by problem list:  Multifocal CAP with effusion: Patient presented with pleuritic chest pain and shortness of breath. Initial ED workup for PE negative but did reveal multifocal PNA with left pleural effusion. Chest tube was placed, and effusion proved to be exudative. Fluid and sputum  cultures were negative. She was initially started on CTX and azithro with poor improvement and transitioned to zosyn with better response. Due to poor abx response initially with negative cultures, as well as liver and spleen findings and hx of poorly controlled asthma, autoimmune workup was started, but has been unrevealing thus far. She clinically improved, but did require supplemental O2 due to desat with ambulation. Lung findings also were slower to resolve. She was discharged home in stable condition with 3 weeks of augmentin, steroid taper for wheezing, and supplemental O2. Instructed to follow up with [facility] for asthma and repeat CT to ensure effusion resolution.   Hepatic lesions Noted have have hepatic lesions on imaging. She has been followed b [facility] for these. Her home ursodiol was continued without incidence throughout admission.    Discharge Subjective: Feeling well. Pain controlled. She thinks her breathing has gotten better. Feels ready to discharge home.   Discharge Exam:   BP 121/75 (BP Location: Left Wrist)   Pulse 86   Temp 98.3 F (36.8 C) (Oral)   Resp 18   Ht '5\' 6"'$  (1.676 m)   Wt 122.5 kg   SpO2 100%   BMI 43.59 kg/m  Constitutional: well-appearing female sitting in bed, in no acute distress HENT: normocephalic atraumatic, mucous membranes moist Eyes: conjunctiva non-erythematous Neck: supple Cardiovascular: regular rate and rhythm, no m/r/g Pulmonary/Chest: rhonchi bilaterally with occasional expiratory wheezing. Chest tube in place with serosanguinous colored fluid in container, no additional output overnight.  Abdominal: soft, non-tender, non-distended MSK: normal bulk and tone Neurological: alert & oriented x 3 no focal deficits  Skin: warm and dry  Pertinent Labs, Studies, and Procedures:     Latest Ref Rng & Units 01/03/2022    1:14 AM 01/02/2022    1:42 AM 01/01/2022    3:57 AM  CBC  WBC 4.0 - 10.5 K/uL 11.0  15.7  19.4   Hemoglobin 12.0 - 15.0 g/dL  12.6  13.2  13.8   Hematocrit 36.0 - 46.0 % 38.5  39.5  41.9   Platelets 150 - 400 K/uL 321  330  328        Latest Ref Rng & Units 01/03/2022    1:14 AM 01/02/2022    1:42 AM 01/01/2022    3:57 AM  CMP  Glucose 70 - 99 mg/dL 97  115  115   BUN 6 - 20 mg/dL '7  7  9   '$ Creatinine 0.44 - 1.00 mg/dL 0.65  0.66  0.70   Sodium 135 - 145 mmol/L 136  135  133   Potassium 3.5 - 5.1 mmol/L 4.6  4.4  4.5   Chloride 98 - 111 mmol/L 101  100  98   CO2 22 -  32 mmol/L '25  27  25   '$ Calcium 8.9 - 10.3 mg/dL 8.8  9.1  9.0     DG CHEST PORT 1 VIEW  Result Date: 12/31/2021 CLINICAL DATA:  Left chest and arm pain, difficulty with deep breathing EXAM: PORTABLE CHEST 1 VIEW COMPARISON:  12/31/2021 FINDINGS: Small bore pigtail chest drain projects over the left midlung laterally. No definite enlarging effusion or significant pneumothorax by portable radiography. Lower lung volumes with increased basilar opacities compatible with atelectasis or consolidation. Difficult to exclude basilar pneumonia. Cardiac silhouette is obscured but does appear enlarged. Trachea midline. No significant osseous finding. IMPRESSION: Similar low lung volumes with increased basilar atelectasis versus pneumonia. No enlarging effusion or pneumothorax. Overall stable left chest tube. Electronically Signed   By: Jerilynn Mages.  Shick M.D.   On: 12/31/2021 14:06   DG Chest Port 1 View  Result Date: 12/31/2021 CLINICAL DATA:  [id number]; chest tube in place, history of asthma EXAM: PORTABLE CHEST 1 VIEW COMPARISON:  Chest x-ray from yesterday FINDINGS: Again seen is the left-sided chest drain. There is no pneumothorax or significant residual pleural effusion. Low lung volumes. Moderate bibasilar atelectasis without significant interval change. Upper lung fields remain clear. Cardiopericardial silhouette is stable. The visualized skeletal structures are unremarkable. IMPRESSION: Left-sided chest tube is stable. No pneumothorax or residual pleural effusion. Low  lung volumes with moderate bibasilar atelectasis without significant interval change. Electronically Signed   By: Frazier Richards M.D.   On: 12/31/2021 08:07   US SPLEEN (ABDOMEN LIMITED)  Result Date: 12/30/2021 CLINICAL DATA:  Splenic cyst. EXAM: ULTRASOUND ABDOMEN LIMITED COMPARISON:  Chest CT dated 12/30/2021. FINDINGS: The spleen measures 10.4 x 2.1 x 2.8 cm. The rim calcified splenic lesion seen on the chest CT is not seen on ultrasound. Evaluation is limited due to overlying bowel gas. MRI may provide better characterization if clinically indicated. IMPRESSION: Nonvisualization of the splenic lesion on ultrasound. Electronically Signed   By: Anner Crete M.D.   On: 12/30/2021 18:22   DG Chest Port 1 View  Result Date: 12/30/2021 CLINICAL DATA:  [id number]; status post left chest tube insertion EXAM: PORTABLE CHEST 1 VIEW COMPARISON:  Chest x-ray on the same day earlier at 8:09 a.m. FINDINGS: There has been interval left chest tube insertion with the loop of the pigtail is at the left mid lung region. There has been interval decrease in the left pleural effusion. No pneumothorax. Again seen is the moderately decreased lung volumes. Moderate bibasilar opacities on the basis of atelectasis without significant interval change. Again seen is the partially visualized rim calcified lesion at the left upper quadrant of the abdomen with differential considerations as described before. The visualized skeletal structures are unremarkable. IMPRESSION: 1. There has been interval insertion of left chest tube with interval resolution of left pleural effusion. No pneumothorax. 2. Moderate bibasilar atelectasis without significant interval change. Electronically Signed   By: Frazier Richards M.D.   On: 12/30/2021 17:56   CT Angio Chest PE W and/or Wo Contrast  Result Date: 12/30/2021 CLINICAL DATA:  PE suspected.  Positive D-dimer.  Chest pain. EXAM: CT ANGIOGRAPHY CHEST WITH CONTRAST TECHNIQUE: Multidetector CT  imaging of the chest was performed using the standard protocol during bolus administration of intravenous contrast. Multiplanar CT image reconstructions and MIPs were obtained to evaluate the vascular anatomy. RADIATION DOSE REDUCTION: This exam was performed according to the departmental dose-optimization program which includes automated exposure control, adjustment of the mA and/or kV according to patient size and/or use of iterative  reconstruction technique. CONTRAST:  62m OMNIPAQUE IOHEXOL 350 MG/ML SOLN COMPARISON:  Same day chest radiograph at 0809 hours FINDINGS: Cardiovascular: Satisfactory opacification of the pulmonary arteries to the segmental level. No evidence of pulmonary embolism. Normal heart size. No pericardial effusion. Mediastinum/Nodes: No enlarged mediastinal, hilar, or axillary lymph nodes. Thyroid gland, trachea, and esophagus demonstrate no significant findings. Lungs/Pleura: Patchy peribronchovascular consolidations with surrounding ground-glass opacities in the right lower lobe large. Additional small patchy consolidation and atelectasis noted in the right middle lobe. Left pleural effusion with compressive atelectasis in the left lung base as well as in the left upper lobe. Upper Abdomen: A partially visualized 9.3 x 7.3 cm hypodense lesion with attenuation of 25 Hounsfield units and peripheral calcification is noted in the spleen (series 5, image 110; series 11, image 149). Additionally, a subtle 1.9 cm enhancing lesion is noted in the right hepatic lobe (series 5, image 114) as well as ill-defined tubular hypodensities within the left hepatic lobe (series 5, image 110). Musculoskeletal: No chest wall abnormality. No acute or significant osseous findings. Review of the MIP images confirms the above findings. IMPRESSION: 1. No pulmonary embolism. 2. Patchy airspace consolidations in the right middle and right lower lobe, consistent with multifocal pneumonia. 3. Large left pleural  effusion with adjacent compressive atelectasis. 4. Partially visualized 9.3 cm hypodense lesion in the spleen with peripheral calcification. Differential diagnosis includes hydatid infection, lymphangioma, or vascular malformation. Recommend splenic ultrasound for further evaluation. 5. Subtle 1.9 cm enhancing lesion in the right hepatic lobe as well as tubular hypodensities in the left hepatic lobe which may represent bile duct dilatation. When the patient is clinically stable and able to follow directions and hold their breath (preferably as an outpatient) further evaluation with dedicated abdominal MRI should be considered. Electronically Signed   By: LIleana RoupM.D.   On: 12/30/2021 13:10   DG Chest 2 View  Result Date: 12/30/2021 CLINICAL DATA:  Chest pain. Left-sided chest and posterior left shoulder pain. EXAM: CHEST - 2 VIEW COMPARISON:  Chest two views 10/20/2020, 05/01/2018; CT abdomen and pelvis 02/06/2018; report from MRI abdomen 02/06/2018 FINDINGS: Cardiac silhouette and mediastinal contours are within normal limits. There are moderately decreased lung volumes. Small bilateral pleural effusions. Bilateral basilar linear subsegmental atelectasis. No pneumothorax. There is peripherally calcified oval region within the left upper abdominal quadrant posteriorly, corresponding to the rim calcified dense fluid density lesion seen within the spleen on prior 02/06/2018 CT. Differential considerations from prior cross-sectional imaging again include a chronic postinflammatory or posttraumatic (eg. chronic hematoma) lesion. This measures approximately 10 cm. IMPRESSION: 1. Low lung volumes with small bilateral pleural effusions and subsegmental atelectasis. It is difficult to exclude underlying pneumonia. 2. Chronic peripherally calcified left upper quadrant lesion seen on prior radiographs and cross-sectional images and favored to represent the sequela of remote trauma or infection. Electronically Signed    By: RYvonne KendallM.D.   On: 12/30/2021 08:52     Discharge Instructions: Discharge Instructions     Call MD for:  difficulty breathing, headache or visual disturbances   Complete by: As directed    Call MD for:  extreme fatigue   Complete by: As directed    Call MD for:  hives   Complete by: As directed    Call MD for:  persistant dizziness or light-headedness   Complete by: As directed    Call MD for:  persistant nausea and vomiting   Complete by: As directed    Call MD for:  redness, tenderness, or signs of infection (pain, swelling, redness, odor or green/yellow discharge around incision site)   Complete by: As directed    Call MD for:  severe uncontrolled pain   Complete by: As directed    Call MD for:  temperature >100.4   Complete by: As directed    Diet - low sodium heart healthy   Complete by: As directed    Increase activity slowly   Complete by: As directed    No wound care   Complete by: As directed        Signed: Delene Ruffini, MD 01/03/2022, 4:38 PM

## 2022-01-03 NOTE — Telephone Encounter (Signed)
Needs 2 week follow up with APP and [facility] for pneumonia and asthma.  Will need follow up imaging in 4 weeks from today.

## 2022-01-04 LAB — IGE: IgE (Immunoglobulin E), Serum: 102 IU/mL (ref 6–495)

## 2022-01-05 ENCOUNTER — Ambulatory Visit (INDEPENDENT_AMBULATORY_CARE_PROVIDER_SITE_OTHER): Payer: BC Managed Care – PPO

## 2022-01-05 ENCOUNTER — Ambulatory Visit: Payer: BC Managed Care – PPO | Admitting: Primary Care

## 2022-01-05 ENCOUNTER — Encounter: Payer: Self-pay | Admitting: Primary Care

## 2022-01-05 VITALS — BP 126/82 | HR 73 | Temp 98.2°F | Ht 66.0 in | Wt 266.0 lb

## 2022-01-05 DIAGNOSIS — J45909 Unspecified asthma, uncomplicated: Secondary | ICD-10-CM

## 2022-01-05 DIAGNOSIS — J90 Pleural effusion, not elsewhere classified: Secondary | ICD-10-CM | POA: Diagnosis not present

## 2022-01-05 DIAGNOSIS — R079 Chest pain, unspecified: Secondary | ICD-10-CM | POA: Diagnosis not present

## 2022-01-05 DIAGNOSIS — R0902 Hypoxemia: Secondary | ICD-10-CM

## 2022-01-05 DIAGNOSIS — J189 Pneumonia, unspecified organism: Secondary | ICD-10-CM

## 2022-01-05 DIAGNOSIS — R768 Other specified abnormal immunological findings in serum: Secondary | ICD-10-CM

## 2022-01-05 DIAGNOSIS — R7689 Other specified abnormal immunological findings in serum: Secondary | ICD-10-CM

## 2022-01-05 LAB — ANCA TITERS
Atypical P-ANCA titer: <1:20 {titer}
C-ANCA: <1:20 {titer}
P-ANCA: <1:20 {titer}

## 2022-01-05 NOTE — Progress Notes (Signed)
$'@Patient'b$  ID: Monique Harrison, female    DOB: [birth year], 46 y.o.   MRN: [id number]  Chief Complaint  Patient presents with   Follow-up    Referring provider: Kathrine Haddock*  HPI: 46 year old female, never smoked.  Past medical history significant for community-acquired pneumonia, recurrent left pleural effusion, asthma.  Significant Hospital Events: Including procedures, antibiotic start and stop dates in addition to other pertinent events   8/30 admit, left chest tube; left pleural fluid culture sent, started on ceftriaxone and azithro 8/30 pleural fluid cell count: 10K nucleated cells, 85% neutrophil. LD 404 8/31  s/p pleural fibrinolysis of complicated pleural effusion with tPA and dornase with fentanyl premedication and persistent pain requiring 2x 100 mcg fentanyl overnight. 8/31 stable bibasilar opacities - atelectasis vs PNA. Small pigtaill chest drain over L midlung laterally. Stable effusion 01/01/22 Pleural fluid cultures and cytology still pending. Pleural fluid  01/01/22 Repeat CXR with no pneumothorax, difficult to assess for re-expansion of lung 01/01/22 1100 mL output overnight. No fevers. Increased productive cough today. WBC 19.4 (17.5) yesterday. 01/01/22 250cc output after second dose dornase/tpa 01/02/22 CT Chest shows improvement of effusion with small basilar loculated component.  9/3 wheezing this morning. Minimal chest tube output.    Assessment & Plan:  CAP with complex parapneumonic effusion s/p chest tube placement and intrapleural fibrinolysis x2 Moderate persistent asthma with allergic features and acute exacerbation - discontinue chest tube - can transition to augmentin at discharge x 3 additional weeks. - she is wheezing today which is new from previous exams. I recommend initiating prednisone 40 mg daily with plans for taper at discharge 40 mg daily x 3 days, 30 mg daily x 3 days, 20 mg daily x 3 days, 10 mg x 3 days.   - she will need ambulatory  desat study prior to discharge to evaluation for home oxygen.  - Continue duonebs and Breo-Ellipta. She has a nebulizer machine at home, just need to make sure she has enough neb treatments.  - we will set her up for outpatient follow up in the office with imaging.     01/05/2022 Patient presents today for hospital follow-up.  She was admitted on 12/30/2021 for RML/ left sided CAP with left parapneumonic effusion, s/p chest tube placement. She was discharged on 01/03/2022 oral Augmentin x3 weeks, prednisone taper and oxygen.  She will need outpatient CT chest to ensure resolution of pleural effusion.    Overall she feels better but today she is more tired. Cough has improved, reports that it is less purulent and thick. She has 3/10 pain to left chest with yawning or cough. No shortness of breath. She is wearing 2L oxygen. She is still taking Augmentin and prednisone. She continues to use Trelegy for asthma whom she follows with allergy and asthma for. She tells me her asthma symptoms have been well controlled up until the beginning of August when she starting experiencing increased chest tightness.   ANA is negative, rheumatoid factor is elevated. IgE 102   Allergies  Allergen Reactions   Aspirin Shortness Of Breath    Pt tolerates aspirin for occasional use.    Immunization History  Administered Date(s) Administered   Pneumococcal Polysaccharide-23 10/09/2019    Past Medical History:  Diagnosis Date   Abnormal LFTs 2005   Asthma    Recurrent upper respiratory infection (URI)    Upper GI bleed 2016   [place], [place]; presented with melena; admitted for 1 week; thought to be due to frequent Naproxen use  Vitamin D deficiency     Tobacco History: Social History   Tobacco Use  Smoking Status Never  Smokeless Tobacco Never   Counseling given: Not Answered   Outpatient Medications Prior to Visit  Medication Sig Dispense Refill   albuterol (VENTOLIN HFA) 108 (90 Base) MCG/ACT  inhaler Inhale 2 puffs into the lungs every 4 (four) hours as needed. (Patient taking differently: Inhale 2 puffs into the lungs every 4 (four) hours as needed for wheezing or shortness of breath.) 18 g 0   amoxicillin-clavulanate (AUGMENTIN) 875-125 MG tablet Take 1 tablet by mouth 2 (two) times daily for 21 days. 42 tablet 0   benzonatate (TESSALON PERLES) 100 MG capsule Take 1 capsule (100 mg total) by mouth 3 (three) times daily as needed for cough. 20 capsule 0   Cholecalciferol (VITAMIN D3) 125 MCG (5000 UT) CAPS Take 5,000 Units by mouth daily.     fexofenadine-pseudoephedrine (ALLEGRA-D 24) 180-240 MG 24 hr tablet Take 1 tablet by mouth daily as needed (allergies, sinus pressure).     Magnesium 250 MG TABS Take 1 tablet by mouth daily.     predniSONE (DELTASONE) 20 MG tablet Take 2 tablets (40 mg total) by mouth daily with breakfast for 3 days, THEN 1.5 tablets (30 mg total) daily with breakfast for 3 days, THEN 1 tablet (20 mg total) daily with breakfast for 3 days, THEN 0.5 tablets (10 mg total) daily with breakfast for 3 days. 15 tablet 0   Probiotic Product (PROBIOTIC PO) Take 1 capsule by mouth daily. Dr. Levonne Lapping probiotic     sertraline (ZOLOFT) 50 MG tablet Take 50 mg by mouth daily.     TRELEGY ELLIPTA 200-62.5-25 MCG/ACT AEPB Inhale 1 puff into the lungs daily. 28 each 5   ursodiol (ACTIGALL) 300 MG capsule Take 300 mg by mouth 2 (two) times daily.     No facility-administered medications prior to visit.   Review of Systems  Review of Systems  Constitutional:  Positive for fatigue.  HENT: Negative.    Respiratory:  Positive for cough. Negative for chest tightness, shortness of breath and wheezing.     Physical Exam  BP 126/82 (BP Location: Right Wrist, Patient Position: Sitting, Cuff Size: Normal)   Pulse 73   Temp 98.2 F (36.8 C) (Oral)   Ht '5\' 6"'$  (1.676 m)   Wt 266 lb (120.7 kg)   SpO2 97%   BMI 42.93 kg/m  Physical Exam Constitutional:      Appearance: Normal  appearance.  HENT:     Head: Normocephalic and atraumatic.  Cardiovascular:     Rate and Rhythm: Normal rate and regular rhythm.  Pulmonary:     Effort: Pulmonary effort is normal.     Breath sounds: Normal breath sounds. No rhonchi or rales.     Comments: Isolated wheeze right middle lobe Musculoskeletal:        General: Normal range of motion.  Skin:    General: Skin is warm and dry.  Neurological:     General: No focal deficit present.     Mental Status: She is alert and oriented to person, place, and time. Mental status is at baseline.  Psychiatric:        Mood and Affect: Mood normal.        Behavior: Behavior normal.        Thought Content: Thought content normal.        Judgment: Judgment normal.      Lab Results:  CBC  Component Value Date/Time   WBC 14.8 (H) 01/05/2022 1625   RBC 4.18 01/05/2022 1625   HGB 12.7 01/05/2022 1625   HGB 13.9 09/27/2019 1609   HCT 38.5 01/05/2022 1625   HCT 42.9 09/27/2019 1609   PLT 371.0 01/05/2022 1625   PLT 304 05/13/2017 1012   MCV 92.0 01/05/2022 1625   MCV 90 09/27/2019 1609   MCH 30.5 01/03/2022 0114   MCHC 33.1 01/05/2022 1625   RDW 13.0 01/05/2022 1625   RDW 12.7 09/27/2019 1609   LYMPHSABS 1.0 01/05/2022 1625   LYMPHSABS 2.3 09/27/2019 1609   MONOABS 0.5 01/05/2022 1625   EOSABS 0.0 01/05/2022 1625   EOSABS 0.5 (H) 09/27/2019 1609   BASOSABS 0.0 01/05/2022 1625   BASOSABS 0.1 09/27/2019 1609    BMET    Component Value Date/Time   NA 136 01/03/2022 0114   K 4.6 01/03/2022 0114   CL 101 01/03/2022 0114   CO2 25 01/03/2022 0114   GLUCOSE 97 01/03/2022 0114   BUN 7 01/03/2022 0114   CREATININE 0.65 01/03/2022 0114   CALCIUM 8.8 (L) 01/03/2022 0114   GFRNONAA >60 01/03/2022 0114   GFRAA >60 02/12/2018 0448    BNP No results found for: "BNP"  ProBNP No results found for: "PROBNP"  Imaging: DG Chest 2 View  Result Date: 01/05/2022 CLINICAL DATA:  Pneumonia, pleural effusion EXAM: CHEST - 2 VIEW  COMPARISON:  Previous studies including the examination of 01/02/2022 FINDINGS: Cardiac size is within normal limits. There is interval removal of left chest tube. There is marked improvement in the aeration in left lung. There are small linear densities in left parahilar region and both lower lung fields suggesting residual atelectasis/pneumonia. There is minimal blunting of left lateral CP angle. There is ring-like calcific density in the left upper quadrant of abdomen. IMPRESSION: There is almost complete clearing of left pleural effusion with minimal residual effusion. There is improvement in the aeration of both lungs suggesting significant interval decrease in infiltrates. There are small linear patchy densities in left parahilar region and both lower lung fields suggesting residual subsegmental atelectasis/pneumonitis. Electronically Signed   By: Elmer Picker M.D.   On: 01/05/2022 16:33   CT CHEST WO CONTRAST  Result Date: 01/02/2022 CLINICAL DATA:  Pleural effusion, known or suspected (Ped 0-17y) 46 yo asthma, empyema suspected. s/p chest tube drainage. EXAM: CT CHEST WITHOUT CONTRAST TECHNIQUE: Multidetector CT imaging of the chest was performed following the standard protocol without IV contrast. RADIATION DOSE REDUCTION: This exam was performed according to the departmental dose-optimization program which includes automated exposure control, adjustment of the mA and/or kV according to patient size and/or use of iterative reconstruction technique. COMPARISON:  Radiograph 01/01/2022, CT 12/30/2021, MRI 02/06/2018 FINDINGS: Cardiovascular: Normal cardiac size.No pericardial disease.Normal size main and branch pulmonary arteries.The thoracic aorta is unremarkable. Mediastinum/Nodes: No lymphadenopathy.The thyroid is unremarkable.Esophagus is unremarkable.The trachea is unremarkable. Lungs/Pleura: Decreased size of the left pleural pleural effusion after and pigtail chest tube placement. Persistent  small loculated component in the left medial lung base measuring up to 3.0 cm short axis, with adjacent pleural thickening (series 4, image 80). Small loculated component along the left oblique fissure measures 2.0 cm (series 4, image 49). Likely component in the left medial apex measures up to 2.7 cm (series 4, image 44).Persistent multifocal airspace disease with mild improvement on the right middle lobe.Increased ground-glass opacities in the right apex.No pneumothorax. Upper Abdomen: Unchanged liver abnormalities including lobular contours, irregular biliary ductal dilation in the left hepatic  lobe, and focal tubular hyperattenuating opacities in the right and left hepatic lobes, as seen on prior MRI. Unchanged large rim calcified cystic lesion in the spleen, as seen on prior MRI, likely a posttraumatic or post infectious cyst. Musculoskeletal: No acute osseous abnormality. No suspicious osseous lesion. IMPRESSION: Persistent multifocal pneumonia with slight improvement in the right middle lobe and increased ground-glass opacities in the right apex. Decreased size of the left pleural effusion after chest tube placement, with persistent loculated components in the left medial lung base, along the oblique fissure, and the left medial apex. Unchanged abnormal appearance of the liver with biliary ductal dilation and hyperenhancing areas, similar in distribution to prior MRI in October 2019. An updated, dedicated liver protocol MRI should be considered once the patient is clinically stable and able to follow directions (preferably as an outpatient). 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 Electronically Signed   By: Maurine Simmering M.D.   On: 01/02/2022 11:53   DG CHEST PORT 1 VIEW  Result Date: 01/01/2022 CLINICAL DATA:  Empyema, history asthma EXAM: PORTABLE CHEST 1 VIEW COMPARISON:  Portable exam 0541 hours compared  12/31/2021 FINDINGS: LEFT thoracostomy tube stable. Enlargement of cardiac silhouette accentuated by lordotic positioning. Persistent atelectasis versus infiltrate LEFT lung with LEFT pleural effusion. Low lung volumes with RIGHT basilar atelectasis. No pneumothorax or acute osseous findings. IMPRESSION: Persistent atelectasis versus infiltrate LEFT lung and LEFT pleural effusion. Electronically Signed   By: Lavonia Dana M.D.   On: 01/01/2022 08:15   DG CHEST PORT 1 VIEW  Result Date: 12/31/2021 CLINICAL DATA:  Left chest and arm pain, difficulty with deep breathing EXAM: PORTABLE CHEST 1 VIEW COMPARISON:  12/31/2021 FINDINGS: Small bore pigtail chest drain projects over the left midlung laterally. No definite enlarging effusion or significant pneumothorax by portable radiography. Lower lung volumes with increased basilar opacities compatible with atelectasis or consolidation. Difficult to exclude basilar pneumonia. Cardiac silhouette is obscured but does appear enlarged. Trachea midline. No significant osseous finding. IMPRESSION: Similar low lung volumes with increased basilar atelectasis versus pneumonia. No enlarging effusion or pneumothorax. Overall stable left chest tube. Electronically Signed   By: Jerilynn Mages.  Shick M.D.   On: 12/31/2021 14:06   DG Chest Port 1 View  Result Date: 12/31/2021 CLINICAL DATA:  [id number]; chest tube in place, history of asthma EXAM: PORTABLE CHEST 1 VIEW COMPARISON:  Chest x-ray from yesterday FINDINGS: Again seen is the left-sided chest drain. There is no pneumothorax or significant residual pleural effusion. Low lung volumes. Moderate bibasilar atelectasis without significant interval change. Upper lung fields remain clear. Cardiopericardial silhouette is stable. The visualized skeletal structures are unremarkable. IMPRESSION: Left-sided chest tube is stable. No pneumothorax or residual pleural effusion. Low lung volumes with moderate bibasilar atelectasis without significant  interval change. Electronically Signed   By: Frazier Richards M.D.   On: 12/31/2021 08:07   US SPLEEN (ABDOMEN LIMITED)  Result Date: 12/30/2021 CLINICAL DATA:  Splenic cyst. EXAM: ULTRASOUND ABDOMEN LIMITED COMPARISON:  Chest CT dated 12/30/2021. FINDINGS: The spleen measures 10.4 x 2.1 x 2.8 cm. The rim calcified splenic lesion seen on the chest CT is not seen on ultrasound. Evaluation is limited due to overlying bowel gas. MRI may provide better characterization if clinically indicated. IMPRESSION: Nonvisualization of the splenic lesion on ultrasound. Electronically Signed   By: Anner Crete M.D.   On: 12/30/2021 18:22   DG Chest Port 1 View  Result Date:  12/30/2021 CLINICAL DATA:  [id number]; status post left chest tube insertion EXAM: PORTABLE CHEST 1 VIEW COMPARISON:  Chest x-ray on the same day earlier at 8:09 a.m. FINDINGS: There has been interval left chest tube insertion with the loop of the pigtail is at the left mid lung region. There has been interval decrease in the left pleural effusion. No pneumothorax. Again seen is the moderately decreased lung volumes. Moderate bibasilar opacities on the basis of atelectasis without significant interval change. Again seen is the partially visualized rim calcified lesion at the left upper quadrant of the abdomen with differential considerations as described before. The visualized skeletal structures are unremarkable. IMPRESSION: 1. There has been interval insertion of left chest tube with interval resolution of left pleural effusion. No pneumothorax. 2. Moderate bibasilar atelectasis without significant interval change. Electronically Signed   By: Frazier Richards M.D.   On: 12/30/2021 17:56   CT Angio Chest PE W and/or Wo Contrast  Result Date: 12/30/2021 CLINICAL DATA:  PE suspected.  Positive D-dimer.  Chest pain. EXAM: CT ANGIOGRAPHY CHEST WITH CONTRAST TECHNIQUE: Multidetector CT imaging of the chest was performed using the standard protocol during  bolus administration of intravenous contrast. Multiplanar CT image reconstructions and MIPs were obtained to evaluate the vascular anatomy. RADIATION DOSE REDUCTION: This exam was performed according to the departmental dose-optimization program which includes automated exposure control, adjustment of the mA and/or kV according to patient size and/or use of iterative reconstruction technique. CONTRAST:  72m OMNIPAQUE IOHEXOL 350 MG/ML SOLN COMPARISON:  Same day chest radiograph at 0809 hours FINDINGS: Cardiovascular: Satisfactory opacification of the pulmonary arteries to the segmental level. No evidence of pulmonary embolism. Normal heart size. No pericardial effusion. Mediastinum/Nodes: No enlarged mediastinal, hilar, or axillary lymph nodes. Thyroid gland, trachea, and esophagus demonstrate no significant findings. Lungs/Pleura: Patchy peribronchovascular consolidations with surrounding ground-glass opacities in the right lower lobe large. Additional small patchy consolidation and atelectasis noted in the right middle lobe. Left pleural effusion with compressive atelectasis in the left lung base as well as in the left upper lobe. Upper Abdomen: A partially visualized 9.3 x 7.3 cm hypodense lesion with attenuation of 25 Hounsfield units and peripheral calcification is noted in the spleen (series 5, image 110; series 11, image 149). Additionally, a subtle 1.9 cm enhancing lesion is noted in the right hepatic lobe (series 5, image 114) as well as ill-defined tubular hypodensities within the left hepatic lobe (series 5, image 110). Musculoskeletal: No chest wall abnormality. No acute or significant osseous findings. Review of the MIP images confirms the above findings. IMPRESSION: 1. No pulmonary embolism. 2. Patchy airspace consolidations in the right middle and right lower lobe, consistent with multifocal pneumonia. 3. Large left pleural effusion with adjacent compressive atelectasis. 4. Partially visualized 9.3  cm hypodense lesion in the spleen with peripheral calcification. Differential diagnosis includes hydatid infection, lymphangioma, or vascular malformation. Recommend splenic ultrasound for further evaluation. 5. Subtle 1.9 cm enhancing lesion in the right hepatic lobe as well as tubular hypodensities in the left hepatic lobe which may represent bile duct dilatation. When the patient is clinically stable and able to follow directions and hold their breath (preferably as an outpatient) further evaluation with dedicated abdominal MRI should be considered. Electronically Signed   By: LIleana RoupM.D.   On: 12/30/2021 13:10   DG Chest 2 View  Result Date: 12/30/2021 CLINICAL DATA:  Chest pain. Left-sided chest and posterior left shoulder pain. EXAM: CHEST - 2 VIEW COMPARISON:  Chest two views  10/20/2020, 05/01/2018; CT abdomen and pelvis 02/06/2018; report from MRI abdomen 02/06/2018 FINDINGS: Cardiac silhouette and mediastinal contours are within normal limits. There are moderately decreased lung volumes. Small bilateral pleural effusions. Bilateral basilar linear subsegmental atelectasis. No pneumothorax. There is peripherally calcified oval region within the left upper abdominal quadrant posteriorly, corresponding to the rim calcified dense fluid density lesion seen within the spleen on prior 02/06/2018 CT. Differential considerations from prior cross-sectional imaging again include a chronic postinflammatory or posttraumatic (eg. chronic hematoma) lesion. This measures approximately 10 cm. IMPRESSION: 1. Low lung volumes with small bilateral pleural effusions and subsegmental atelectasis. It is difficult to exclude underlying pneumonia. 2. Chronic peripherally calcified left upper quadrant lesion seen on prior radiographs and cross-sectional images and favored to represent the sequela of remote trauma or infection. Electronically Signed   By: Yvonne Kendall M.D.   On: 12/30/2021 08:52     Assessment & Plan:    Community acquired pneumonia of right lung - Admitted on 12/30/21 for RML/left sided CAP with left parapneumonic effusion. Discharged on 3 weeks Augmentin, prednisone taper and oxygen. Symptoms improving. Continue abx course until complete, needs follow-up CXR today.   Recurrent left pleural effusion - Secondary to pneumonia, s/p chest tube  Hypoxia - Improved; No increased demands. Continue oxygen prn to maintain O2 >88-90% and at night - Recommend patient get pulse oximeter to monitor oxygen levels at home. We will re-evaluate oxygen need at follow-up and hopefully discontinue.   Asthma - Stable; Symptoms were well controlled until early August. Following with Asthma and Allergy. Continue Trelegy 1 puff daily. FU with Dr. Shearon Stalls in 3-4 weeks.   Rheumatoid factor positive - RF elevated during hospitialization, ANA negative - Repeat rheumatoid factor   40 mins spent on case; > 50% face to face  Martyn Ehrich, NP 01/17/2022

## 2022-01-05 NOTE — Patient Instructions (Addendum)
Recommendations - Continue Augmentin until 01/24/2022 - Continue prednisone taper until complete - Continue Trelegy 1 puff daily (rinse mouth after use) - Continue Tessalon Perles 1 capsule by mouth 3 times daily as needed for cough - You can start by coming off oxygen at rest for 30-minute intervals,  goal is to maintain O2 greater than 88 to 90%. Continue to wear oxygen at night. You can taper oxygen to 1L with exertion as long as you are staying above 90%. Recommend you look into getting pulse oximeter to monitor oxygen levels at home. We will re-evaluate oxygen need at follow-up and hopefully discontinue.  - Call if you develop worsening cough, purulent sputum or fevers  Orders: - Chest x-ray today - Labs today   Follow-up: - 3 to 4 weeks with Dr. Shearon Stalls

## 2022-01-06 LAB — CBC WITH DIFFERENTIAL/PLATELET
Basophils Absolute: 0 10*3/uL (ref 0.0–0.1)
Basophils Relative: 0.2 % (ref 0.0–3.0)
Eosinophils Absolute: 0 10*3/uL (ref 0.0–0.7)
Eosinophils Relative: 0.1 % (ref 0.0–5.0)
HCT: 38.5 % (ref 36.0–46.0)
Hemoglobin: 12.7 g/dL (ref 12.0–15.0)
Lymphocytes Relative: 6.6 % — ABNORMAL LOW (ref 12.0–46.0)
Lymphs Abs: 1 10*3/uL (ref 0.7–4.0)
MCHC: 33.1 g/dL (ref 30.0–36.0)
MCV: 92 fl (ref 78.0–100.0)
Monocytes Absolute: 0.5 10*3/uL (ref 0.1–1.0)
Monocytes Relative: 3.5 % (ref 3.0–12.0)
Neutro Abs: 13.2 10*3/uL — ABNORMAL HIGH (ref 1.4–7.7)
Neutrophils Relative %: 89.6 % — ABNORMAL HIGH (ref 43.0–77.0)
Platelets: 371 10*3/uL (ref 150.0–400.0)
RBC: 4.18 Mil/uL (ref 3.87–5.11)
RDW: 13 % (ref 11.5–15.5)
WBC: 14.8 10*3/uL — ABNORMAL HIGH (ref 4.0–10.5)

## 2022-01-06 LAB — RHEUMATOID FACTOR: Rheumatoid fact SerPl-aCnc: <14 IU/mL (ref <14)

## 2022-01-06 NOTE — Progress Notes (Signed)
Please let patient know that white cell count was slightly elevated and  Neutrophil absolute was elevated.  No change in plan.  Chest x-ray showed improvement in pneumonia.  Continue antibiotic until complete

## 2022-01-07 LAB — MISC LABCORP TEST (SEND OUT): Labcorp test code: 9985

## 2022-01-17 DIAGNOSIS — R768 Other specified abnormal immunological findings in serum: Secondary | ICD-10-CM | POA: Insufficient documentation

## 2022-01-17 NOTE — Assessment & Plan Note (Addendum)
-   Secondary to pneumonia, s/p chest tube

## 2022-01-17 NOTE — Assessment & Plan Note (Addendum)
-   Improved; No increased demands. Continue oxygen prn to maintain O2 >88-90% and at night - Recommend patient get pulse oximeter to monitor oxygen levels at home. We will re-evaluate oxygen need at follow-up and hopefully discontinue.

## 2022-01-17 NOTE — Assessment & Plan Note (Signed)
-   Stable; Symptoms were well controlled until early August. Following with Asthma and Allergy. Continue Trelegy 1 puff daily. FU with Dr. Shearon Stalls in 3-4 weeks.

## 2022-01-17 NOTE — Assessment & Plan Note (Signed)
-   RF elevated during hospitialization, ANA negative - Repeat rheumatoid factor

## 2022-01-17 NOTE — Assessment & Plan Note (Signed)
-   Admitted on 12/30/21 for RML/left sided CAP with left parapneumonic effusion. Discharged on 3 weeks Augmentin, prednisone taper and oxygen. Symptoms improving. Continue abx course until complete, needs follow-up CXR today.

## 2022-01-21 ENCOUNTER — Ambulatory Visit (INDEPENDENT_AMBULATORY_CARE_PROVIDER_SITE_OTHER)
Admission: RE | Admit: 2022-01-21 | Discharge: 2022-01-21 | Disposition: A | Discharge status: Home / Self Care | Payer: BC Managed Care – PPO | Source: Ambulatory Visit | Attending: Nurse Practitioner | Admitting: Nurse Practitioner

## 2022-01-21 ENCOUNTER — Ambulatory Visit: Payer: BC Managed Care – PPO | Admitting: Nurse Practitioner

## 2022-01-21 ENCOUNTER — Encounter: Payer: Self-pay | Admitting: Nurse Practitioner

## 2022-01-21 VITALS — BP 110/80 | HR 92 | Ht 66.0 in | Wt 268.0 lb

## 2022-01-21 DIAGNOSIS — J45909 Unspecified asthma, uncomplicated: Secondary | ICD-10-CM

## 2022-01-21 DIAGNOSIS — R079 Chest pain, unspecified: Secondary | ICD-10-CM | POA: Diagnosis not present

## 2022-01-21 DIAGNOSIS — J90 Pleural effusion, not elsewhere classified: Secondary | ICD-10-CM

## 2022-01-21 DIAGNOSIS — J189 Pneumonia, unspecified organism: Secondary | ICD-10-CM | POA: Diagnosis not present

## 2022-01-21 LAB — POCT EXHALED NITRIC OXIDE: FeNO level (ppb): 6

## 2022-01-21 MED ORDER — PREDNISONE 10 MG PO TABS: ORAL_TABLET | ORAL | 0 refills | Status: DC

## 2022-01-21 NOTE — Progress Notes (Signed)
Reviewed chest x-ray results with patient.  Appears that left pleural effusion has resolved.  There are similar, mild bibasilar opacities which are stable.  Advised that it can take up to 6 weeks after completing antibiotic therapy for resolution on imaging.  Suspect that her pain is primarily coming from pleurisy.  Advised that she complete prednisone as prescribed and finish her Augmentin.

## 2022-01-21 NOTE — Assessment & Plan Note (Signed)
She is clinically improving. Previous CXR with improved aeration. She has a few days left of augmentin. Advised her to complete as directed. Plan for repeat CT chest 6-12 weeks after completion of abx.

## 2022-01-21 NOTE — Patient Instructions (Addendum)
Continue Trelegy 1 puff daily. Brush tongue and rinse mouth afterwards Continue Albuterol inhaler 2 puffs or 3 mL neb every 6 hours as needed for shortness of breath or wheezing. Notify if symptoms persist despite rescue inhaler/neb use. Continue augmentin as previously prescribed   Prednisone taper. 4 tabs for 2 days, then 3 tabs for 2 days, 2 tabs for 2 days, then 1 tab for 2 days, then stop. Take in AM with food   Chest x ray today at [facility] - we may repeat labs depending on x ray results   Follow up in two weeks with Dr. Shearon Stalls (1st) or Katie Rotunda Worden,NP. If symptoms do not improve or worsen, please contact office for sooner follow up or seek emergency care.

## 2022-01-21 NOTE — Assessment & Plan Note (Addendum)
Recently hospitalized with complex parapneumonic effusion related to CAP. Near resolution on CXR from 9/5 at hospital follow up. She is clinically improved aside from persistent left pleuritic pain. Suspect likely related to pleurisy from recent effusion/CAP but we will recheck CXR today to ensure reaccumulation of fluid or worsening/new infiltrates. Prednisone taper today. Advised she use OTC tylenol as well; do not take NSAIDs while on prednsione.   Patient Instructions  Continue Trelegy 1 puff daily. Brush tongue and rinse mouth afterwards Continue Albuterol inhaler 2 puffs or 3 mL neb every 6 hours as needed for shortness of breath or wheezing. Notify if symptoms persist despite rescue inhaler/neb use. Continue augmentin as previously prescribed   Prednisone taper. 4 tabs for 2 days, then 3 tabs for 2 days, 2 tabs for 2 days, then 1 tab for 2 days, then stop. Take in AM with food   Chest x ray today at [facility] - we may repeat labs depending on x ray results   Follow up in two weeks with Dr. Shearon Stalls (1st) or Katie Marcos Ruelas,NP. If symptoms do not improve or worsen, please contact office for sooner follow up or seek emergency care.

## 2022-01-21 NOTE — Assessment & Plan Note (Signed)
Appears compensated on current regimen. Breathing is stable. Her FeNO was nl today so I do not think her chest discomfort is related to her asthma. See above plan. Continue triple therapy with Trelegy and follow up with asthma/allergy as scheduled.

## 2022-01-21 NOTE — Assessment & Plan Note (Deleted)
She is clinically improving. Previous CXR with improved aeration. She has a few days left of augmentin. Advised her to complete as directed. Plan for repeat CT chest 6-12 weeks after completion of abx.

## 2022-01-21 NOTE — Progress Notes (Signed)
$'@Patient'j$  ID: [name], female    DOB: April 18, 1976, 46 y.o.   MRN: [id number]  Chief Complaint  Patient presents with   Follow-up    Pain in side.    Referring provider: Kathrine Haddock*  HPI: 46 year old female, never smoker followed for CAP with complex parapneumonia effusion. She is a patient of Dr. Mauricio Po and last seen in office 01/05/2022. Past medical history significant for asthma followed by asthma/allergy, obesity.  She was recently hospitalized from 12/30/2021 to 01/03/2022 for CAP with complex parapneumonic effusion requiring chest tube placement and intrapleural fibrinolysis x2. She was treated with IV antibiotics and transitioned to 3 weeks of augmentin upon discharge.   TEST/EVENTS:  12/30/2021 CTA chest: No evidence of PE.  There are patchy peribronchovascular consolidations with surrounding groundglass opacities in the right lower lobe.  There is an additional small patchy consolidation and atelectasis in the right middle lobe.  Left pleural effusion with compressive atelectasis in the left lung base as well as left upper lobe. 01/02/2022 CT chest without contrast: Decrease size of the left pleural effusion after pigtail chest tube placement.  Persistent small loculated component in the left medial lung base, measuring up to 3 cm.  Small loculated component along the left oblique fissure measuring 2 cm.  Likely component in the left medial apex measures up to 2.7 cm.  Persistent multifocal airspace disease with mild improvement on the right middle lobe.  Increased groundglass bases in the right apex. 01/05/2022 CXR 2 view: Almost complete clearing of left pleural effusion with minimal residual effusion.  There is improvement in the aeration of both lungs.  Small linear patchy densities in the left perihilar region and both lower lung fields, suggesting residual subsegmental atelectasis/pneumonia  01/05/2022: OV with Volanda Napoleon NP. Reported feeling better but feeling more tired.  Cough improved; less purulent and thick. Still having 3/10 pain to left chest with yawning or cough. No SOB. Wearing 2 lpm O2. On augmentin. She is on Trelegy and follows with asthma/allergy. She reported that her asthma symptoms had been well-controlled on this up until beginning of August when she started having increased chest tightness. Rheumatoid factor was elevated during her hospitalization - recheck was normal. Recheck CXR - improved aeration; almost complete clearing of left pleural effusion; patchy densities in left parahilar region and both lower lung fields suggesting residual subsegmental atelectasis/pneumonitis.   01/21/2022: Today - follow up Patient presents today for follow up. She reports that her breathing is stable. No significant shortness of breath. She has an occasional cough with clear sputum, which is much improved. She is still having left sided chest discomfort with coughing and deep breathing. She feels like it is an aching pain. She hasn't noticed that it's any worse but doesn't seem to be getting any better than when she was here last. She denies any fevers, chills, hemoptysis, palpitations, dizziness, lower extremity swelling, calf pain. She continues on Trelegy daily. Hasn't required her rescue inhaler. She has a few days left of augmentin.   Allergies  Allergen Reactions   Aspirin Shortness Of Breath    Pt tolerates aspirin for occasional use.    Immunization History  Administered Date(s) Administered   PFIZER Comirnaty(Gray Top)Covid-19 Tri-Sucrose Vaccine 07/19/2019   Pneumococcal Polysaccharide-23 10/09/2019    Past Medical History:  Diagnosis Date   Abnormal LFTs 2005   Asthma    Recurrent upper respiratory infection (URI)    Upper GI bleed 2016   [place], [place]; presented with melena; admitted  for 1 week; thought to be due to frequent Naproxen use   Vitamin D deficiency     Tobacco History: Social History   Tobacco Use  Smoking Status Never   Smokeless Tobacco Never   Counseling given: Not Answered   Outpatient Medications Prior to Visit  Medication Sig Dispense Refill   albuterol (VENTOLIN HFA) 108 (90 Base) MCG/ACT inhaler Inhale 2 puffs into the lungs every 4 (four) hours as needed. (Patient taking differently: Inhale 2 puffs into the lungs every 4 (four) hours as needed for wheezing or shortness of breath.) 18 g 0   amoxicillin-clavulanate (AUGMENTIN) 875-125 MG tablet Take 1 tablet by mouth 2 (two) times daily for 21 days. 42 tablet 0   TRELEGY ELLIPTA 200-62.5-25 MCG/ACT AEPB Inhale 1 puff into the lungs daily. 28 each 5   benzonatate (TESSALON PERLES) 100 MG capsule Take 1 capsule (100 mg total) by mouth 3 (three) times daily as needed for cough. (Patient not taking: Reported on 01/21/2022) 20 capsule 0   Cholecalciferol (VITAMIN D3) 125 MCG (5000 UT) CAPS Take 5,000 Units by mouth daily.     fexofenadine-pseudoephedrine (ALLEGRA-D 24) 180-240 MG 24 hr tablet Take 1 tablet by mouth daily as needed (allergies, sinus pressure). (Patient not taking: Reported on 01/21/2022)     Magnesium 250 MG TABS Take 1 tablet by mouth daily.     Probiotic Product (PROBIOTIC PO) Take 1 capsule by mouth daily. Dr. Levonne Lapping probiotic     sertraline (ZOLOFT) 50 MG tablet Take 50 mg by mouth daily.     ursodiol (ACTIGALL) 300 MG capsule Take 300 mg by mouth 2 (two) times daily.     No facility-administered medications prior to visit.     Review of Systems:   Constitutional: No weight loss or gain, night sweats, fevers, chills, or lassitude. +fatigue (improving) HEENT: No headaches, difficulty swallowing, tooth/dental problems, or sore throat. No sneezing, itching, ear ache, nasal congestion, or post nasal drip CV:  No chest pain, orthopnea, PND, swelling in lower extremities, anasarca, dizziness, palpitations, syncope Resp: +left pleuritic pain (unchanged); improved cough, minimally productive. No shortness of breath with exertion or at rest.  No excess mucus or change in color of mucus. No hemoptysis. No wheezing.  No chest wall deformity GI:  No heartburn, indigestion, abdominal pain, nausea, vomiting, diarrhea, change in bowel habits, loss of appetite, bloody stools.  MSK:  No joint pain or swelling.  No decreased range of motion.  No back pain. Neuro: No dizziness or lightheadedness.  Psych: No depression or anxiety. Mood stable.     Physical Exam:  BP 110/80 (BP Location: Right Arm)   Pulse 92   Ht '5\' 6"'$  (1.676 m)   Wt 268 lb (121.6 kg)   SpO2 95%   BMI 43.26 kg/m   GEN: Pleasant, interactive, well-appearing; morbidly obese; in no acute distress. HEENT:  Normocephalic and atraumatic. PERRLA. Sclera white. Nasal turbinates pink, moist and patent bilaterally. No rhinorrhea present. Oropharynx pink and moist, without exudate or edema. No lesions, ulcerations, or postnasal drip.  NECK:  Supple w/ fair ROM. No JVD present. Normal carotid impulses w/o bruits. Thyroid symmetrical with no goiter or nodules palpated. No lymphadenopathy.   CV: RRR, no m/r/g, no peripheral edema. Pulses intact, +2 bilaterally. No cyanosis, pallor or clubbing. PULMONARY:  Unlabored, regular breathing. Mild rhonchi bilateral bases otherwise clear. No accessory muscle use. No dullness to percussion. No tenderness upon palpation left chest wall GI: BS present and normoactive. Soft, non-tender to palpation.  No organomegaly or masses detected.  MSK: No erythema, warmth or tenderness. Cap refil <2 sec all extrem. No deformities or joint swelling noted.  Neuro: A/Ox3. No focal deficits noted.   Skin: Warm, no lesions or rashe Psych: Normal affect and behavior. Judgement and thought content appropriate.     Lab Results:  CBC    Component Value Date/Time   WBC 14.8 (H) 01/05/2022 1625   RBC 4.18 01/05/2022 1625   HGB 12.7 01/05/2022 1625   HGB 13.9 09/27/2019 1609   HCT 38.5 01/05/2022 1625   HCT 42.9 09/27/2019 1609   PLT 371.0 01/05/2022 1625    PLT 304 05/13/2017 1012   MCV 92.0 01/05/2022 1625   MCV 90 09/27/2019 1609   MCH 30.5 01/03/2022 0114   MCHC 33.1 01/05/2022 1625   RDW 13.0 01/05/2022 1625   RDW 12.7 09/27/2019 1609   LYMPHSABS 1.0 01/05/2022 1625   LYMPHSABS 2.3 09/27/2019 1609   MONOABS 0.5 01/05/2022 1625   EOSABS 0.0 01/05/2022 1625   EOSABS 0.5 (H) 09/27/2019 1609   BASOSABS 0.0 01/05/2022 1625   BASOSABS 0.1 09/27/2019 1609    BMET    Component Value Date/Time   NA 136 01/03/2022 0114   K 4.6 01/03/2022 0114   CL 101 01/03/2022 0114   CO2 25 01/03/2022 0114   GLUCOSE 97 01/03/2022 0114   BUN 7 01/03/2022 0114   CREATININE 0.65 01/03/2022 0114   CALCIUM 8.8 (L) 01/03/2022 0114   GFRNONAA >60 01/03/2022 0114   GFRAA >60 02/12/2018 0448    BNP No results found for: "BNP"   Imaging:  DG Chest 2 View  Result Date: 01/21/2022 CLINICAL DATA:  pleural effusion left; pleuritic pain EXAM: CHEST - 2 VIEW COMPARISON:  Chest x-ray 01/05/2022 FINDINGS: Similar mild bibasilar opacities, which could represent atelectasis and/or pneumonia. No visible pleural effusions pneumothorax. Cardiomediastinal silhouette is within normal limits. Unchanged circular calcification in the left upper quadrant IMPRESSION: Similar mild bibasilar opacities, which could represent atelectasis and/or pneumonia. No visible pleural effusions. Electronically Signed   By: Margaretha Sheffield M.D.   On: 01/21/2022 16:30   DG Chest 2 View  Result Date: 01/05/2022 CLINICAL DATA:  Pneumonia, pleural effusion EXAM: CHEST - 2 VIEW COMPARISON:  Previous studies including the examination of 01/02/2022 FINDINGS: Cardiac size is within normal limits. There is interval removal of left chest tube. There is marked improvement in the aeration in left lung. There are small linear densities in left parahilar region and both lower lung fields suggesting residual atelectasis/pneumonia. There is minimal blunting of left lateral CP angle. There is ring-like  calcific density in the left upper quadrant of abdomen. IMPRESSION: There is almost complete clearing of left pleural effusion with minimal residual effusion. There is improvement in the aeration of both lungs suggesting significant interval decrease in infiltrates. There are small linear patchy densities in left parahilar region and both lower lung fields suggesting residual subsegmental atelectasis/pneumonitis. Electronically Signed   By: Elmer Picker M.D.   On: 01/05/2022 16:33   CT CHEST WO CONTRAST  Result Date: 01/02/2022 CLINICAL DATA:  Pleural effusion, known or suspected (Ped 0-17y) 46 yo asthma, empyema suspected. s/p chest tube drainage. EXAM: CT CHEST WITHOUT CONTRAST TECHNIQUE: Multidetector CT imaging of the chest was performed following the standard protocol without IV contrast. RADIATION DOSE REDUCTION: This exam was performed according to the departmental dose-optimization program which includes automated exposure control, adjustment of the mA and/or kV according to patient size and/or use of iterative reconstruction  technique. COMPARISON:  Radiograph 01/01/2022, CT 12/30/2021, MRI 02/06/2018 FINDINGS: Cardiovascular: Normal cardiac size.No pericardial disease.Normal size main and branch pulmonary arteries.The thoracic aorta is unremarkable. Mediastinum/Nodes: No lymphadenopathy.The thyroid is unremarkable.Esophagus is unremarkable.The trachea is unremarkable. Lungs/Pleura: Decreased size of the left pleural pleural effusion after and pigtail chest tube placement. Persistent small loculated component in the left medial lung base measuring up to 3.0 cm short axis, with adjacent pleural thickening (series 4, image 80). Small loculated component along the left oblique fissure measures 2.0 cm (series 4, image 49). Likely component in the left medial apex measures up to 2.7 cm (series 4, image 44).Persistent multifocal airspace disease with mild improvement on the right middle lobe.Increased  ground-glass opacities in the right apex.No pneumothorax. Upper Abdomen: Unchanged liver abnormalities including lobular contours, irregular biliary ductal dilation in the left hepatic lobe, and focal tubular hyperattenuating opacities in the right and left hepatic lobes, as seen on prior MRI. Unchanged large rim calcified cystic lesion in the spleen, as seen on prior MRI, likely a posttraumatic or post infectious cyst. Musculoskeletal: No acute osseous abnormality. No suspicious osseous lesion. IMPRESSION: Persistent multifocal pneumonia with slight improvement in the right middle lobe and increased ground-glass opacities in the right apex. Decreased size of the left pleural effusion after chest tube placement, with persistent loculated components in the left medial lung base, along the oblique fissure, and the left medial apex. Unchanged abnormal appearance of the liver with biliary ductal dilation and hyperenhancing areas, similar in distribution to prior MRI in October 2019. An updated, dedicated liver protocol MRI should be considered once the patient is clinically stable and able to follow directions (preferably as an outpatient). 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 Electronically Signed   By: Maurine Simmering M.D.   On: 01/02/2022 11:53   DG CHEST PORT 1 VIEW  Result Date: 01/01/2022 CLINICAL DATA:  Empyema, history asthma EXAM: PORTABLE CHEST 1 VIEW COMPARISON:  Portable exam 0541 hours compared 12/31/2021 FINDINGS: LEFT thoracostomy tube stable. Enlargement of cardiac silhouette accentuated by lordotic positioning. Persistent atelectasis versus infiltrate LEFT lung with LEFT pleural effusion. Low lung volumes with RIGHT basilar atelectasis. No pneumothorax or acute osseous findings. IMPRESSION: Persistent atelectasis versus infiltrate LEFT lung and LEFT pleural effusion. Electronically Signed   By: Lavonia Dana  M.D.   On: 01/01/2022 08:15   DG CHEST PORT 1 VIEW  Result Date: 12/31/2021 CLINICAL DATA:  Left chest and arm pain, difficulty with deep breathing EXAM: PORTABLE CHEST 1 VIEW COMPARISON:  12/31/2021 FINDINGS: Small bore pigtail chest drain projects over the left midlung laterally. No definite enlarging effusion or significant pneumothorax by portable radiography. Lower lung volumes with increased basilar opacities compatible with atelectasis or consolidation. Difficult to exclude basilar pneumonia. Cardiac silhouette is obscured but does appear enlarged. Trachea midline. No significant osseous finding. IMPRESSION: Similar low lung volumes with increased basilar atelectasis versus pneumonia. No enlarging effusion or pneumothorax. Overall stable left chest tube. Electronically Signed   By: Jerilynn Mages.  Shick M.D.   On: 12/31/2021 14:06   DG Chest Port 1 View  Result Date: 12/31/2021 CLINICAL DATA:  [id number]; chest tube in place, history of asthma EXAM: PORTABLE CHEST 1 VIEW COMPARISON:  Chest x-ray from yesterday FINDINGS: Again seen is the left-sided chest drain. There is no pneumothorax or significant residual pleural effusion. Low lung volumes. Moderate bibasilar atelectasis without significant interval change. Upper lung fields remain clear. Cardiopericardial silhouette is  stable. The visualized skeletal structures are unremarkable. IMPRESSION: Left-sided chest tube is stable. No pneumothorax or residual pleural effusion. Low lung volumes with moderate bibasilar atelectasis without significant interval change. Electronically Signed   By: Frazier Richards M.D.   On: 12/31/2021 08:07   US SPLEEN (ABDOMEN LIMITED)  Result Date: 12/30/2021 CLINICAL DATA:  Splenic cyst. EXAM: ULTRASOUND ABDOMEN LIMITED COMPARISON:  Chest CT dated 12/30/2021. FINDINGS: The spleen measures 10.4 x 2.1 x 2.8 cm. The rim calcified splenic lesion seen on the chest CT is not seen on ultrasound. Evaluation is limited due to overlying bowel  gas. MRI may provide better characterization if clinically indicated. IMPRESSION: Nonvisualization of the splenic lesion on ultrasound. Electronically Signed   By: Anner Crete M.D.   On: 12/30/2021 18:22   DG Chest Port 1 View  Result Date: 12/30/2021 CLINICAL DATA:  [id number]; status post left chest tube insertion EXAM: PORTABLE CHEST 1 VIEW COMPARISON:  Chest x-ray on the same day earlier at 8:09 a.m. FINDINGS: There has been interval left chest tube insertion with the loop of the pigtail is at the left mid lung region. There has been interval decrease in the left pleural effusion. No pneumothorax. Again seen is the moderately decreased lung volumes. Moderate bibasilar opacities on the basis of atelectasis without significant interval change. Again seen is the partially visualized rim calcified lesion at the left upper quadrant of the abdomen with differential considerations as described before. The visualized skeletal structures are unremarkable. IMPRESSION: 1. There has been interval insertion of left chest tube with interval resolution of left pleural effusion. No pneumothorax. 2. Moderate bibasilar atelectasis without significant interval change. Electronically Signed   By: Frazier Richards M.D.   On: 12/30/2021 17:56   CT Angio Chest PE W and/or Wo Contrast  Result Date: 12/30/2021 CLINICAL DATA:  PE suspected.  Positive D-dimer.  Chest pain. EXAM: CT ANGIOGRAPHY CHEST WITH CONTRAST TECHNIQUE: Multidetector CT imaging of the chest was performed using the standard protocol during bolus administration of intravenous contrast. Multiplanar CT image reconstructions and MIPs were obtained to evaluate the vascular anatomy. RADIATION DOSE REDUCTION: This exam was performed according to the departmental dose-optimization program which includes automated exposure control, adjustment of the mA and/or kV according to patient size and/or use of iterative reconstruction technique. CONTRAST:  66m OMNIPAQUE IOHEXOL  350 MG/ML SOLN COMPARISON:  Same day chest radiograph at 0809 hours FINDINGS: Cardiovascular: Satisfactory opacification of the pulmonary arteries to the segmental level. No evidence of pulmonary embolism. Normal heart size. No pericardial effusion. Mediastinum/Nodes: No enlarged mediastinal, hilar, or axillary lymph nodes. Thyroid gland, trachea, and esophagus demonstrate no significant findings. Lungs/Pleura: Patchy peribronchovascular consolidations with surrounding ground-glass opacities in the right lower lobe large. Additional small patchy consolidation and atelectasis noted in the right middle lobe. Left pleural effusion with compressive atelectasis in the left lung base as well as in the left upper lobe. Upper Abdomen: A partially visualized 9.3 x 7.3 cm hypodense lesion with attenuation of 25 Hounsfield units and peripheral calcification is noted in the spleen (series 5, image 110; series 11, image 149). Additionally, a subtle 1.9 cm enhancing lesion is noted in the right hepatic lobe (series 5, image 114) as well as ill-defined tubular hypodensities within the left hepatic lobe (series 5, image 110). Musculoskeletal: No chest wall abnormality. No acute or significant osseous findings. Review of the MIP images confirms the above findings. IMPRESSION: 1. No pulmonary embolism. 2. Patchy airspace consolidations in the right middle and right lower lobe,  consistent with multifocal pneumonia. 3. Large left pleural effusion with adjacent compressive atelectasis. 4. Partially visualized 9.3 cm hypodense lesion in the spleen with peripheral calcification. Differential diagnosis includes hydatid infection, lymphangioma, or vascular malformation. Recommend splenic ultrasound for further evaluation. 5. Subtle 1.9 cm enhancing lesion in the right hepatic lobe as well as tubular hypodensities in the left hepatic lobe which may represent bile duct dilatation. When the patient is clinically stable and able to follow  directions and hold their breath (preferably as an outpatient) further evaluation with dedicated abdominal MRI should be considered. Electronically Signed   By: Ileana Roup M.D.   On: 12/30/2021 13:10   DG Chest 2 View  Result Date: 12/30/2021 CLINICAL DATA:  Chest pain. Left-sided chest and posterior left shoulder pain. EXAM: CHEST - 2 VIEW COMPARISON:  Chest two views 10/20/2020, 05/01/2018; CT abdomen and pelvis 02/06/2018; report from MRI abdomen 02/06/2018 FINDINGS: Cardiac silhouette and mediastinal contours are within normal limits. There are moderately decreased lung volumes. Small bilateral pleural effusions. Bilateral basilar linear subsegmental atelectasis. No pneumothorax. There is peripherally calcified oval region within the left upper abdominal quadrant posteriorly, corresponding to the rim calcified dense fluid density lesion seen within the spleen on prior 02/06/2018 CT. Differential considerations from prior cross-sectional imaging again include a chronic postinflammatory or posttraumatic (eg. chronic hematoma) lesion. This measures approximately 10 cm. IMPRESSION: 1. Low lung volumes with small bilateral pleural effusions and subsegmental atelectasis. It is difficult to exclude underlying pneumonia. 2. Chronic peripherally calcified left upper quadrant lesion seen on prior radiographs and cross-sectional images and favored to represent the sequela of remote trauma or infection. Electronically Signed   By: Yvonne Kendall M.D.   On: 12/30/2021 08:52          No data to display          No results found for: "NITRICOXIDE"      Assessment & Plan:   Pleurisy with effusion Recently hospitalized with complex parapneumonic effusion related to CAP. Near resolution on CXR from 9/5 at hospital follow up. She is clinically improved aside from persistent left pleuritic pain. Suspect likely related to pleurisy from recent effusion/CAP but we will recheck CXR today to ensure  reaccumulation of fluid or worsening/new infiltrates. Prednisone taper today. Advised she use OTC tylenol as well; do not take NSAIDs while on prednsione.   Patient Instructions  Continue Trelegy 1 puff daily. Brush tongue and rinse mouth afterwards Continue Albuterol inhaler 2 puffs or 3 mL neb every 6 hours as needed for shortness of breath or wheezing. Notify if symptoms persist despite rescue inhaler/neb use. Continue augmentin as previously prescribed   Prednisone taper. 4 tabs for 2 days, then 3 tabs for 2 days, 2 tabs for 2 days, then 1 tab for 2 days, then stop. Take in AM with food   Chest x ray today at [facility] - we may repeat labs depending on x ray results   Follow up in two weeks with Dr. Shearon Stalls (1st) or Katie Danesha Kirchoff,NP. If symptoms do not improve or worsen, please contact office for sooner follow up or seek emergency care.     Asthma Appears compensated on current regimen. Breathing is stable. Her FeNO was nl today so I do not think her chest discomfort is related to her asthma. See above plan. Continue triple therapy with Trelegy and follow up with asthma/allergy as scheduled.   Community acquired pneumonia of right lung She is clinically improving. Previous CXR with improved aeration. She has  a few days left of augmentin. Advised her to complete as directed. Plan for repeat CT chest 6-12 weeks after completion of abx.    I spent 35 minutes of dedicated to the care of this patient on the date of this encounter to include pre-visit review of records, face-to-face time with the patient discussing conditions above, post visit ordering of testing, clinical documentation with the electronic health record, making appropriate referrals as documented, and communicating necessary findings to members of the patients care team.  Clayton Bibles, NP 01/21/2022  Pt aware and understands NP's role.

## 2022-02-02 DIAGNOSIS — J90 Pleural effusion, not elsewhere classified: Secondary | ICD-10-CM | POA: Diagnosis not present

## 2022-02-02 DIAGNOSIS — J189 Pneumonia, unspecified organism: Secondary | ICD-10-CM | POA: Diagnosis not present

## 2022-02-04 DIAGNOSIS — F418 Other specified anxiety disorders: Secondary | ICD-10-CM | POA: Diagnosis not present

## 2022-02-04 DIAGNOSIS — F331 Major depressive disorder, recurrent, moderate: Secondary | ICD-10-CM | POA: Diagnosis not present

## 2022-02-05 ENCOUNTER — Ambulatory Visit: Payer: BC Managed Care – PPO | Admitting: Internal Medicine

## 2022-02-11 ENCOUNTER — Ambulatory Visit: Payer: BC Managed Care – PPO | Admitting: Internal Medicine

## 2022-02-11 ENCOUNTER — Encounter: Payer: Self-pay | Admitting: Internal Medicine

## 2022-02-11 VITALS — BP 100/62 | HR 78 | Temp 98.2°F | Ht 66.0 in | Wt 272.4 lb

## 2022-02-11 DIAGNOSIS — J4540 Moderate persistent asthma, uncomplicated: Secondary | ICD-10-CM

## 2022-02-11 DIAGNOSIS — Z23 Encounter for immunization: Secondary | ICD-10-CM

## 2022-02-11 DIAGNOSIS — J90 Pleural effusion, not elsewhere classified: Secondary | ICD-10-CM | POA: Diagnosis not present

## 2022-02-11 MED ORDER — FLUTICASONE-SALMETEROL 250-50 MCG/ACT IN AEPB: 1.0000 | INHALATION_SPRAY | Freq: Two times a day (BID) | RESPIRATORY_TRACT | 5 refills | Status: DC

## 2022-02-11 NOTE — Patient Instructions (Addendum)
Please schedule follow up scheduled with myself in 4-5 weeks.  If my schedule is not open yet, we will contact you with a reminder closer to that time. Please call [phone] if you haven't heard from [place] a month before.   Before your next visit I would like you to have: CT scan of chest - we will schedule this and call you.   Stop trelegy. I would like you to go back on advair for your asthma - you have needed prednisone for your breathing at least twice in the last few months and I think the inflammation from your lungs will respond well to this.   Ok for flu shot today.   Ok to exercise/activity as tolerated.   Can try taking your albuterol nebulizer more frequently during the day to see if that helps shortness of breath more than albuterol inhaler.

## 2022-02-11 NOTE — Addendum Note (Signed)
Addended by: Vanessa Barbara on: 02/11/2022 09:19 AM   Modules accepted: Orders

## 2022-02-11 NOTE — Progress Notes (Signed)
Monique Harrison    [id number]    23-Jan-1976  Primary Care Physician:Phillips, Gwenyth Allegra, MD Date of Appointment: 02/11/2022 Established Patient Visit  Chief complaint:   Chief Complaint  Patient presents with   Follow-up    Pain is a little bit better in left chest.  Still has pain with deep breaths in and yawning.     HPI: Monique Harrison is a 46 y.o. woman with past medical history of moderate persistent asthma. Was hospitalized in September 2023 for CAP and complex parapneumonic effusion treated with chest tube drainage and intrapleural fibrinolysis.   Interval Updates: Here for follow up after completing prolonged course of augmentin given ongoing pleurisy and some residual effusion at discharge. She has since seen [facility] NP in follow up and was given a course of steroids for pleurisy. Subsequent chest xrays have shown improved aeration of the bilateral lower lobes, especially the left side with the effusion.   Using albuterol inhaler three times/day for shortness of breath and wheezing. She is not using the nebulizer machine at home - was scared to use it because she thought it was spreading her pneumonia.   Has been on trelegy inhaler for 3-4 years. Was on advair previously and did well on this - Dr. Nelva Bush de-esclated due to stable symptoms and was told "she didn't need to be on this long term."  No fevers chills night sweats.   I have reviewed the patient's family social and past medical history and updated as appropriate.   Past Medical History:  Diagnosis Date   Abnormal LFTs 2005   Asthma    Recurrent upper respiratory infection (URI)    Upper GI bleed 2016   [place], [place]; presented with melena; admitted for 1 week; thought to be due to frequent Naproxen use   Vitamin D deficiency     Past Surgical History:  Procedure Laterality Date   BIOPSY  02/07/2018   Procedure: BIOPSY;  Surgeon: Thornton Park, MD;  Location: [facility] ENDOSCOPY;  Service:  Gastroenterology;;   ESOPHAGOGASTRODUODENOSCOPY (EGD) WITH PROPOFOL N/A 02/07/2018   Procedure: ESOPHAGOGASTRODUODENOSCOPY (EGD) WITH PROPOFOL;  Surgeon: Thornton Park, MD;  Location: [place];  Service: Gastroenterology;  Laterality: N/A;   NO PAST SURGERIES      Family History  Problem Relation Age of Onset   Allergic rhinitis Father    Breast cancer Maternal Grandmother     Social History   Occupational History   Not on file  Tobacco Use   Smoking status: Never   Smokeless tobacco: Never  Vaping Use   Vaping Use: Never used  Substance and Sexual Activity   Alcohol use: No   Drug use: No   Sexual activity: Yes     Physical Exam: Blood pressure 100/62, pulse 78, temperature 98.2 F (36.8 C), temperature source Oral, height '5\' 6"'$  (1.676 m), weight 272 lb 6.4 oz (123.6 kg), SpO2 97 %.  Gen:      No acute distress ENT:  no nasal polyps, mucus membranes moist Lungs:    No increased respiratory effort, symmetric chest wall excursion, clear to auscultation bilaterally, no wheezes or crackles CV:         Regular rate and rhythm; no murmurs, rubs, or gallops.  No pedal edema   Data Reviewed: Imaging: I have personally reviewed the chest xray shows improved aeration   PFTs:   Labs:  Immunization status: Immunization History  Administered Date(s) Administered   PFIZER Comirnaty(Gray Top)Covid-19 Tri-Sucrose Vaccine  07/19/2019   Pneumococcal Polysaccharide-23 10/09/2019    External Records Personally Reviewed: pulmonary  Assessment:  CAP with complex parapneumonic effusion - improved Moderate persistent asthma, not well controlled Pleurisy, improved but persistent  Plan/Recommendations:  CT scan of chest - we will schedule this and call you.   Stop trelegy. I would like you to go back on advair for your asthma - you have needed prednisone for your breathing at least twice in the last few months and I think the inflammation from your lungs will respond  well to this.   Ok for flu shot today.   Ok to exercise/activity as tolerated.   Return to Care: Return in about 4 weeks (around 03/11/2022).   Lenice Llamas, MD Pulmonary [facility] [place]

## 2022-02-11 NOTE — Addendum Note (Signed)
Addended by: Vanessa Barbara on: 02/11/2022 09:10 AM   Modules accepted: Orders

## 2022-02-25 ENCOUNTER — Ambulatory Visit (HOSPITAL_COMMUNITY): Payer: BC Managed Care – PPO

## 2022-02-28 ENCOUNTER — Ambulatory Visit (HOSPITAL_COMMUNITY): Payer: BC Managed Care – PPO

## 2022-03-04 ENCOUNTER — Ambulatory Visit: Payer: BC Managed Care – PPO | Admitting: Internal Medicine

## 2022-03-04 DIAGNOSIS — F331 Major depressive disorder, recurrent, moderate: Secondary | ICD-10-CM | POA: Diagnosis not present

## 2022-03-04 DIAGNOSIS — F418 Other specified anxiety disorders: Secondary | ICD-10-CM | POA: Diagnosis not present

## 2022-03-05 ENCOUNTER — Ambulatory Visit (HOSPITAL_COMMUNITY): Payer: BC Managed Care – PPO

## 2022-03-24 ENCOUNTER — Other Ambulatory Visit: Payer: Self-pay | Admitting: Allergy

## 2022-03-29 ENCOUNTER — Telehealth: Payer: Self-pay | Admitting: Allergy & Immunology

## 2022-03-29 NOTE — Telephone Encounter (Signed)
Doran Durand called and asked if we received a request for medical records for this patient. Jamies call back number is [phone]

## 2022-04-08 ENCOUNTER — Encounter: Payer: Self-pay | Admitting: Internal Medicine

## 2022-04-08 ENCOUNTER — Ambulatory Visit (INDEPENDENT_AMBULATORY_CARE_PROVIDER_SITE_OTHER): Payer: BC Managed Care – PPO | Admitting: Internal Medicine

## 2022-04-08 VITALS — BP 122/70 | HR 94 | Ht 66.0 in | Wt 267.0 lb

## 2022-04-08 DIAGNOSIS — R0789 Other chest pain: Secondary | ICD-10-CM

## 2022-04-08 DIAGNOSIS — R091 Pleurisy: Secondary | ICD-10-CM

## 2022-04-08 DIAGNOSIS — J4540 Moderate persistent asthma, uncomplicated: Secondary | ICD-10-CM | POA: Diagnosis not present

## 2022-04-08 DIAGNOSIS — J45909 Unspecified asthma, uncomplicated: Secondary | ICD-10-CM

## 2022-04-08 DIAGNOSIS — J90 Pleural effusion, not elsewhere classified: Secondary | ICD-10-CM

## 2022-04-08 MED ORDER — FLUTICASONE-SALMETEROL 250-50 MCG/ACT IN AEPB: 1.0000 | INHALATION_SPRAY | Freq: Two times a day (BID) | RESPIRATORY_TRACT | 12 refills | Status: DC

## 2022-04-08 MED ORDER — ALBUTEROL SULFATE HFA 108 (90 BASE) MCG/ACT IN AERS: 2.0000 | INHALATION_SPRAY | RESPIRATORY_TRACT | 12 refills | Status: DC | PRN

## 2022-04-08 NOTE — Progress Notes (Signed)
04/08/22- 46 yoF patient of Dr Shearon Stalls, followed for hx CAP with complex parapneumonic effusion, fibrinolysis/ chest tube, Asthma Medical problem list includes Hemorrhagic Gastritis, Cholangitis -Ventolin hfa, Tessalon, Advair 250, Allegra,  Body weight today- Covid vax- Flu - -----Still having chest pain on the left and now towards the middle  Hosp in early Sept with CAP/ effusions, > Augmentin Asthma had been managed with Trelegy de-escalated to Advair. She is out of her inhalers. Not coughing, no phlegm or wheeze currently. She describes chest pains vaguely, not fixed in location and not constant but somewhat movement related. Might be pleuritic. CXR 01/21/22- IMPRESSION: Similar mild bibasilar opacities, which could represent atelectasis and/or pneumonia. No visible pleural effusions.  ROS-see HPI   + = positive Constitutional:    weight loss, night sweats, fevers, chills, fatigue, lassitude. HEENT:    headaches, difficulty swallowing, tooth/dental problems, sore throat,       sneezing, itching, ear ache, nasal congestion, post nasal drip, snoring CV:    +chest pain, orthopnea, PND, swelling in lower extremities, anasarca,                                   dizziness, palpitations Resp:   shortness of breath with exertion or at rest.                productive cough,   non-productive cough, coughing up of blood.              change in color of mucus.  wheezing.   Skin:    rash or lesions. GI:  No-   heartburn, indigestion, abdominal pain, nausea, vomiting, diarrhea,                 change in bowel habits, loss of appetite GU: dysuria, change in color of urine, no urgency or frequency.   flank pain. MS:   joint pain, stiffness, decreased range of motion, back pain. Neuro-     nothing unusual Psych:  change in mood or affect.  depression or anxiety.   memory loss.  OBJ- Physical Exam General- Alert, Oriented, Affect-appropriate, Distress- none evident, +obese Skin- rash-none, lesions-  none, excoriation- none Lymphadenopathy- none Head- atraumatic            Eyes- Gross vision intact, PERRLA, conjunctivae and secretions clear            Ears- Hearing, canals-normal            Nose- Clear, no-Septal dev, mucus, polyps, erosion, perforation             Throat- Mallampati II , mucosa clear , drainage- none, tonsils- atrophic Neck- flexible , trachea midline, no stridor , thyroid nl, carotid no bruit Chest - symmetrical excursion , unlabored           Heart/CV- RRR , no murmur , no gallop  , no rub, nl s1 s2                           - JVD- none , edema- none, stasis changes- none, varices- none           Lung- clear to P&A, wheeze- none, cough- none , dullness-none, rub- none           Chest wall-  Abd-  Br/ Gen/ Rectal- Not done, not indicated Extrem- cyanosis- none, clubbing, none, atrophy- none, strength- nl Neuro- grossly  intact to observation

## 2022-04-08 NOTE — Patient Instructions (Signed)
Order- CT chest no contrast     dx pleuritic pain after pneumonia (CAP)  Order- lab- CBC w diff    dx Pleurisy  Scripts sent refilling Albuterol and Advair inhalers  Try a heating pad. Try Advil

## 2022-04-09 ENCOUNTER — Ambulatory Visit
Admission: EM | Admit: 2022-04-09 | Discharge: 2022-04-09 | Disposition: A | Discharge status: Home / Self Care | Payer: BC Managed Care – PPO | Attending: Physician Assistant | Admitting: Physician Assistant

## 2022-04-09 DIAGNOSIS — Z1152 Encounter for screening for COVID-19: Secondary | ICD-10-CM | POA: Diagnosis not present

## 2022-04-09 DIAGNOSIS — R8290 Unspecified abnormal findings in urine: Secondary | ICD-10-CM | POA: Diagnosis not present

## 2022-04-09 DIAGNOSIS — J069 Acute upper respiratory infection, unspecified: Secondary | ICD-10-CM | POA: Insufficient documentation

## 2022-04-09 LAB — POCT URINALYSIS DIP (MANUAL ENTRY)
Blood, UA: NEGATIVE
Glucose, UA: NEGATIVE mg/dL
Ketones, POC UA: NEGATIVE mg/dL
Nitrite, UA: NEGATIVE
Protein Ur, POC: NEGATIVE mg/dL
Spec Grav, UA: 1.015 (ref 1.010–1.025)
Urobilinogen, UA: 4 E.U./dL — AB
pH, UA: 6 (ref 5.0–8.0)

## 2022-04-09 LAB — RESP PANEL BY RT-PCR (FLU A&B, COVID) ARPGX2
Influenza A by PCR: NEGATIVE
Influenza B by PCR: NEGATIVE
SARS Coronavirus 2 by RT PCR: POSITIVE — AB

## 2022-04-09 LAB — CBC WITH DIFFERENTIAL/PLATELET
Basophils Absolute: 0.1 10*3/uL (ref 0.0–0.1)
Basophils Relative: 0.7 % (ref 0.0–3.0)
Eosinophils Absolute: 0.2 10*3/uL (ref 0.0–0.7)
Eosinophils Relative: 1.4 % (ref 0.0–5.0)
HCT: 40.5 % (ref 36.0–46.0)
Hemoglobin: 13.5 g/dL (ref 12.0–15.0)
Lymphocytes Relative: 8.8 % — ABNORMAL LOW (ref 12.0–46.0)
Lymphs Abs: 1.3 10*3/uL (ref 0.7–4.0)
MCHC: 33.3 g/dL (ref 30.0–36.0)
MCV: 91.7 fl (ref 78.0–100.0)
Monocytes Absolute: 1.3 10*3/uL — ABNORMAL HIGH (ref 0.1–1.0)
Monocytes Relative: 8.8 % (ref 3.0–12.0)
Neutro Abs: 12.1 10*3/uL — ABNORMAL HIGH (ref 1.4–7.7)
Neutrophils Relative %: 80.3 % — ABNORMAL HIGH (ref 43.0–77.0)
Platelets: 282 10*3/uL (ref 150.0–400.0)
RBC: 4.41 Mil/uL (ref 3.87–5.11)
RDW: 13.9 % (ref 11.5–15.5)
WBC: 15 10*3/uL — ABNORMAL HIGH (ref 4.0–10.5)

## 2022-04-09 LAB — POCT URINE PREGNANCY: Preg Test, Ur: NEGATIVE

## 2022-04-09 LAB — POCT RAPID STREP A (OFFICE): Rapid Strep A Screen: NEGATIVE

## 2022-04-09 MED ORDER — AMOXICILLIN-POT CLAVULANATE 875-125 MG PO TABS: 1.0000 | ORAL_TABLET | Freq: Two times a day (BID) | ORAL | 0 refills | Status: DC

## 2022-04-09 NOTE — ED Provider Notes (Signed)
[facility]    CSN: [id number] Arrival date & time: 04/09/22  0820      History   Chief Complaint Chief Complaint  Patient presents with   Abdominal Pain    HPI Monique Harrison is a 46 y.o. female.   Patient here today for evaluation of upper respiratory symptoms and upper abdominal pain.  She reports that she has had congestion, headache, scratchy throat that started 5 days ago.  She does report some fever.  She also notes some abdominal pain that has been intermittent.  She is not currently having pain but states when she was it was located diffusely to upper abdomen.  She denies any nausea, vomiting or diarrhea.  She has tried over-the-counter medication with mild relief.  Patient does have recent history of CAP, pleural effusion as well as history of E.Coli bacteremia. She denies any dysuria, urinary frequency.   The history is provided by the patient.    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   E coli bacteremia 02/10/2018   Cyst of spleen    Right upper quadrant abdominal tenderness without rebound tenderness    Elevated LFTs    Hemorrhagic gastritis 02/08/2018   Hypotension 02/07/2018   Gastritis with hemorrhage 02/07/2018   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      Home Medications    Prior to Admission medications   Medication Sig Start Date End Date Taking? Authorizing Provider  amoxicillin-clavulanate (AUGMENTIN) 875-125 MG tablet Take 1 tablet by mouth every 12 (twelve) hours. 04/09/22  Yes Francene Finders, PA-C  albuterol (VENTOLIN HFA) 108 (90 Base) MCG/ACT inhaler Inhale 2 puffs into the lungs every 4 (four) hours as needed. 04/08/22   Deneise Lever, MD  benzonatate (TESSALON PERLES) 100 MG capsule Take 1 capsule (100 mg total) by mouth 3 (three) times daily as needed for cough. Patient not taking: Reported on 04/08/2022 12/17/21   Valentina Shaggy, MD  Cholecalciferol (VITAMIN D3) 125 MCG (5000 UT) CAPS Take 5,000 Units by mouth daily.    [provider]  fexofenadine-pseudoephedrine (ALLEGRA-D 24) 180-240 MG 24 hr tablet Take 1 tablet by mouth daily as needed (allergies, sinus pressure).    [provider]  fluticasone-salmeterol (ADVAIR DISKUS) 250-50 MCG/ACT AEPB Inhale 1 puff into the lungs in the morning and at bedtime. 04/08/22   Deneise Lever, MD  Fluticasone-Umeclidin-Vilant (TRELEGY ELLIPTA) 200-62.5-25 MCG/ACT AEPB INHALE 1  PUFF BY MOUTH DAILY 03/24/22   Kennith Gain, MD  Magnesium 250 MG TABS Take 1 tablet by mouth daily.    [provider]  sertraline (ZOLOFT) 50 MG tablet Take 50 mg by mouth daily. 12/30/21   [provider]  ursodiol (ACTIGALL) 300 MG capsule Take 300 mg by mouth 2 (two) times daily. 11/25/20   [provider]    Family History Family History  Problem Relation Age of Onset   Allergic rhinitis  Father    Breast cancer Maternal Grandmother     Social History Social History   Tobacco Use   Smoking status: Never   Smokeless tobacco: Never  Vaping Use   Vaping Use: Never used  Substance Use Topics   Alcohol use: No   Drug use: No     Allergies   Aspirin   Review of Systems Review of Systems  Constitutional:  Positive for fever.  HENT:  Positive for congestion and sore throat. Negative for ear pain.   Eyes:  Negative for discharge and redness.  Respiratory:  Positive for cough. Negative for shortness of breath and wheezing.   Gastrointestinal:  Positive for abdominal pain. Negative for diarrhea, nausea and vomiting.  Genitourinary:  Negative for dysuria and frequency.     Physical Exam Triage Vital Signs ED Triage Vitals  Enc Vitals Group     BP      Pulse      Resp      Temp      Temp src      SpO2      Weight      Height      Head Circumference      Peak Flow      Pain Score      Pain Loc      Pain Edu?      Excl. in [place]?    No data found.  Updated Vital Signs BP 111/73 (BP Location: Right Arm)   Pulse 100   Temp 99 F (37.2 C) (Oral)   Resp 16   SpO2 96%      Physical Exam Vitals and nursing note reviewed.  Constitutional:      General: She is not in acute distress.    Appearance: Normal appearance. She is well-developed. She is not ill-appearing.  HENT:     Head: Normocephalic and atraumatic.     Right Ear: Tympanic membrane normal.     Left Ear: Tympanic membrane normal.     Nose: Congestion present.     Mouth/Throat:     Mouth: Mucous membranes are moist.     Pharynx: No oropharyngeal exudate or posterior oropharyngeal erythema.  Eyes:     Conjunctiva/sclera: Conjunctivae normal.  Cardiovascular:     Rate and Rhythm: Normal rate and regular rhythm.     Heart sounds: Normal heart sounds. No murmur heard. Pulmonary:     Effort: Pulmonary effort is normal. No respiratory distress.     Breath sounds: Normal breath sounds. No  wheezing, rhonchi or rales.  Skin:    General: Skin is warm and dry.  Neurological:     Mental Status: She is alert.  Psychiatric:        Mood and Affect: Mood normal.        Thought Content: Thought content normal.      UC Treatments / Results  Labs (all labs ordered are listed, but only abnormal results are displayed) Labs Reviewed  POCT URINALYSIS DIP (MANUAL  ENTRY) - Abnormal; Notable for the following components:      Result Value   Color, UA orange (*)    Clarity, UA cloudy (*)    Bilirubin, UA small (*)    Urobilinogen, UA 4.0 (*)    Leukocytes, UA Trace (*)    All other components within normal limits  URINE CULTURE  RESP PANEL BY RT-PCR (FLU A&B, COVID) ARPGX2  POCT RAPID STREP A (OFFICE)  POCT URINE PREGNANCY    EKG   Radiology No results found.  Procedures Procedures (including critical care time)  Medications Ordered in UC Medications - No data to display  Initial Impression / Assessment and Plan / UC Course  I have reviewed the triage vital signs and the nursing notes.  Pertinent labs & imaging results that were available during my care of the patient were reviewed by me and considered in my medical decision making (see chart for details).    Suspect viral etiology of upper respiratory symptoms and will screen for covid and flu. Trace LE on UA- will order culture and treat with augmentin for some upper respiratory tract coverage as well given history. Urine culture ordered. Encouraged follow up if no gradual improvement or with any further concerns. Patient expresses understanding.   Final Clinical Impressions(s) / UC Diagnoses   Final diagnoses:  Acute upper respiratory infection  Abnormal urine findings  Encounter for screening for COVID-19   Discharge Instructions   None    ED Prescriptions     Medication Sig Dispense Auth. Provider   amoxicillin-clavulanate (AUGMENTIN) 875-125 MG tablet Take 1 tablet by mouth every 12 (twelve) hours. 14  tablet Francene Finders, PA-C      PDMP not reviewed this encounter.   Francene Finders, PA-C 04/09/22 1042

## 2022-04-09 NOTE — ED Triage Notes (Signed)
Pt c/o headache, abd pain, feeling feverish, scratchy throat,   Denies nausea, vomiting, diarrhea, constipation   Onset ~ Sunday

## 2022-04-11 LAB — URINE CULTURE

## 2022-05-04 ENCOUNTER — Ambulatory Visit
Admission: EM | Admit: 2022-05-04 | Discharge: 2022-05-04 | Disposition: A | Discharge status: Home / Self Care | Payer: BC Managed Care – PPO | Attending: Physician Assistant | Admitting: Physician Assistant

## 2022-05-04 DIAGNOSIS — J45901 Unspecified asthma with (acute) exacerbation: Secondary | ICD-10-CM

## 2022-05-04 DIAGNOSIS — J069 Acute upper respiratory infection, unspecified: Secondary | ICD-10-CM | POA: Diagnosis not present

## 2022-05-04 LAB — POCT INFLUENZA A/B
Influenza A, POC: NEGATIVE
Influenza B, POC: NEGATIVE

## 2022-05-04 MED ORDER — PREDNISONE 20 MG PO TABS: 40.0000 mg | ORAL_TABLET | Freq: Every day | ORAL | 0 refills | Status: AC

## 2022-05-04 NOTE — ED Triage Notes (Signed)
Pt presents to uc with co of cough sore throat since yesterday. Pt reports taking otc cold and flu and cough medications. Pt reports she has also been using inhaler.

## 2022-05-04 NOTE — ED Provider Notes (Signed)
[facility]    CSN: [id number] Arrival date & time: 05/04/22  0808      History   Chief Complaint Chief Complaint  Patient presents with   Nasal Congestion   Cough   Sore Throat    HPI Monique Harrison is a 47 y.o. female.   Patient here today for evaluation of cough, sore throat, congestion that started yesterday.  She reports she has had more wheezing.  She has taken over-the-counter cold and flu medicine without resolution.  She has also been using her inhaler.  She denies any fever, vomiting or diarrhea.  She had COVID about a month ago.   The history is provided by the patient.    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   E coli bacteremia 02/10/2018   Cyst of spleen    Right upper quadrant abdominal tenderness without rebound tenderness    Elevated LFTs    Hemorrhagic gastritis 02/08/2018   Hypotension 02/07/2018   Gastritis with hemorrhage 02/07/2018   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      Home Medications    Prior to Admission medications   Medication Sig Start Date End Date Taking? Authorizing Provider  predniSONE (DELTASONE) 20 MG tablet Take 2 tablets (40 mg total) by mouth daily with breakfast for 5 days. 05/04/22 05/09/22 Yes Francene Finders, PA-C  albuterol (VENTOLIN HFA) 108 (90 Base) MCG/ACT inhaler Inhale 2 puffs into the lungs every 4 (four) hours as needed. 04/08/22   Deneise Lever, MD  amoxicillin-clavulanate (AUGMENTIN) 875-125 MG tablet Take 1 tablet by mouth every 12 (twelve) hours. 04/09/22   Francene Finders, PA-C  benzonatate (TESSALON PERLES) 100 MG capsule Take 1 capsule (100 mg total) by mouth 3 (three) times daily as needed for cough. Patient not taking: Reported on 04/08/2022 12/17/21   Valentina Shaggy, MD  Cholecalciferol (VITAMIN D3) 125 MCG (5000 UT) CAPS Take 5,000 Units by mouth daily.    [provider]  fexofenadine-pseudoephedrine (ALLEGRA-D 24) 180-240 MG 24 hr tablet Take 1 tablet by mouth daily as needed (allergies, sinus pressure).    [provider]  fluticasone-salmeterol (ADVAIR DISKUS) 250-50 MCG/ACT AEPB Inhale 1 puff into the lungs in the morning and at bedtime. 04/08/22   Deneise Lever, MD  Fluticasone-Umeclidin-Vilant (TRELEGY ELLIPTA) 200-62.5-25 MCG/ACT AEPB INHALE 1 PUFF BY MOUTH DAILY 03/24/22   Kennith Gain, MD  Magnesium 250 MG  TABS Take 1 tablet by mouth daily.    [provider]  sertraline (ZOLOFT) 50 MG tablet Take 50 mg by mouth daily. 12/30/21   [provider]  ursodiol (ACTIGALL) 300 MG capsule Take 300 mg by mouth 2 (two) times daily. 11/25/20   [provider]    Family History Family History  Problem Relation Age of Onset   Allergic rhinitis Father    Breast cancer Maternal Grandmother     Social History Social History   Tobacco Use   Smoking status: Never   Smokeless  tobacco: Never  Vaping Use   Vaping Use: Never used  Substance Use Topics   Alcohol use: No   Drug use: No     Allergies   Aspirin   Review of Systems Review of Systems  Constitutional:  Negative for chills and fever.  HENT:  Positive for congestion and sore throat. Negative for ear pain.   Eyes:  Negative for discharge and redness.  Respiratory:  Positive for cough and wheezing. Negative for shortness of breath.   Gastrointestinal:  Negative for diarrhea, nausea and vomiting.     Physical Exam Triage Vital Signs ED Triage Vitals  Enc Vitals Group     BP      Pulse      Resp      Temp      Temp src      SpO2      Weight      Height      Head Circumference      Peak Flow      Pain Score      Pain Loc      Pain Edu?      Excl. in [place]?    No data found.  Updated Vital Signs BP (!) 172/75   Pulse (!) 107   Temp 97.7 F (36.5 C) (Oral)   Resp 19   LMP  (LMP Unknown)   SpO2 98%      Physical Exam Vitals and nursing note reviewed.  Constitutional:      General: She is not in acute distress.    Appearance: Normal appearance. She is not ill-appearing.  HENT:     Head: Normocephalic and atraumatic.     Nose: Congestion present.     Mouth/Throat:     Mouth: Mucous membranes are moist.     Pharynx: No oropharyngeal exudate or posterior oropharyngeal erythema.  Eyes:     Conjunctiva/sclera: Conjunctivae normal.  Cardiovascular:     Rate and Rhythm: Normal rate and regular rhythm.     Heart sounds: Normal heart sounds. No murmur heard. Pulmonary:     Effort: Pulmonary effort is normal. No respiratory distress.     Breath sounds: Normal breath sounds. No wheezing, rhonchi or rales.  Skin:    General: Skin is warm and dry.  Neurological:     Mental Status: She is alert.  Psychiatric:        Mood and Affect: Mood normal.        Thought Content: Thought content normal.      UC Treatments / Results  Labs (all labs ordered are listed, but only abnormal  results are displayed) Labs Reviewed  POCT INFLUENZA A/B    EKG   Radiology No results found.  Procedures Procedures (including critical care time)  Medications Ordered in UC Medications - No data to display  Initial Impression / Assessment and Plan / UC Course  I have reviewed the  triage vital signs and the nursing notes.  Pertinent labs & imaging results that were available during my care of the patient were reviewed by me and considered in my medical decision making (see chart for details).    Likely viral etiology of symptoms.  Discussed possibility of developing asthma exacerbation given increased wheezing and will send in prescription for steroid burst for treatment of same.  Recommended continued use of albuterol if needed.  Encouraged follow-up if no gradual improvement or with any worsening.  Patient expressed understanding.  Final Clinical Impressions(s) / UC Diagnoses   Final diagnoses:  Acute upper respiratory infection  Asthma with acute exacerbation, unspecified asthma severity, unspecified whether persistent   Discharge Instructions   None    ED Prescriptions     Medication Sig Dispense Auth. Provider   predniSONE (DELTASONE) 20 MG tablet Take 2 tablets (40 mg total) by mouth daily with breakfast for 5 days. 10 tablet Francene Finders, PA-C      PDMP not reviewed this encounter.   Francene Finders, PA-C 05/04/22 [phone]

## 2022-05-12 ENCOUNTER — Encounter: Payer: Self-pay | Admitting: Internal Medicine

## 2022-05-12 NOTE — Assessment & Plan Note (Signed)
Moderate persistent. Emphasized importance of keeping inhalerss available. Plan- refill albuterol hfa and Advair

## 2022-05-12 NOTE — Assessment & Plan Note (Signed)
Possibly pleurisy, although exam is really unimpressive today. May be musculoskeletal component. I do not currently suspect PE. Plan- CT, CBC w diff, Heating pad/ Advil

## 2022-06-07 DIAGNOSIS — F411 Generalized anxiety disorder: Secondary | ICD-10-CM | POA: Diagnosis not present

## 2022-06-07 DIAGNOSIS — Z0000 Encounter for general adult medical examination without abnormal findings: Secondary | ICD-10-CM | POA: Diagnosis not present

## 2022-06-07 DIAGNOSIS — J4520 Mild intermittent asthma, uncomplicated: Secondary | ICD-10-CM | POA: Diagnosis not present

## 2022-06-07 DIAGNOSIS — K8301 Primary sclerosing cholangitis: Secondary | ICD-10-CM | POA: Diagnosis not present

## 2022-06-11 DIAGNOSIS — R748 Abnormal levels of other serum enzymes: Secondary | ICD-10-CM | POA: Diagnosis not present

## 2022-06-11 DIAGNOSIS — I517 Cardiomegaly: Secondary | ICD-10-CM | POA: Diagnosis not present

## 2022-06-11 DIAGNOSIS — K8301 Primary sclerosing cholangitis: Secondary | ICD-10-CM | POA: Diagnosis not present

## 2022-06-11 DIAGNOSIS — R160 Hepatomegaly, not elsewhere classified: Secondary | ICD-10-CM | POA: Diagnosis not present

## 2022-06-11 DIAGNOSIS — R17 Unspecified jaundice: Secondary | ICD-10-CM | POA: Diagnosis not present

## 2022-06-11 DIAGNOSIS — K8050 Calculus of bile duct without cholangitis or cholecystitis without obstruction: Secondary | ICD-10-CM | POA: Diagnosis not present

## 2022-06-11 DIAGNOSIS — R7401 Elevation of levels of liver transaminase levels: Secondary | ICD-10-CM | POA: Diagnosis not present

## 2022-06-11 DIAGNOSIS — K838 Other specified diseases of biliary tract: Secondary | ICD-10-CM | POA: Diagnosis not present

## 2022-06-21 DIAGNOSIS — Z8719 Personal history of other diseases of the digestive system: Secondary | ICD-10-CM | POA: Diagnosis not present

## 2022-06-21 DIAGNOSIS — R748 Abnormal levels of other serum enzymes: Secondary | ICD-10-CM | POA: Diagnosis not present

## 2022-06-21 DIAGNOSIS — K838 Other specified diseases of biliary tract: Secondary | ICD-10-CM | POA: Diagnosis not present

## 2022-06-21 DIAGNOSIS — K7689 Other specified diseases of liver: Secondary | ICD-10-CM | POA: Diagnosis not present

## 2022-06-21 DIAGNOSIS — K219 Gastro-esophageal reflux disease without esophagitis: Secondary | ICD-10-CM | POA: Diagnosis not present

## 2022-06-21 DIAGNOSIS — R17 Unspecified jaundice: Secondary | ICD-10-CM | POA: Diagnosis not present

## 2022-06-21 DIAGNOSIS — Z886 Allergy status to analgesic agent status: Secondary | ICD-10-CM | POA: Diagnosis not present

## 2022-06-21 DIAGNOSIS — J45909 Unspecified asthma, uncomplicated: Secondary | ICD-10-CM | POA: Diagnosis not present

## 2022-06-21 DIAGNOSIS — Z79899 Other long term (current) drug therapy: Secondary | ICD-10-CM | POA: Diagnosis not present

## 2022-06-21 DIAGNOSIS — R932 Abnormal findings on diagnostic imaging of liver and biliary tract: Secondary | ICD-10-CM | POA: Diagnosis not present

## 2022-06-21 DIAGNOSIS — Z7951 Long term (current) use of inhaled steroids: Secondary | ICD-10-CM | POA: Diagnosis not present

## 2022-06-21 DIAGNOSIS — K831 Obstruction of bile duct: Secondary | ICD-10-CM | POA: Diagnosis not present

## 2022-06-21 DIAGNOSIS — K8050 Calculus of bile duct without cholangitis or cholecystitis without obstruction: Secondary | ICD-10-CM | POA: Diagnosis not present

## 2022-06-21 DIAGNOSIS — R7401 Elevation of levels of liver transaminase levels: Secondary | ICD-10-CM | POA: Diagnosis not present

## 2022-06-21 DIAGNOSIS — Z6841 Body Mass Index (BMI) 40.0 and over, adult: Secondary | ICD-10-CM | POA: Diagnosis not present

## 2022-06-30 DIAGNOSIS — K831 Obstruction of bile duct: Secondary | ICD-10-CM | POA: Diagnosis not present

## 2022-07-02 ENCOUNTER — Ambulatory Visit (HOSPITAL_COMMUNITY): Payer: BC Managed Care – PPO

## 2022-07-07 NOTE — Progress Notes (Signed)
04/08/22- 46 yoF patient of Dr Shearon Stalls, followed for hx CAP with complex parapneumonic effusion, fibrinolysis/ chest tube, Asthma Medical problem list includes Hemorrhagic Gastritis, Cholangitis -Ventolin hfa, Tessalon, Advair 250, Allegra,  Body weight today- Covid vax- Flu - -----Still having chest pain on the left and now towards the middle  Hosp in early Sept with CAP/ effusions, > Augmentin Asthma had been managed with Trelegy de-escalated to Advair. She is out of her inhalers. Not coughing, no phlegm or wheeze currently. She describes chest pains vaguely, not fixed in location and not constant but somewhat movement related. Might be pleuritic. CXR 01/21/22- IMPRESSION: Similar mild bibasilar opacities, which could represent atelectasis and/or pneumonia. No visible pleural effusions.  07/08/22- [name] of Dr Shearon Stalls, followed for hx CAP with complex parapneumonic effusion, fibrinolysis/ chest tube, Asthma Medical problem list includes Hemorrhagic Gastritis, Cholangitis -Ventolin hfa, Tessalon, Advair 250, Allegra,  Body weight today-255 lbs Covid vax- 2 Phizer Flu -had Eval by [facility] for intrahepatic bile duct disease/ cholangitis. Since Covid infection in December, she still has productive cough/ green lying down. PCP gave Zpak in Feb "no help". Denies fever, nodes, blood.   ROS-see HPI   + = positive Constitutional:    weight loss, night sweats, fevers, chills, fatigue, lassitude. HEENT:    headaches, difficulty swallowing, tooth/dental problems, sore throat,       sneezing, itching, ear ache, nasal congestion, post nasal drip, snoring CV:    +chest pain, orthopnea, PND, swelling in lower extremities, anasarca,                                   dizziness, palpitations Resp:   shortness of breath with exertion or at rest.                +productive cough,   non-productive cough, coughing up of blood.              +change in color of mucus.  wheezing.   Skin:    rash or  lesions. GI:  No-   heartburn, indigestion, abdominal pain, nausea, vomiting, diarrhea,                 change in bowel habits, loss of appetite GU: dysuria, change in color of urine, no urgency or frequency.   flank pain. MS:   joint pain, stiffness, decreased range of motion, back pain. Neuro-     nothing unusual Psych:  change in mood or affect.  depression or anxiety.   memory loss.  OBJ- Physical Exam General- Alert, Oriented, Affect-appropriate, Distress- none evident, +obese Skin- rash-none, lesions- none, excoriation- none Lymphadenopathy- none Head- atraumatic            Eyes- Gross vision intact, PERRLA, conjunctivae and secretions clear            Ears- Hearing, canals-normal            Nose- Clear, no-Septal dev, mucus, polyps, erosion, perforation             Throat- Mallampati II , mucosa clear , drainage- none, tonsils- atrophic Neck- flexible , trachea midline, no stridor , thyroid nl, carotid no bruit Chest - symmetrical excursion , unlabored           Heart/CV- RRR , no murmur , no gallop  , no rub, nl s1 s2                           -  JVD- none , edema- none, stasis changes- none, varices- none           Lung- wheeze+mild, cough- none , dullness-none, rub- none           Chest wall-  Abd-  Br/ Gen/ Rectal- Not done, not indicated Extrem- cyanosis- none, clubbing, none, atrophy- none, strength- nl Neuro- grossly intact to observation

## 2022-07-08 ENCOUNTER — Ambulatory Visit: Payer: BC Managed Care – PPO | Admitting: Internal Medicine

## 2022-07-08 ENCOUNTER — Ambulatory Visit (INDEPENDENT_AMBULATORY_CARE_PROVIDER_SITE_OTHER): Payer: BC Managed Care – PPO

## 2022-07-08 ENCOUNTER — Encounter: Payer: Self-pay | Admitting: Internal Medicine

## 2022-07-08 VITALS — BP 126/78 | HR 76 | Ht 66.0 in | Wt 255.8 lb

## 2022-07-08 DIAGNOSIS — J4541 Moderate persistent asthma with (acute) exacerbation: Secondary | ICD-10-CM | POA: Diagnosis not present

## 2022-07-08 DIAGNOSIS — J40 Bronchitis, not specified as acute or chronic: Secondary | ICD-10-CM

## 2022-07-08 DIAGNOSIS — R918 Other nonspecific abnormal finding of lung field: Secondary | ICD-10-CM | POA: Diagnosis not present

## 2022-07-08 DIAGNOSIS — K8301 Primary sclerosing cholangitis: Secondary | ICD-10-CM | POA: Diagnosis not present

## 2022-07-08 MED ORDER — BENZONATATE 100 MG PO CAPS: 100.0000 mg | ORAL_CAPSULE | Freq: Four times a day (QID) | ORAL | 3 refills | Status: DC | PRN

## 2022-07-08 MED ORDER — DOXYCYCLINE HYCLATE 100 MG PO TABS: 100.0000 mg | ORAL_TABLET | Freq: Two times a day (BID) | ORAL | 0 refills | Status: DC

## 2022-07-08 MED ORDER — BREZTRI AEROSPHERE 160-9-4.8 MCG/ACT IN AERO: 2.0000 | INHALATION_SPRAY | Freq: Two times a day (BID) | RESPIRATORY_TRACT | 0 refills | Status: DC

## 2022-07-08 NOTE — Patient Instructions (Addendum)
Order- CXR   dx bronchitis post-covid  Script sent for doxycycline antibiotic and tessalon perles  Order- sample x 2 Breztri inhaler    inhale 2puffs then rinse mouth, twice daily. Try this instead of Advair for now. Go back to Advair after samples run out.

## 2022-07-09 DIAGNOSIS — D734 Cyst of spleen: Secondary | ICD-10-CM | POA: Diagnosis not present

## 2022-07-09 DIAGNOSIS — E669 Obesity, unspecified: Secondary | ICD-10-CM | POA: Diagnosis not present

## 2022-07-09 DIAGNOSIS — Z6841 Body Mass Index (BMI) 40.0 and over, adult: Secondary | ICD-10-CM | POA: Diagnosis not present

## 2022-07-09 DIAGNOSIS — J45909 Unspecified asthma, uncomplicated: Secondary | ICD-10-CM | POA: Diagnosis not present

## 2022-07-09 DIAGNOSIS — Z79899 Other long term (current) drug therapy: Secondary | ICD-10-CM | POA: Diagnosis not present

## 2022-07-09 DIAGNOSIS — I700 Atherosclerosis of aorta: Secondary | ICD-10-CM | POA: Diagnosis not present

## 2022-07-09 DIAGNOSIS — K838 Other specified diseases of biliary tract: Secondary | ICD-10-CM | POA: Diagnosis not present

## 2022-07-09 DIAGNOSIS — K831 Obstruction of bile duct: Secondary | ICD-10-CM | POA: Diagnosis not present

## 2022-07-09 DIAGNOSIS — R978 Other abnormal tumor markers: Secondary | ICD-10-CM | POA: Diagnosis not present

## 2022-07-12 ENCOUNTER — Other Ambulatory Visit (HOSPITAL_COMMUNITY): Payer: BC Managed Care – PPO

## 2022-07-20 DIAGNOSIS — Z8616 Personal history of COVID-19: Secondary | ICD-10-CM | POA: Diagnosis not present

## 2022-07-20 DIAGNOSIS — Z79899 Other long term (current) drug therapy: Secondary | ICD-10-CM | POA: Diagnosis not present

## 2022-07-20 DIAGNOSIS — J45909 Unspecified asthma, uncomplicated: Secondary | ICD-10-CM | POA: Diagnosis not present

## 2022-07-20 DIAGNOSIS — G8918 Other acute postprocedural pain: Secondary | ICD-10-CM | POA: Diagnosis not present

## 2022-07-20 DIAGNOSIS — Z886 Allergy status to analgesic agent status: Secondary | ICD-10-CM | POA: Diagnosis not present

## 2022-07-20 DIAGNOSIS — Z9689 Presence of other specified functional implants: Secondary | ICD-10-CM | POA: Diagnosis not present

## 2022-07-20 DIAGNOSIS — R945 Abnormal results of liver function studies: Secondary | ICD-10-CM | POA: Diagnosis not present

## 2022-07-20 DIAGNOSIS — Z975 Presence of (intrauterine) contraceptive device: Secondary | ICD-10-CM | POA: Diagnosis not present

## 2022-07-20 DIAGNOSIS — Z7951 Long term (current) use of inhaled steroids: Secondary | ICD-10-CM | POA: Diagnosis not present

## 2022-07-20 DIAGNOSIS — Z6841 Body Mass Index (BMI) 40.0 and over, adult: Secondary | ICD-10-CM | POA: Diagnosis not present

## 2022-07-20 DIAGNOSIS — Z01818 Encounter for other preprocedural examination: Secondary | ICD-10-CM | POA: Diagnosis not present

## 2022-07-20 DIAGNOSIS — R109 Unspecified abdominal pain: Secondary | ICD-10-CM | POA: Diagnosis not present

## 2022-07-20 DIAGNOSIS — F419 Anxiety disorder, unspecified: Secondary | ICD-10-CM | POA: Diagnosis not present

## 2022-07-20 DIAGNOSIS — K8044 Calculus of bile duct with chronic cholecystitis without obstruction: Secondary | ICD-10-CM | POA: Diagnosis not present

## 2022-07-20 DIAGNOSIS — Z91048 Other nonmedicinal substance allergy status: Secondary | ICD-10-CM | POA: Diagnosis not present

## 2022-07-20 DIAGNOSIS — F32A Depression, unspecified: Secondary | ICD-10-CM | POA: Diagnosis not present

## 2022-07-20 DIAGNOSIS — K831 Obstruction of bile duct: Secondary | ICD-10-CM | POA: Diagnosis not present

## 2022-07-20 DIAGNOSIS — K8050 Calculus of bile duct without cholangitis or cholecystitis without obstruction: Secondary | ICD-10-CM | POA: Diagnosis not present

## 2022-07-20 DIAGNOSIS — K8051 Calculus of bile duct without cholangitis or cholecystitis with obstruction: Secondary | ICD-10-CM | POA: Diagnosis not present

## 2022-07-28 ENCOUNTER — Telehealth: Payer: Self-pay | Admitting: Internal Medicine

## 2022-07-29 NOTE — Assessment & Plan Note (Signed)
Plan- CXR, doxycycline, Tessalon

## 2022-07-29 NOTE — Assessment & Plan Note (Signed)
Being evaluated at [facility].

## 2022-08-05 DIAGNOSIS — J9811 Atelectasis: Secondary | ICD-10-CM | POA: Diagnosis not present

## 2022-08-05 DIAGNOSIS — Z975 Presence of (intrauterine) contraceptive device: Secondary | ICD-10-CM | POA: Diagnosis not present

## 2022-08-05 DIAGNOSIS — D72829 Elevated white blood cell count, unspecified: Secondary | ICD-10-CM | POA: Diagnosis not present

## 2022-08-05 DIAGNOSIS — R109 Unspecified abdominal pain: Secondary | ICD-10-CM | POA: Diagnosis not present

## 2022-08-05 DIAGNOSIS — R590 Localized enlarged lymph nodes: Secondary | ICD-10-CM | POA: Diagnosis not present

## 2022-08-05 DIAGNOSIS — K750 Abscess of liver: Secondary | ICD-10-CM | POA: Diagnosis not present

## 2022-08-05 DIAGNOSIS — Z79899 Other long term (current) drug therapy: Secondary | ICD-10-CM | POA: Diagnosis not present

## 2022-08-05 DIAGNOSIS — R0789 Other chest pain: Secondary | ICD-10-CM | POA: Diagnosis not present

## 2022-08-05 DIAGNOSIS — Z4659 Encounter for fitting and adjustment of other gastrointestinal appliance and device: Secondary | ICD-10-CM | POA: Diagnosis not present

## 2022-08-05 DIAGNOSIS — J45909 Unspecified asthma, uncomplicated: Secondary | ICD-10-CM | POA: Diagnosis not present

## 2022-08-05 DIAGNOSIS — R079 Chest pain, unspecified: Secondary | ICD-10-CM | POA: Diagnosis not present

## 2022-08-05 DIAGNOSIS — K831 Obstruction of bile duct: Secondary | ICD-10-CM | POA: Diagnosis not present

## 2022-08-05 DIAGNOSIS — K838 Other specified diseases of biliary tract: Secondary | ICD-10-CM | POA: Diagnosis not present

## 2022-08-05 DIAGNOSIS — E871 Hypo-osmolality and hyponatremia: Secondary | ICD-10-CM | POA: Diagnosis not present

## 2022-08-05 DIAGNOSIS — Z6841 Body Mass Index (BMI) 40.0 and over, adult: Secondary | ICD-10-CM | POA: Diagnosis not present

## 2022-08-05 DIAGNOSIS — R197 Diarrhea, unspecified: Secondary | ICD-10-CM | POA: Diagnosis not present

## 2022-08-19 DIAGNOSIS — Z7951 Long term (current) use of inhaled steroids: Secondary | ICD-10-CM | POA: Diagnosis not present

## 2022-08-19 DIAGNOSIS — Z48815 Encounter for surgical aftercare following surgery on the digestive system: Secondary | ICD-10-CM | POA: Diagnosis not present

## 2022-08-19 DIAGNOSIS — K8309 Other cholangitis: Secondary | ICD-10-CM | POA: Diagnosis not present

## 2022-08-19 DIAGNOSIS — J45909 Unspecified asthma, uncomplicated: Secondary | ICD-10-CM | POA: Diagnosis not present

## 2022-08-19 DIAGNOSIS — Z434 Encounter for attention to other artificial openings of digestive tract: Secondary | ICD-10-CM | POA: Diagnosis not present

## 2022-08-19 DIAGNOSIS — Z4803 Encounter for change or removal of drains: Secondary | ICD-10-CM | POA: Diagnosis not present

## 2022-08-19 DIAGNOSIS — Z6839 Body mass index (BMI) 39.0-39.9, adult: Secondary | ICD-10-CM | POA: Diagnosis not present

## 2022-08-19 DIAGNOSIS — Z9049 Acquired absence of other specified parts of digestive tract: Secondary | ICD-10-CM | POA: Diagnosis not present

## 2022-08-23 DIAGNOSIS — Z9049 Acquired absence of other specified parts of digestive tract: Secondary | ICD-10-CM | POA: Diagnosis not present

## 2022-08-23 DIAGNOSIS — Z434 Encounter for attention to other artificial openings of digestive tract: Secondary | ICD-10-CM | POA: Diagnosis not present

## 2022-08-23 DIAGNOSIS — Z7951 Long term (current) use of inhaled steroids: Secondary | ICD-10-CM | POA: Diagnosis not present

## 2022-08-23 DIAGNOSIS — K8309 Other cholangitis: Secondary | ICD-10-CM | POA: Diagnosis not present

## 2022-08-23 DIAGNOSIS — Z48815 Encounter for surgical aftercare following surgery on the digestive system: Secondary | ICD-10-CM | POA: Diagnosis not present

## 2022-08-23 DIAGNOSIS — Z6839 Body mass index (BMI) 39.0-39.9, adult: Secondary | ICD-10-CM | POA: Diagnosis not present

## 2022-08-23 DIAGNOSIS — Z4803 Encounter for change or removal of drains: Secondary | ICD-10-CM | POA: Diagnosis not present

## 2022-08-23 DIAGNOSIS — J45909 Unspecified asthma, uncomplicated: Secondary | ICD-10-CM | POA: Diagnosis not present

## 2022-08-26 DIAGNOSIS — Z7951 Long term (current) use of inhaled steroids: Secondary | ICD-10-CM | POA: Diagnosis not present

## 2022-08-26 DIAGNOSIS — Z4803 Encounter for change or removal of drains: Secondary | ICD-10-CM | POA: Diagnosis not present

## 2022-08-26 DIAGNOSIS — Z48815 Encounter for surgical aftercare following surgery on the digestive system: Secondary | ICD-10-CM | POA: Diagnosis not present

## 2022-08-26 DIAGNOSIS — K8309 Other cholangitis: Secondary | ICD-10-CM | POA: Diagnosis not present

## 2022-08-26 DIAGNOSIS — J45909 Unspecified asthma, uncomplicated: Secondary | ICD-10-CM | POA: Diagnosis not present

## 2022-08-26 DIAGNOSIS — Z9049 Acquired absence of other specified parts of digestive tract: Secondary | ICD-10-CM | POA: Diagnosis not present

## 2022-08-26 DIAGNOSIS — Z6839 Body mass index (BMI) 39.0-39.9, adult: Secondary | ICD-10-CM | POA: Diagnosis not present

## 2022-08-26 DIAGNOSIS — Z434 Encounter for attention to other artificial openings of digestive tract: Secondary | ICD-10-CM | POA: Diagnosis not present

## 2022-08-26 NOTE — Telephone Encounter (Signed)
NFN 

## 2022-08-27 DIAGNOSIS — K8050 Calculus of bile duct without cholangitis or cholecystitis without obstruction: Secondary | ICD-10-CM | POA: Diagnosis not present

## 2022-08-27 DIAGNOSIS — K8051 Calculus of bile duct without cholangitis or cholecystitis with obstruction: Secondary | ICD-10-CM | POA: Diagnosis not present

## 2022-08-30 DIAGNOSIS — Z6839 Body mass index (BMI) 39.0-39.9, adult: Secondary | ICD-10-CM | POA: Diagnosis not present

## 2022-08-30 DIAGNOSIS — K8309 Other cholangitis: Secondary | ICD-10-CM | POA: Diagnosis not present

## 2022-08-30 DIAGNOSIS — J45909 Unspecified asthma, uncomplicated: Secondary | ICD-10-CM | POA: Diagnosis not present

## 2022-08-30 DIAGNOSIS — Z434 Encounter for attention to other artificial openings of digestive tract: Secondary | ICD-10-CM | POA: Diagnosis not present

## 2022-08-30 DIAGNOSIS — Z48815 Encounter for surgical aftercare following surgery on the digestive system: Secondary | ICD-10-CM | POA: Diagnosis not present

## 2022-08-30 DIAGNOSIS — Z7951 Long term (current) use of inhaled steroids: Secondary | ICD-10-CM | POA: Diagnosis not present

## 2022-08-30 DIAGNOSIS — Z4803 Encounter for change or removal of drains: Secondary | ICD-10-CM | POA: Diagnosis not present

## 2022-08-30 DIAGNOSIS — Z9049 Acquired absence of other specified parts of digestive tract: Secondary | ICD-10-CM | POA: Diagnosis not present

## 2022-09-03 DIAGNOSIS — Z4803 Encounter for change or removal of drains: Secondary | ICD-10-CM | POA: Diagnosis not present

## 2022-09-03 DIAGNOSIS — Z434 Encounter for attention to other artificial openings of digestive tract: Secondary | ICD-10-CM | POA: Diagnosis not present

## 2022-09-03 DIAGNOSIS — J45909 Unspecified asthma, uncomplicated: Secondary | ICD-10-CM | POA: Diagnosis not present

## 2022-09-03 DIAGNOSIS — Z6839 Body mass index (BMI) 39.0-39.9, adult: Secondary | ICD-10-CM | POA: Diagnosis not present

## 2022-09-03 DIAGNOSIS — Z7951 Long term (current) use of inhaled steroids: Secondary | ICD-10-CM | POA: Diagnosis not present

## 2022-09-03 DIAGNOSIS — Z48815 Encounter for surgical aftercare following surgery on the digestive system: Secondary | ICD-10-CM | POA: Diagnosis not present

## 2022-09-03 DIAGNOSIS — Z9049 Acquired absence of other specified parts of digestive tract: Secondary | ICD-10-CM | POA: Diagnosis not present

## 2022-09-03 DIAGNOSIS — K8309 Other cholangitis: Secondary | ICD-10-CM | POA: Diagnosis not present

## 2022-09-06 DIAGNOSIS — S31000A Unspecified open wound of lower back and pelvis without penetration into retroperitoneum, initial encounter: Secondary | ICD-10-CM | POA: Diagnosis not present

## 2022-09-06 DIAGNOSIS — T8131XD Disruption of external operation (surgical) wound, not elsewhere classified, subsequent encounter: Secondary | ICD-10-CM | POA: Diagnosis not present

## 2022-09-06 DIAGNOSIS — S31109A Unspecified open wound of abdominal wall, unspecified quadrant without penetration into peritoneal cavity, initial encounter: Secondary | ICD-10-CM | POA: Diagnosis not present

## 2022-09-06 DIAGNOSIS — Z4801 Encounter for change or removal of surgical wound dressing: Secondary | ICD-10-CM | POA: Diagnosis not present

## 2022-09-07 DIAGNOSIS — K8051 Calculus of bile duct without cholangitis or cholecystitis with obstruction: Secondary | ICD-10-CM | POA: Diagnosis not present

## 2022-09-07 DIAGNOSIS — Z4803 Encounter for change or removal of drains: Secondary | ICD-10-CM | POA: Diagnosis not present

## 2022-09-07 DIAGNOSIS — Z4659 Encounter for fitting and adjustment of other gastrointestinal appliance and device: Secondary | ICD-10-CM | POA: Diagnosis not present

## 2022-09-08 DIAGNOSIS — Z9049 Acquired absence of other specified parts of digestive tract: Secondary | ICD-10-CM | POA: Diagnosis not present

## 2022-09-08 DIAGNOSIS — T8131XD Disruption of external operation (surgical) wound, not elsewhere classified, subsequent encounter: Secondary | ICD-10-CM | POA: Diagnosis not present

## 2022-09-08 DIAGNOSIS — S31109A Unspecified open wound of abdominal wall, unspecified quadrant without penetration into peritoneal cavity, initial encounter: Secondary | ICD-10-CM | POA: Diagnosis not present

## 2022-09-08 DIAGNOSIS — Z7951 Long term (current) use of inhaled steroids: Secondary | ICD-10-CM | POA: Diagnosis not present

## 2022-09-08 DIAGNOSIS — Z434 Encounter for attention to other artificial openings of digestive tract: Secondary | ICD-10-CM | POA: Diagnosis not present

## 2022-09-08 DIAGNOSIS — S31000A Unspecified open wound of lower back and pelvis without penetration into retroperitoneum, initial encounter: Secondary | ICD-10-CM | POA: Diagnosis not present

## 2022-09-08 DIAGNOSIS — Z4803 Encounter for change or removal of drains: Secondary | ICD-10-CM | POA: Diagnosis not present

## 2022-09-08 DIAGNOSIS — Z6839 Body mass index (BMI) 39.0-39.9, adult: Secondary | ICD-10-CM | POA: Diagnosis not present

## 2022-09-08 DIAGNOSIS — K8309 Other cholangitis: Secondary | ICD-10-CM | POA: Diagnosis not present

## 2022-09-08 DIAGNOSIS — Z48815 Encounter for surgical aftercare following surgery on the digestive system: Secondary | ICD-10-CM | POA: Diagnosis not present

## 2022-09-08 DIAGNOSIS — Z4801 Encounter for change or removal of surgical wound dressing: Secondary | ICD-10-CM | POA: Diagnosis not present

## 2022-09-08 DIAGNOSIS — J45909 Unspecified asthma, uncomplicated: Secondary | ICD-10-CM | POA: Diagnosis not present

## 2022-09-15 DIAGNOSIS — K8309 Other cholangitis: Secondary | ICD-10-CM | POA: Diagnosis not present

## 2022-09-15 DIAGNOSIS — Z7951 Long term (current) use of inhaled steroids: Secondary | ICD-10-CM | POA: Diagnosis not present

## 2022-09-15 DIAGNOSIS — Z6839 Body mass index (BMI) 39.0-39.9, adult: Secondary | ICD-10-CM | POA: Diagnosis not present

## 2022-09-15 DIAGNOSIS — Z434 Encounter for attention to other artificial openings of digestive tract: Secondary | ICD-10-CM | POA: Diagnosis not present

## 2022-09-15 DIAGNOSIS — Z9049 Acquired absence of other specified parts of digestive tract: Secondary | ICD-10-CM | POA: Diagnosis not present

## 2022-09-15 DIAGNOSIS — Z4803 Encounter for change or removal of drains: Secondary | ICD-10-CM | POA: Diagnosis not present

## 2022-09-15 DIAGNOSIS — Z48815 Encounter for surgical aftercare following surgery on the digestive system: Secondary | ICD-10-CM | POA: Diagnosis not present

## 2022-09-15 DIAGNOSIS — J45909 Unspecified asthma, uncomplicated: Secondary | ICD-10-CM | POA: Diagnosis not present

## 2022-09-17 DIAGNOSIS — K8051 Calculus of bile duct without cholangitis or cholecystitis with obstruction: Secondary | ICD-10-CM | POA: Diagnosis not present

## 2022-09-17 DIAGNOSIS — K831 Obstruction of bile duct: Secondary | ICD-10-CM | POA: Diagnosis not present

## 2022-09-17 DIAGNOSIS — K8050 Calculus of bile duct without cholangitis or cholecystitis without obstruction: Secondary | ICD-10-CM | POA: Diagnosis not present

## 2022-10-01 DIAGNOSIS — Z4589 Encounter for adjustment and management of other implanted devices: Secondary | ICD-10-CM | POA: Diagnosis not present

## 2022-10-01 DIAGNOSIS — K8051 Calculus of bile duct without cholangitis or cholecystitis with obstruction: Secondary | ICD-10-CM | POA: Diagnosis not present

## 2022-10-01 DIAGNOSIS — Z4659 Encounter for fitting and adjustment of other gastrointestinal appliance and device: Secondary | ICD-10-CM | POA: Diagnosis not present

## 2022-10-20 ENCOUNTER — Emergency Department (HOSPITAL_BASED_OUTPATIENT_CLINIC_OR_DEPARTMENT_OTHER)
Admission: EM | Admit: 2022-10-20 | Discharge: 2022-10-21 | Disposition: A | Discharge status: Home / Self Care | Payer: 59 | Attending: Emergency Medicine | Admitting: Emergency Medicine

## 2022-10-20 ENCOUNTER — Other Ambulatory Visit: Payer: Self-pay

## 2022-10-20 ENCOUNTER — Encounter (HOSPITAL_BASED_OUTPATIENT_CLINIC_OR_DEPARTMENT_OTHER): Payer: Self-pay

## 2022-10-20 ENCOUNTER — Emergency Department (HOSPITAL_BASED_OUTPATIENT_CLINIC_OR_DEPARTMENT_OTHER): Payer: 59 | Admitting: Radiology

## 2022-10-20 ENCOUNTER — Ambulatory Visit
Admission: EM | Admit: 2022-10-20 | Discharge: 2022-10-20 | Disposition: A | Discharge status: Home / Self Care | Payer: 59

## 2022-10-20 DIAGNOSIS — M25511 Pain in right shoulder: Secondary | ICD-10-CM

## 2022-10-20 DIAGNOSIS — R091 Pleurisy: Secondary | ICD-10-CM | POA: Diagnosis not present

## 2022-10-20 DIAGNOSIS — R053 Chronic cough: Secondary | ICD-10-CM | POA: Diagnosis not present

## 2022-10-20 DIAGNOSIS — M549 Dorsalgia, unspecified: Secondary | ICD-10-CM

## 2022-10-20 DIAGNOSIS — R7981 Abnormal blood-gas level: Secondary | ICD-10-CM | POA: Diagnosis not present

## 2022-10-20 DIAGNOSIS — J189 Pneumonia, unspecified organism: Secondary | ICD-10-CM

## 2022-10-20 DIAGNOSIS — R0602 Shortness of breath: Secondary | ICD-10-CM | POA: Diagnosis not present

## 2022-10-20 LAB — CBC WITH DIFFERENTIAL/PLATELET
Abs Immature Granulocytes: 0.06 10*3/uL (ref 0.00–0.07)
Basophils Absolute: 0.1 10*3/uL (ref 0.0–0.1)
Basophils Relative: 1 %
Eosinophils Absolute: 0.9 10*3/uL — ABNORMAL HIGH (ref 0.0–0.5)
Eosinophils Relative: 8 %
HCT: 32.4 % — ABNORMAL LOW (ref 36.0–46.0)
Hemoglobin: 10.5 g/dL — ABNORMAL LOW (ref 12.0–15.0)
Immature Granulocytes: 1 %
Lymphocytes Relative: 11 %
Lymphs Abs: 1.2 10*3/uL (ref 0.7–4.0)
MCH: 27.9 pg (ref 26.0–34.0)
MCHC: 32.4 g/dL (ref 30.0–36.0)
MCV: 85.9 fL (ref 80.0–100.0)
Monocytes Absolute: 1.1 10*3/uL — ABNORMAL HIGH (ref 0.1–1.0)
Monocytes Relative: 9 %
Neutro Abs: 8.1 10*3/uL — ABNORMAL HIGH (ref 1.7–7.7)
Neutrophils Relative %: 70 %
Platelets: 357 10*3/uL (ref 150–400)
RBC: 3.77 MIL/uL — ABNORMAL LOW (ref 3.87–5.11)
RDW: 18.2 % — ABNORMAL HIGH (ref 11.5–15.5)
WBC: 11.3 10*3/uL — ABNORMAL HIGH (ref 4.0–10.5)
nRBC: 0 % (ref 0.0–0.2)

## 2022-10-20 LAB — BASIC METABOLIC PANEL
Anion gap: 7 (ref 5–15)
BUN: 12 mg/dL (ref 6–20)
CO2: 29 mmol/L (ref 22–32)
Calcium: 9.6 mg/dL (ref 8.9–10.3)
Chloride: 97 mmol/L — ABNORMAL LOW (ref 98–111)
Creatinine, Ser: 0.68 mg/dL (ref 0.44–1.00)
GFR, Estimated: >60 mL/min (ref >60)
Glucose, Bld: 110 mg/dL — ABNORMAL HIGH (ref 70–99)
Potassium: 4.5 mmol/L (ref 3.5–5.1)
Sodium: 133 mmol/L — ABNORMAL LOW (ref 135–145)

## 2022-10-20 LAB — D-DIMER, QUANTITATIVE: D-Dimer, Quant: 1.66 ug/mL-FEU — ABNORMAL HIGH (ref 0.00–0.50)

## 2022-10-20 MED ORDER — IPRATROPIUM BROMIDE 0.02 % IN SOLN
0.5000 mg | Freq: Once | RESPIRATORY_TRACT | Status: AC
  Administered 2022-10-20: 0.5 mg via RESPIRATORY_TRACT
  Filled 2022-10-20: qty 2.5

## 2022-10-20 MED ORDER — ALBUTEROL (5 MG/ML) CONTINUOUS INHALATION SOLN: 10.0000 mg/h | INHALATION_SOLUTION | Freq: Once | RESPIRATORY_TRACT | Status: DC

## 2022-10-20 MED ORDER — ALBUTEROL SULFATE (2.5 MG/3ML) 0.083% IN NEBU
10.0000 mg | INHALATION_SOLUTION | Freq: Once | RESPIRATORY_TRACT | Status: AC
  Administered 2022-10-20: 10 mg via RESPIRATORY_TRACT
  Filled 2022-10-20: qty 12

## 2022-10-20 MED ORDER — LACTATED RINGERS IV BOLUS
1000.0000 mL | Freq: Once | INTRAVENOUS | Status: AC
  Administered 2022-10-20: 1000 mL via INTRAVENOUS

## 2022-10-20 NOTE — ED Notes (Signed)
Patient is being discharged from the Urgent Care and sent to the Emergency Department via POV. Per Ervin Knack, NP. Patient is in need of higher level of care due to Low O2 sats, SOB, Right upper back and shoulder pain when breathing. Patient is aware and verbalizes understanding of plan of care.  Vitals:   10/20/22 1813  BP: (!) 94/54  Pulse: (!) 101  Resp: 20  Temp: 98.2 F (36.8 C)  SpO2: 93%

## 2022-10-20 NOTE — Discharge Instructions (Signed)
Go straight to the emergency department as soon as you leave urgent care for further evaluation and management. 

## 2022-10-20 NOTE — ED Triage Notes (Signed)
Pt c/o right shoulder and upper back pain started in her back when she would breath deeply then moved to her shoulder. Pt states she had a CT scan on Tuesday, that says she had "broncilitis". But she was untreat because it was her gastro that order th CT.

## 2022-10-20 NOTE — ED Provider Notes (Signed)
[facility]    CSN: [id number] Arrival date & time: 10/20/22  1807      History   Chief Complaint No chief complaint on file.   HPI Monique Harrison is a 47 y.o. female.   Patient presents today due to new onset right shoulder pain and right upper back pain.  Reports the back pain started yesterday and shoulder pain started today.  Reports that shoulder pain only occurs when she takes a deep breath.  Reports that she is concerned is related to a cough that she has had for several months.  Reports that she has not yet been evaluated for the cough.  Cough is productive with green sputum.  Intermittent shortness of breath is present as well which started today.  Reports that she has history of asthma and used an albuterol breathing treatment today with no improvement.  Denies any associated upper respiratory symptoms, nausea, vomiting, diarrhea.  Patient is being followed by GI specialty given recent cholangiogram and intrahepatic bile duct obstruction.  She states that she does have abdominal pain but has been unchanged since symptoms started. She has a drain catheter in place on the right side.      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   E coli bacteremia 02/10/2018   Cyst of spleen    Right upper quadrant abdominal tenderness without rebound tenderness    Elevated LFTs    Hemorrhagic gastritis 02/08/2018   Hypotension 02/07/2018   Gastritis with hemorrhage  02/07/2018   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   Abdominal pain 02/05/2018   Asthmatic bronchitis with exacerbation, moderate persistent 03/31/2017    Past Surgical History:  Procedure Laterality Date   BIOPSY  02/07/2018   Procedure: BIOPSY;  Surgeon: Tressia Danas, MD;  Location: [facility] ENDOSCOPY;  Service: Gastroenterology;;   CHOLECYSTECTOMY, LAPAROSCOPIC     ESOPHAGOGASTRODUODENOSCOPY (EGD) WITH PROPOFOL N/A 02/07/2018   Procedure: ESOPHAGOGASTRODUODENOSCOPY (EGD) WITH PROPOFOL;  Surgeon: Tressia Danas, MD;  Location: [facility] ENDOSCOPY;  Service: Gastroenterology;  Laterality: N/A;   LIVER SURGERY     NO PAST SURGERIES      OB History   No obstetric history on file.      Home Medications    Prior to Admission medications   Medication Sig Start Date End Date Taking? Authorizing Provider  albuterol (VENTOLIN HFA) 108 (90 Base) MCG/ACT inhaler Inhale 2 puffs into the lungs every 4 (four) hours as needed. 04/08/22   Waymon Budge, MD  amoxicillin-clavulanate (AUGMENTIN) 875-125 MG tablet Take 1 tablet by mouth every 12 (twelve) hours. 04/09/22   Tomi Bamberger, PA-C  benzonatate (TESSALON PERLES) 100 MG capsule Take 1 capsule (100 mg total) by mouth 3 (three) times daily as needed for cough. 12/17/21   Alfonse Spruce, MD  benzonatate (TESSALON) 100 MG capsule Take 1 capsule (100 mg total) by mouth every 6 (six) hours as needed for cough. 07/08/22   Waymon Budge, MD  Budeson-Glycopyrrol-Formoterol (BREZTRI AEROSPHERE) 160-9-4.8 MCG/ACT AERO Inhale 2 puffs into the lungs in the morning and at bedtime. 07/08/22   Jetty Duhamel D, MD  Cholecalciferol (VITAMIN D3) 125 MCG (5000 UT) CAPS Take 5,000 Units by mouth daily.    [provider]  doxycycline (VIBRA-TABS) 100 MG tablet Take 1 tablet (100 mg total) by mouth 2 (two) times daily. 07/08/22   Waymon Budge, MD  fexofenadine-pseudoephedrine (ALLEGRA-D 24) 180-240 MG 24 hr tablet Take 1 tablet by mouth daily  as needed (allergies, sinus pressure).    [provider]  fluticasone-salmeterol (ADVAIR DISKUS) 250-50 MCG/ACT AEPB Inhale 1 puff into the lungs in the morning and at bedtime. 04/08/22   Jetty Duhamel D, MD  HYDROmorphone (DILAUDID) 2 MG tablet Take 2 mg by mouth 3 (three) times daily.    [provider]  Magnesium 250 MG TABS Take 1 tablet by mouth daily.    [provider]  sertraline (ZOLOFT) 50 MG tablet Take 100 mg by mouth daily. 12/30/21   [provider]  ursodiol (ACTIGALL) 300 MG capsule Take 300 mg by mouth 2 (two) times daily. 11/25/20   [provider]    Family History Family History  Problem Relation Age of Onset   Allergic rhinitis Father    Breast cancer Maternal Grandmother     Social History Social History   Tobacco Use   Smoking status: Never   Smokeless tobacco: Never  Vaping Use   Vaping Use: Never used  Substance Use Topics   Alcohol use: No   Drug use: No     Allergies   Aspirin   Review of Systems Review of Systems Per HPI  Physical Exam Triage Vital Signs ED Triage Vitals  Enc Vitals Group     BP 10/20/22 1813 (!) 94/54     Pulse Rate 10/20/22 1813 (!) 101     Resp 10/20/22 1813 20     Temp 10/20/22 1813 98.2 F (36.8 C)     Temp Source 10/20/22 1813 Oral     SpO2 10/20/22 1813 93 %     Weight --      Height --      Head Circumference --      Peak Flow --      Pain Score 10/20/22 1818 8     Pain Loc --      Pain Edu? --      Excl. in GC? --    No data found.  Updated Vital Signs BP (!) 94/54 (BP Location: Left Arm)   Pulse (!) 101   Temp 98.2 F (36.8 C) (Oral)   Resp 20   SpO2 93%   Visual Acuity Right Eye Distance:   Left Eye Distance:   Bilateral Distance:    Right Eye Near:   Left Eye Near:    Bilateral Near:     Physical Exam Constitutional:      General: She is not in acute distress.    Appearance: Normal appearance. She is not toxic-appearing or diaphoretic.   HENT:     Head: Normocephalic and atraumatic.  Eyes:     Extraocular Movements: Extraocular movements intact.     Conjunctiva/sclera: Conjunctivae normal.  Cardiovascular:     Rate and Rhythm: Normal rate and regular rhythm.     Pulses: Normal pulses.     Heart sounds: Normal heart sounds.  Pulmonary:     Effort: Pulmonary effort is normal. No respiratory distress.     Breath  sounds: No stridor. Rhonchi present. No wheezing or rales.  Abdominal:     General: Bowel sounds are normal. There is no distension.     Palpations: Abdomen is soft.     Tenderness: There is no abdominal tenderness.     Comments: Dressings in place to bilateral abdomen with no drainage on dressing.   Musculoskeletal:     Comments: No tenderness to palpation to back or right shoulder.   Neurological:     General: No focal deficit present.     Mental Status: She is alert and oriented to person, place, and time. Mental status is at baseline.  Psychiatric:        Mood and Affect: Mood normal.        Behavior: Behavior normal.        Thought Content: Thought content normal.        Judgment: Judgment normal.      UC Treatments / Results  Labs (all labs ordered are listed, but only abnormal results are displayed) Labs Reviewed - No data to display  EKG   Radiology No results found.  Procedures Procedures (including critical care time)  Medications Ordered in UC Medications - No data to display  Initial Impression / Assessment and Plan / UC Course  I have reviewed the triage vital signs and the nursing notes.  Pertinent labs & imaging results that were available during my care of the patient were reviewed by me and considered in my medical decision making (see chart for details).     Patient's blood pressure is low normal and different from her baseline, and oxygen is ranging from 91 to 93% on triage and physical exam.  Patient has rhonchi adventitious lung sounds on exam and pain with  inspiration.  Therefore, this could be bronchitis versus community-acquired pneumonia.  Although, I am concerned for complications related to recent intrahepatic bowel obstruction as shoulder pain could be referred pain.  Given vital signs and patient's current symptoms, recommended that she needs more extensive evaluation than can be provided here at urgent care and that she needs to go to the ER for further evaluation and management.  She was agreeable with this plan.  Ambulance transportation recommended given vital signs but she declined this and wishes self transport. Final Clinical Impressions(s) / UC Diagnoses   Final diagnoses:  Persistent cough for 3 weeks or longer  Shortness of breath  Upper back pain on right side  Acute pain of right shoulder  Low oxygen saturation     Discharge Instructions      Go straight to the emergency department as soon as you leave urgent care for further evaluation and management.    ED Prescriptions   None    PDMP not reviewed this encounter.   Gustavus Bryant, [place] 10/20/22 [phone]

## 2022-10-20 NOTE — ED Provider Notes (Signed)
Care of the patient assumed at the change of shift. Here for pleuritic shoulder pain, similar to previous PNA. Cough for months. Borderline low SpO2 at UC, sent to the ED for evaluation. CXR is clear. Pending labs at shift change.  Physical Exam  BP 108/79   Pulse 85   Temp 98.3 F (36.8 C)   Resp 18   SpO2 94%   Physical Exam  Procedures  Procedures  ED Course / MDM    Medical Decision Making Amount and/or Complexity of Data Reviewed Labs: ordered. Radiology: ordered.  Risk Prescription drug management.   ***

## 2022-10-20 NOTE — ED Triage Notes (Signed)
Pt c/o R shoulder/ back pain onset this AM. States she's had a cough "for months," pain is worse w deep inspiration- states it feels like muscle pain. Using biofreeze for same, no relief. Pneumonia last August, advises "same pains as that time."  Seen at [facility] for same today, advised to come to ED for imaging, any necessary labs. Breathing tx at home before UC, hx asthma, states it's "usually well controlled."  CT scan Friday, advised " it showed bronchiolitis in the base of my lungs"

## 2022-10-20 NOTE — ED Provider Notes (Signed)
[place] [facility] AT [facility] Provider Note   CSN: [id number] Arrival date & time: 10/20/22  1855     History  Chief Complaint  Patient presents with   Shoulder Pain    Monique Harrison is a 47 y.o. female.  HPI    47 year old female comes in with chief complaint of right-sided shoulder pain, right-sided upper back pain.  Patient states that her symptoms started this morning, and is moderately severe.  Pain is worse with deep inspiration.  Patient has had a cough for the last several days, cough is now producing green phlegm.  She also has noted some wheezing.   Patient's past medical history is positive for pneumonia with similar symptoms in the past, she also indicates that she recently had cholecystectomy and stones removed from liver ducts in April at [facility].  Review of system is positive for shortness of breath.  Patient has no known history of coronary artery disease or CHF.  She denies any orthopnea, PND, leg swelling.  Home Medications Prior to Admission medications   Medication Sig Start Date End Date Taking? Authorizing Provider  albuterol (VENTOLIN HFA) 108 (90 Base) MCG/ACT inhaler Inhale 2 puffs into the lungs every 4 (four) hours as needed. 04/08/22   Waymon Budge, MD  amoxicillin-clavulanate (AUGMENTIN) 875-125 MG tablet Take 1 tablet by mouth every 12 (twelve) hours. 04/09/22   Tomi Bamberger, PA-C  benzonatate (TESSALON PERLES) 100 MG capsule Take 1 capsule (100 mg total) by mouth 3 (three) times daily as needed for cough. 12/17/21   Alfonse Spruce, MD  benzonatate (TESSALON) 100 MG capsule Take 1 capsule (100 mg total) by mouth every 6 (six) hours as needed for cough. 07/08/22   Jetty Duhamel D, MD  Budeson-Glycopyrrol-Formoterol (BREZTRI AEROSPHERE) 160-9-4.8 MCG/ACT AERO Inhale 2 puffs into the lungs in the morning and at bedtime. 07/08/22   Jetty Duhamel D, MD  Cholecalciferol (VITAMIN D3) 125 MCG (5000 UT) CAPS Take 5,000 Units by  mouth daily.    [provider]  doxycycline (VIBRA-TABS) 100 MG tablet Take 1 tablet (100 mg total) by mouth 2 (two) times daily. 07/08/22   Waymon Budge, MD  fexofenadine-pseudoephedrine (ALLEGRA-D 24) 180-240 MG 24 hr tablet Take 1 tablet by mouth daily as needed (allergies, sinus pressure).    [provider]  fluticasone-salmeterol (ADVAIR DISKUS) 250-50 MCG/ACT AEPB Inhale 1 puff into the lungs in the morning and at bedtime. 04/08/22   Jetty Duhamel D, MD  HYDROmorphone (DILAUDID) 2 MG tablet Take 2 mg by mouth 3 (three) times daily.    [provider]  Magnesium 250 MG TABS Take 1 tablet by mouth daily.    [provider]  sertraline (ZOLOFT) 50 MG tablet Take 100 mg by mouth daily. 12/30/21   [provider]  ursodiol (ACTIGALL) 300 MG capsule Take 300 mg by mouth 2 (two) times daily. 11/25/20   [provider]      Allergies    Aspirin    Review of Systems   Review of Systems  All other systems reviewed and are negative.   Physical Exam Updated Vital Signs BP 108/79   Pulse 85   Temp 98.3 F (36.8 C)   Resp 18   SpO2 94%  Physical Exam Vitals and nursing note reviewed.  Constitutional:      Appearance: She is well-developed.  HENT:     Head: Atraumatic.     Mouth/Throat:     Mouth: Mucous membranes are  moist.  Cardiovascular:     Rate and Rhythm: Normal rate.  Pulmonary:     Effort: Pulmonary effort is normal.     Breath sounds: Rhonchi present. No wheezing.     Comments: Rhonchorous breath sounds appreciated, right worse than left Musculoskeletal:        General: Normal range of motion.     Cervical back: Normal range of motion and neck supple.  Skin:    General: Skin is warm and dry.  Neurological:     Mental Status: She is alert and oriented to person, place, and time.     ED Results / Procedures / Treatments   Labs (all labs ordered are listed, but only abnormal results are displayed) Labs Reviewed   CBC WITH DIFFERENTIAL/PLATELET - Abnormal; Notable for the following components:      Result Value   WBC 11.3 (*)    RBC 3.77 (*)    Hemoglobin 10.5 (*)    HCT 32.4 (*)    RDW 18.2 (*)    Neutro Abs 8.1 (*)    Monocytes Absolute 1.1 (*)    Eosinophils Absolute 0.9 (*)    All other components within normal limits  BASIC METABOLIC PANEL  D-DIMER, QUANTITATIVE    EKG None  Radiology DG Chest 2 View  Result Date: 10/20/2022 CLINICAL DATA:  Upper respiratory symptoms EXAM: CHEST - 2 VIEW COMPARISON:  Chest x-ray 07/08/2022 CT of the chest 06/11/2021. FINDINGS: The heart size and mediastinal contours are within normal limits. Both lungs are clear. The visualized skeletal structures are unremarkable. Rounded peripherally calcified area in the left upper abdomen measures 8 cm corresponding to calcified splenic lesion. IMPRESSION: No active cardiopulmonary disease. Electronically Signed   By: Darliss Cheney M.D.   On: 10/20/2022 19:27    Procedures Procedures    Medications Ordered in ED Medications  lactated ringers bolus 1,000 mL (has no administration in time range)  ipratropium (ATROVENT) nebulizer solution 0.5 mg (0.5 mg Nebulization Given 10/20/22 2247)  albuterol (PROVENTIL) (2.5 MG/3ML) 0.083% nebulizer solution 10 mg (10 mg Nebulization Given 10/20/22 2247)    ED Course/ Medical Decision Making/ A&P Clinical Course as of 10/20/22 2325  Wed Oct 20, 2022  2324 CBC with mild leukocytosis, anemia.  [CS]    Clinical Course User Index [CS] Pollyann Savoy, MD                             Medical Decision Making Amount and/or Complexity of Data Reviewed Labs: ordered. Radiology: ordered.  Risk Prescription drug management.  This patient presents to the ED with chief complaint(s) of pleuritic chest pain/back pain, shortness of breath, productive cough with pertinent past medical history of pneumonia, recent abdominal surgery.The complaint involves an extensive  differential diagnosis and also carries with it a high risk of complications and morbidity.    The differential diagnosis includes pneumonia, asthma exacerbation, new onset CHF, pulmonary embolism, pericarditis.  The initial plan is to get basic labs including D-dimer.  I have independently interpreted patient's chest x-ray, there is no focal consolidation, pleural effusion.   Additional history obtained:  Records reviewed Care Everywhere/External Records  Independent labs interpretation:  The following labs were independently interpreted: CBC shows no profound leukocytosis  Independent visualization and interpretation of imaging: - I independently visualized the following imaging with scope of interpretation limited to determining acute life threatening conditions related to emergency care: X-ray of the chest, which revealed no  focal consolidation  Treatment and Reassessment: Patient's care has been passed to incoming team. If dimer is elevated then patient will need CT PE. If dimer is negative then we will discharge her with antibiotics and burst steroids.  Final Clinical Impression(s) / ED Diagnoses Final diagnoses:  Pleurisy    Rx / DC Orders ED Discharge Orders     None         Derwood Kaplan, MD 10/20/22 2326

## 2022-10-21 ENCOUNTER — Emergency Department (HOSPITAL_BASED_OUTPATIENT_CLINIC_OR_DEPARTMENT_OTHER): Payer: 59

## 2022-10-21 MED ORDER — PREDNISONE 10 MG (21) PO TBPK: ORAL_TABLET | ORAL | 0 refills | Status: DC

## 2022-10-21 MED ORDER — AMOXICILLIN-POT CLAVULANATE 875-125 MG PO TABS: 1.0000 | ORAL_TABLET | Freq: Two times a day (BID) | ORAL | 0 refills | Status: AC

## 2022-10-21 MED ORDER — HYDROCODONE-ACETAMINOPHEN 5-325 MG PO TABS
1.0000 | ORAL_TABLET | Freq: Once | ORAL | Status: AC
  Administered 2022-10-21: 1 via ORAL
  Filled 2022-10-21: qty 1

## 2022-10-21 MED ORDER — IOHEXOL 350 MG/ML SOLN
100.0000 mL | Freq: Once | INTRAVENOUS | Status: AC | PRN
  Administered 2022-10-21: 75 mL via INTRAVENOUS

## 2022-10-21 MED ORDER — PREDNISONE 50 MG PO TABS
60.0000 mg | ORAL_TABLET | Freq: Once | ORAL | Status: AC
  Administered 2022-10-21: 60 mg via ORAL
  Filled 2022-10-21: qty 1

## 2022-10-21 MED ORDER — AMOXICILLIN-POT CLAVULANATE 875-125 MG PO TABS
1.0000 | ORAL_TABLET | Freq: Once | ORAL | Status: AC
  Administered 2022-10-21: 1 via ORAL
  Filled 2022-10-21: qty 1

## 2022-10-21 MED ORDER — AMOXICILLIN-POT CLAVULANATE 875-125 MG PO TABS: 1.0000 | ORAL_TABLET | Freq: Two times a day (BID) | ORAL | 0 refills | Status: DC

## 2022-11-09 ENCOUNTER — Ambulatory Visit: Payer: BC Managed Care – PPO | Admitting: Internal Medicine

## 2022-12-15 NOTE — Progress Notes (Signed)
HPI- [name] of Dr Celine Mans, followed for hx CAP with complex parapneumonic effusion, fibrinolysis/ chest tube, Asthma Medical problem list includes Hemorrhagic Gastritis, Cholangitis  ========================================================  07/08/22- [name] of Dr Celine Mans, followed for hx CAP with complex parapneumonic effusion, fibrinolysis/ chest tube, Asthmatic Bronchitis Medical problem list includes Hemorrhagic Gastritis, Cholangitis -Ventolin hfa, Tessalon, Advair 250, Allegra,  Body weight today-255 lbs Covid vax- 2 Phizer Flu -had Eval by [facility] for intrahepatic bile duct disease/ cholangitis. Since, fibrinolysis/ chest tube, Asthmatic Bronchitis Medical problem list includes  Cholangitis/cholecystectomy, Hemorrhagic Gastritis, -Ventolin hfa, Tessalon, Advair 250, Allegra, Breztri/ Advair 250,  Body weight today- ED 6/19 for pleuritic R shoulder  Covid infection in December, she still has productive cough/ green lying down. PCP gave Zpak in Feb "no help". Denies fever, nodes, blood.   12/17/22- [name] followed for Asthmatic Bronchitis, Aspirin Allergy,  hx CAP with complex parapneumonic effusion, complicated by Cholangitis/cholecystectomy, Hemorrhagic Gastritis, -Ventolin hfa, Tessalon, Wixela 250, Allegra,  Had cholecystectomy at [facility] in April. Rx prednisone -----Pt is recently getting over Pneumonia-was given an antibiotic. She states she is somewhat better but is still coughing up green phlegm. Finished augmentin from ED visit in June. As she gets home from work in evening she begins to cough- productive thick green. Denies fever/ sweat.  Home is not moldy. Has aircleaner. Daughter has cats in her room, which patient never sees. Hardwood floors except in bedroom. Admits maxillary sinus area tenderness and morning headaches. Hx aspirin "makes my asthma worse", but never told of nasal polyps. Allergy tested in past and it sounds as if Biologic considered, but not  done. CTachest PE 10/2022- IMPRESSION: 1. No evidence of pulmonary embolism. 2. Bronchial wall thickening with patchy atelectasis or infiltrate at the lung bases and consolidation in the lingular segment of the left upper lobe.  ROS-see HPI   + = positive Constitutional:    weight loss, night sweats, fevers, chills, fatigue, lassitude. HEENT:    headaches, difficulty swallowing, tooth/dental problems, sore throat,       sneezing, itching, ear ache, nasal congestion, post nasal drip, snoring CV:    +chest pain, orthopnea, PND, swelling in lower extremities, anasarca,                                   dizziness, palpitations Resp:   shortness of breath with exertion or at rest.                +productive cough,   non-productive cough, coughing up of blood.              +change in color of mucus.  wheezing.   Skin:    rash or lesions. GI:  No-   heartburn, indigestion, abdominal pain, nausea, vomiting, diarrhea,                 change in bowel habits, loss of appetite GU: dysuria, change in color of urine, no urgency or frequency.   flank pain. MS:   joint pain, stiffness, decreased range of motion, back pain. Neuro-     nothing unusual Psych:  change in mood or affect.  depression or anxiety.   memory loss.  OBJ- Physical Exam General- Alert, Oriented, Affect-appropriate, Distress- none evident, +obese Skin- rash-none, lesions- none, excoriation- none Lymphadenopathy- none Head- atraumatic            Eyes- Gross vision intact, PERRLA, conjunctivae  and secretions clear            Ears- Hearing, canals-normal            Nose- Clear, no-Septal dev, mucus, polyps not visible, erosion, perforation             Throat- Mallampati II , mucosa clear , drainage- none, tonsils- atrophic Neck- flexible , trachea midline, no stridor , thyroid nl, carotid no bruit Chest - symmetrical excursion , unlabored           Heart/CV- RRR , no murmur , no gallop  , no rub, nl s1 s2                            - JVD- none , edema- none, stasis changes- none, varices- none           Lung-  + coarse in bases, wheeze-none, cough- none , dullness-none, rub- none           Chest wall-  Abd-  Br/ Gen/ Rectal- Not done, not indicated Extrem- cyanosis- none, clubbing, none, atrophy- none, strength- nl Neuro- grossly intact to observation

## 2022-12-17 ENCOUNTER — Encounter: Payer: Self-pay | Admitting: Internal Medicine

## 2022-12-17 ENCOUNTER — Ambulatory Visit: Payer: 59 | Admitting: Internal Medicine

## 2022-12-17 VITALS — BP 124/76 | HR 73 | Ht 66.0 in | Wt 222.4 lb

## 2022-12-17 DIAGNOSIS — J45909 Unspecified asthma, uncomplicated: Secondary | ICD-10-CM

## 2022-12-17 DIAGNOSIS — J329 Chronic sinusitis, unspecified: Secondary | ICD-10-CM

## 2022-12-17 DIAGNOSIS — J320 Chronic maxillary sinusitis: Secondary | ICD-10-CM | POA: Diagnosis not present

## 2022-12-17 DIAGNOSIS — J4541 Moderate persistent asthma with (acute) exacerbation: Secondary | ICD-10-CM | POA: Diagnosis not present

## 2022-12-17 MED ORDER — DOXYCYCLINE HYCLATE 100 MG PO TABS: 100.0000 mg | ORAL_TABLET | Freq: Two times a day (BID) | ORAL | 0 refills | Status: DC

## 2022-12-17 NOTE — Patient Instructions (Signed)
Order- lab-  (next week is fine)   Ige, CBC w diff     dx Allergic Asthma  Order- limited CT max fac       chronic sinusitis  Script sent for doxycycline antibiotic

## 2022-12-17 NOTE — Addendum Note (Signed)
Addended by: Erick Blinks A on: 12/17/2022 10:29 AM   Modules accepted: Orders

## 2022-12-17 NOTE — Assessment & Plan Note (Signed)
Plan- CT of sinuses. Question polyps, though not visible anteriorly.

## 2022-12-17 NOTE — Assessment & Plan Note (Signed)
Lingering cough with green sputum, suggests bronchiectasis. Will try doxycycline, but if ineffective will need to culture.  Hx of allergy to aspirin. Consider a Biologic, maybe Dupixent.

## 2022-12-20 ENCOUNTER — Ambulatory Visit (HOSPITAL_COMMUNITY)
Admission: RE | Admit: 2022-12-20 | Discharge: 2022-12-20 | Disposition: A | Discharge status: Home / Self Care | Payer: 59 | Source: Ambulatory Visit | Attending: Internal Medicine | Admitting: Internal Medicine

## 2022-12-20 DIAGNOSIS — J329 Chronic sinusitis, unspecified: Secondary | ICD-10-CM | POA: Diagnosis present

## 2022-12-20 LAB — CBC WITH DIFFERENTIAL/PLATELET
Basophils Absolute: 0.1 10*3/uL (ref 0.0–0.1)
Basophils Relative: 0.9 % (ref 0.0–3.0)
Eosinophils Absolute: 0.4 10*3/uL (ref 0.0–0.7)
Eosinophils Relative: 3 % (ref 0.0–5.0)
HCT: 38 % (ref 36.0–46.0)
Hemoglobin: 12.2 g/dL (ref 12.0–15.0)
Lymphocytes Relative: 12.7 % (ref 12.0–46.0)
Lymphs Abs: 1.5 10*3/uL (ref 0.7–4.0)
MCHC: 32 g/dL (ref 30.0–36.0)
MCV: 91.1 fl (ref 78.0–100.0)
Monocytes Absolute: 0.6 10*3/uL (ref 0.1–1.0)
Monocytes Relative: 5.2 % (ref 3.0–12.0)
Neutro Abs: 9.4 10*3/uL — ABNORMAL HIGH (ref 1.4–7.7)
Neutrophils Relative %: 78.2 % — ABNORMAL HIGH (ref 43.0–77.0)
Platelets: 327 10*3/uL (ref 150.0–400.0)
RBC: 4.17 Mil/uL (ref 3.87–5.11)
RDW: 15.2 % (ref 11.5–15.5)
WBC: 12 10*3/uL — ABNORMAL HIGH (ref 4.0–10.5)

## 2022-12-22 LAB — IGE: IgE (Immunoglobulin E), Serum: 97 kU/L (ref <=114)

## 2022-12-30 ENCOUNTER — Other Ambulatory Visit: Payer: Self-pay

## 2022-12-30 MED ORDER — BENZONATATE 100 MG PO CAPS: 100.0000 mg | ORAL_CAPSULE | Freq: Three times a day (TID) | ORAL | 0 refills | Status: DC | PRN

## 2022-12-30 MED ORDER — AMOXICILLIN-POT CLAVULANATE 875-125 MG PO TABS: 1.0000 | ORAL_TABLET | Freq: Two times a day (BID) | ORAL | 0 refills | Status: DC

## 2023-01-06 ENCOUNTER — Telehealth: Payer: Self-pay

## 2023-01-06 NOTE — Telephone Encounter (Signed)
Patient states she is still having cough with green mucus production.  Patient denies fever.  Patient only has about 2 more days of Augmentin 875 and is using benzonatate.  Patient would like to know if there is anything else she can take to help with the productive cough with green mucus.  Patient also using albuterol prn, Advair 250/50 1 puff BID, and Allegra D prn.  Dr. Maple Hudson, please advise.  Allergies  Allergen Reactions   Aspirin Shortness Of Breath    Pt tolerates aspirin for occasional use.

## 2023-01-06 NOTE — Telephone Encounter (Signed)
Patient was contacted and called into clinic on 12/30/2022.  Rx for Augmentin 875 mg and benzonatate was sent into pharmacy.

## 2023-01-06 NOTE — Telephone Encounter (Signed)
We need to try to get her in to be seen. She needs to havee sputum cultures and examination. Ok to use a held spot with me if I have one

## 2023-01-07 NOTE — Telephone Encounter (Signed)
Sent message to front desk to have patient called and scheduled for first available with Dr. Maple Hudson.

## 2023-01-08 NOTE — Progress Notes (Signed)
HPI- F Monique Harrison of [name], followed for hx CAP with complex parapneumonic effusion, fibrinolysis/ chest tube, Asthma Medical problem list includes Hemorrhagic Gastritis, Cholangitis  ========================================================   12/17/22- 47 yoF Monique Harrison followed for Asthmatic Bronchitis, Aspirin Allergy,  hx CAP with complex parapneumonic effusion, complicated by Cholangitis/cholecystectomy, Hemorrhagic Gastritis, -Ventolin hfa, Tessalon, Wixela 250, Allegra,  Had cholecystectomy at [facility] in April. Rx prednisone -----Pt is recently getting over Pneumonia-was given an antibiotic. She states she is somewhat better but is still coughing up green phlegm. Finished augmentin from ED visit in June. As she gets home from work in evening she begins to cough- productive thick green. Denies fever/ sweat.  Home is not moldy. Has aircleaner. Daughter has cats in her room, which Monique Harrison never sees. Hardwood floors except in bedroom. Admits maxillary sinus area tenderness and morning headaches. Hx aspirin "makes my asthma worse", but never told of nasal polyps. Allergy tested in past and it sounds as if Biologic considered, but not done. CTachest PE 10/2022- IMPRESSION: 1. No evidence of pulmonary embolism. 2. Bronchial wall thickening with patchy atelectasis or infiltrate at the lung bases and consolidation in the lingular segment of the left upper lobe.  01/11/23- 47 yoF Monique Harrison followed for Asthmatic Bronchitis, Aspirin Allergy,  hx CAP with complex parapneumonic effusion, complicated by Cholangitis/cholecystectomy, Hemorrhagic Gastritis, -Ventolin hfa, Tessalon, Wixela 250, Allegra,  Has called complaining of persistent cough green. Sent doxycycline 8/16, Consider Dupixent   [name] Monique Harrison     -----Cough with green, thick mucus persistent since June 2024. No help from doxycycline then augmentin. Much sinus pressure pain- maxillary/retro-orbital. Denies reflux/ nocturnal choking. Lab-  IgE97, EOS wnl, WBC 12,000/ 78% PMN CT maxfac 12/20/22- IMPRESSION: Layering fluid in the right maxillary and left sphenoid sinuses with scattered mucosal thickening elsewhere as above. Findings could reflect acute sinusitis in the correct clinical setting.  ROS-see HPI   + = positive Constitutional:    weight loss, night sweats, fevers, chills, fatigue, lassitude. HEENT:    headaches, difficulty swallowing, tooth/dental problems, sore throat,       sneezing, itching, ear ache, nasal congestion, post nasal drip, snoring CV:    +chest pain, orthopnea, PND, swelling in lower extremities, anasarca,                                   dizziness, palpitations Resp:   shortness of breath with exertion or at rest.                +productive cough,   non-productive cough, coughing up of blood.              +change in color of mucus.  wheezing.   Skin:    rash or lesions. GI:  No-   heartburn, indigestion, abdominal pain, nausea, vomiting, diarrhea,                 change in bowel habits, loss of appetite GU: dysuria, change in color of urine, no urgency or frequency.   flank pain. MS:   joint pain, stiffness, decreased range of motion, back pain. Neuro-     nothing unusual Psych:  change in mood or affect.  depression or anxiety.   memory loss.  OBJ- Physical Exam General- Alert, Oriented, Affect-appropriate, Distress- none evident, +obese Skin- rash-none, lesions- none, excoriation- none Lymphadenopathy- none Head- atraumatic, +mild periorbital edema            Eyes-  Gross vision intact, PERRLA, conjunctivae and secretions clear            Ears- Hearing, canals-normal            Nose- Clear, no-Septal  dev, mucus, polyps not visible, erosion, perforation             Throat- Mallampati II , mucosa clear , drainage- none, tonsils- atrophic Neck- flexible , trachea midline, no stridor , thyroid nl, carotid no bruit Chest - symmetrical excursion , unlabored           Heart/CV- RRR , no murmur ,  no gallop  , no rub, nl s1 s2                           - JVD- none , edema- none, stasis changes- none, varices- none           Lung-  + clear, wheeze-none, cough- none , dullness-none, rub- none           Chest wall-  Abd-  Br/ Gen/ Rectal- Not done, not indicated Extrem- cyanosis- none, clubbing, none, atrophy- none, strength- nl Neuro- grossly intact to observation

## 2023-01-11 ENCOUNTER — Encounter: Payer: Self-pay | Admitting: Internal Medicine

## 2023-01-11 ENCOUNTER — Ambulatory Visit: Payer: 59 | Admitting: Internal Medicine

## 2023-01-11 VITALS — BP 96/60 | HR 82 | Temp 98.5°F | Ht 66.0 in | Wt 224.4 lb

## 2023-01-11 DIAGNOSIS — J320 Chronic maxillary sinusitis: Secondary | ICD-10-CM | POA: Diagnosis not present

## 2023-01-11 DIAGNOSIS — J189 Pneumonia, unspecified organism: Secondary | ICD-10-CM

## 2023-01-11 MED ORDER — LEVOFLOXACIN 750 MG PO TABS
750.0000 mg | ORAL_TABLET | Freq: Every day | ORAL | 0 refills | Status: DC
Start: 2023-01-11 — End: 2023-06-24

## 2023-01-11 NOTE — Patient Instructions (Addendum)
You have a sinus infection. If we can clear this, then your cough should improve  Script sent for levaquin x 10 days      Use your saline nasal rinse once or twice a day to help clear the mucus.  Order- Sputum culture   dx acute maxillary sinusitis

## 2023-01-12 ENCOUNTER — Other Ambulatory Visit: Payer: 59

## 2023-01-12 DIAGNOSIS — J320 Chronic maxillary sinusitis: Secondary | ICD-10-CM

## 2023-01-14 ENCOUNTER — Telehealth: Payer: Self-pay | Admitting: Internal Medicine

## 2023-01-14 NOTE — Telephone Encounter (Signed)
Atc pt no answer, mailbox full

## 2023-01-14 NOTE — Telephone Encounter (Signed)
Patient has questions about side effects of Levaquin. Patient phone number is [phone].

## 2023-01-16 ENCOUNTER — Encounter: Payer: Self-pay | Admitting: Internal Medicine

## 2023-01-16 LAB — RESPIRATORY CULTURE OR RESPIRATORY AND SPUTUM CULTURE
MICRO NUMBER:: 15451800
SPECIMEN QUALITY:: ADEQUATE

## 2023-01-16 NOTE — Assessment & Plan Note (Signed)
Clinically this has resolved. Watch for relapse.

## 2023-01-16 NOTE — Assessment & Plan Note (Signed)
Acute on chronic maxillary sinusitis Plan- sputum culture, then start levaquin 750 x 10 days. Saline nasal lavage. If incomplete response then ENT referral.

## 2023-01-17 NOTE — Telephone Encounter (Signed)
I called and spoke with the pt  She states that she could not tolerate levaquin due to HA  She read that she should not have been taking this her her antidepressants  She is asking for a different abx  Please advise thanks!  Allergies  Allergen Reactions   Aspirin Shortness Of Breath    Pt tolerates aspirin for occasional use.   Ibuprofen Itching and Swelling    Tongue itches and swells    Can tolerate ibuprofen   Naproxen Sodium Other (See Comments)    Cause bleeding

## 2023-01-17 NOTE — Telephone Encounter (Signed)
Offer omnicef 300 mg, # 20, 1 twice daily

## 2023-01-18 MED ORDER — CEFDINIR 300 MG PO CAPS: 300.0000 mg | ORAL_CAPSULE | Freq: Two times a day (BID) | ORAL | 0 refills | Status: DC

## 2023-01-18 NOTE — Telephone Encounter (Signed)
I called and spoke with the pt and notified of response per CY  She verbalized understanding  Rx was sent  Nothing further needed

## 2023-02-02 ENCOUNTER — Telehealth: Payer: Self-pay | Admitting: Internal Medicine

## 2023-02-02 NOTE — Telephone Encounter (Signed)
Pt was advised on Dr.Young recommendations. She states she already seen ENT pt verbalized understanding. Nfn

## 2023-02-02 NOTE — Telephone Encounter (Signed)
Ct had demonstrated acute sinusitis. She didn't respond to multiple antibiotics. Please refer to [facility] ENT

## 2023-02-02 NOTE — Telephone Encounter (Signed)
Patient states having symptoms of cough and mucus. Not able to come tomorrow for visit. Pharmacy is [facility] [address]. Patient phone number is [phone].

## 2023-02-10 ENCOUNTER — Other Ambulatory Visit (HOSPITAL_BASED_OUTPATIENT_CLINIC_OR_DEPARTMENT_OTHER): Payer: Self-pay

## 2023-02-10 MED ORDER — FLUTICASONE-SALMETEROL 250-50 MCG/ACT IN AEPB: 1.0000 | INHALATION_SPRAY | Freq: Two times a day (BID) | RESPIRATORY_TRACT | 5 refills | Status: DC

## 2023-03-21 ENCOUNTER — Ambulatory Visit: Payer: 59 | Admitting: Internal Medicine

## 2023-04-23 ENCOUNTER — Emergency Department (HOSPITAL_BASED_OUTPATIENT_CLINIC_OR_DEPARTMENT_OTHER)
Admission: EM | Admit: 2023-04-23 | Discharge: 2023-04-23 | Disposition: A | Discharge status: Home / Self Care | Payer: 59 | Attending: Emergency Medicine | Admitting: Emergency Medicine

## 2023-04-23 ENCOUNTER — Encounter (HOSPITAL_BASED_OUTPATIENT_CLINIC_OR_DEPARTMENT_OTHER): Payer: Self-pay

## 2023-04-23 ENCOUNTER — Emergency Department (HOSPITAL_BASED_OUTPATIENT_CLINIC_OR_DEPARTMENT_OTHER): Payer: 59 | Admitting: Radiology

## 2023-04-23 DIAGNOSIS — J40 Bronchitis, not specified as acute or chronic: Secondary | ICD-10-CM | POA: Insufficient documentation

## 2023-04-23 DIAGNOSIS — J45909 Unspecified asthma, uncomplicated: Secondary | ICD-10-CM | POA: Insufficient documentation

## 2023-04-23 DIAGNOSIS — Z1152 Encounter for screening for COVID-19: Secondary | ICD-10-CM | POA: Diagnosis not present

## 2023-04-23 DIAGNOSIS — J45901 Unspecified asthma with (acute) exacerbation: Secondary | ICD-10-CM

## 2023-04-23 DIAGNOSIS — R059 Cough, unspecified: Secondary | ICD-10-CM | POA: Diagnosis present

## 2023-04-23 DIAGNOSIS — I10 Essential (primary) hypertension: Secondary | ICD-10-CM | POA: Insufficient documentation

## 2023-04-23 LAB — RESP PANEL BY RT-PCR (RSV, FLU A&B, COVID)  RVPGX2
Influenza A by PCR: NEGATIVE
Influenza B by PCR: NEGATIVE
Resp Syncytial Virus by PCR: NEGATIVE
SARS Coronavirus 2 by RT PCR: NEGATIVE

## 2023-04-23 MED ORDER — ALBUTEROL SULFATE HFA 108 (90 BASE) MCG/ACT IN AERS: 1.0000 | INHALATION_SPRAY | Freq: Four times a day (QID) | RESPIRATORY_TRACT | 0 refills | Status: AC | PRN

## 2023-04-23 MED ORDER — BENZONATATE 100 MG PO CAPS: 100.0000 mg | ORAL_CAPSULE | Freq: Three times a day (TID) | ORAL | 0 refills | Status: AC

## 2023-04-23 MED ORDER — PREDNISONE 10 MG PO TABS: 40.0000 mg | ORAL_TABLET | Freq: Every day | ORAL | 0 refills | Status: AC

## 2023-04-23 NOTE — ED Notes (Signed)
Pt transported to Xray. 

## 2023-04-23 NOTE — ED Triage Notes (Signed)
Patient presents to the ED c/o generalized body aches +lower back pain, dyspnea on exertion that started one week ago. Denies chest pain, sick contacts, fever, chills.

## 2023-04-23 NOTE — ED Provider Notes (Signed)
 [place] EMERGENCY DEPARTMENT AT [facility] Provider Note   CSN: [id number] Arrival date & time: 04/23/23  1055     History Chief Complaint  Patient presents with   Generalized Body Aches    Monique Harrison is a 47 y.o. female.  Patient past history significant for asthma, pleurisy, and hypertension here with concerns of generalized bodyaches.  She reports that she been having generalized bodyaches and some upper thoracic discomfort with coughing.  States this been ongoing for the last week or so.  Denies any fever, chills, sick contacts, or shortness of breath.  Has been try to manage with over-the-counter medications and albuterol inhaler without notable improvement.  HPI     Home Medications Prior to Admission medications   Medication Sig Start Date End Date Taking? Authorizing Provider  albuterol (VENTOLIN HFA) 108 (90 Base) MCG/ACT inhaler Inhale 1-2 puffs into the lungs every 6 (six) hours as needed for wheezing or shortness of breath. 04/23/23  Yes Smitty Knudsen, PA-C  benzonatate (TESSALON) 100 MG capsule Take 1 capsule (100 mg total) by mouth every 8 (eight) hours. 04/23/23  Yes Smitty Knudsen, PA-C  predniSONE (DELTASONE) 10 MG tablet Take 4 tablets (40 mg total) by mouth daily for 5 days. 04/23/23 04/28/23 Yes Smitty Knudsen, PA-C  albuterol (VENTOLIN HFA) 108 (90 Base) MCG/ACT inhaler Inhale 2 puffs into the lungs every 4 (four) hours as needed. 04/08/22   Jetty Duhamel D, MD  Budeson-Glycopyrrol-Formoterol (BREZTRI AEROSPHERE) 160-9-4.8 MCG/ACT AERO Inhale 2 puffs into the lungs in the morning and at bedtime. Patient not taking: Reported on 01/11/2023 07/08/22   Waymon Budge, MD  busPIRone (BUSPAR) 5 MG tablet Take 5 mg by mouth 3 (three) times daily.    [provider]  cefdinir (OMNICEF) 300 MG capsule Take 1 capsule (300 mg total) by mouth 2 (two) times daily. 01/18/23   Jetty Duhamel D, MD  Cholecalciferol (VITAMIN D3) 125 MCG (5000 UT) CAPS  Take 5,000 Units by mouth daily.    [provider]  fexofenadine-pseudoephedrine (ALLEGRA-D 24) 180-240 MG 24 hr tablet Take 1 tablet by mouth daily as needed (allergies, sinus pressure).    [provider]  fluticasone-salmeterol (WIXELA INHUB) 250-50 MCG/ACT AEPB Inhale 1 puff into the lungs in the morning and at bedtime. 02/10/23   Oretha Milch, MD  levofloxacin (LEVAQUIN) 750 MG tablet Take 1 tablet (750 mg total) by mouth daily. 01/11/23   Jetty Duhamel D, MD  Magnesium 250 MG TABS Take 1 tablet by mouth daily.    [provider]  sertraline (ZOLOFT) 50 MG tablet Take 100 mg by mouth daily. 12/30/21   [provider]  ursodiol (ACTIGALL) 300 MG capsule Take 300 mg by mouth 2 (two) times daily. 11/25/20   [provider]      Allergies    Aspirin, Ibuprofen, and Naproxen sodium    Review of Systems   Review of Systems  Respiratory:  Positive for cough.   All other systems reviewed and are negative.   Physical Exam Updated Vital Signs BP 117/84 (BP Location: Right Arm)   Pulse 85   Temp 99.1 F (37.3 C)   Resp 16   SpO2 96%  Physical Exam Vitals and nursing note reviewed.  Constitutional:      General: She is not in acute distress.    Appearance: She is well-developed.  HENT:     Head: Normocephalic and atraumatic.  Eyes:     Conjunctiva/sclera: Conjunctivae normal.  Cardiovascular:     Rate and Rhythm: Normal rate and regular rhythm.     Heart sounds: No murmur heard. Pulmonary:     Effort: Pulmonary effort is normal. No respiratory distress.     Breath sounds: Normal breath sounds. No wheezing or rales.  Abdominal:     Palpations: Abdomen is soft.     Tenderness: There is no abdominal tenderness.  Musculoskeletal:        General: No swelling or tenderness. Normal range of motion.     Cervical back: Neck supple.  Skin:    General: Skin is warm and dry.     Capillary Refill: Capillary refill takes less than 2 seconds.   Neurological:     Mental Status: She is alert.  Psychiatric:        Mood and Affect: Mood normal.     ED Results / Procedures / Treatments   Labs (all labs ordered are listed, but only abnormal results are displayed) Labs Reviewed  RESP PANEL BY RT-PCR (RSV, FLU A&B, COVID)  [id number]    EKG None  Radiology DG Chest 2 View Result Date: 04/23/2023 CLINICAL DATA:  Cough, chest tightness for 1 week. EXAM: CHEST - 2 VIEW COMPARISON:  CT chest dated 10/21/2022. FINDINGS: The heart size and mediastinal contours are within normal limits. There is mild bibasilar atelectasis. No pleural effusion or pneumothorax. The visualized skeletal structures are unremarkable. IMPRESSION: Mild bibasilar atelectasis. Electronically Signed   By: Romona Curls M.D.   On: 04/23/2023 12:15    Procedures Procedures   Medications Ordered in ED Medications - No data to display  ED Course/ Medical Decision Making/ A&P                               Medical Decision Making Amount and/or Complexity of Data Reviewed Radiology: ordered.   This patient presents to the ED for concern of generalized bodyaches.  Differential diagnosis includes viral URI, COVID-19, RSV, influenza, pneumonia, bronchitis   Lab Tests:  I Ordered, and personally interpreted labs.  The pertinent results include: Negative for COVID-19, influenza A and B, RSV   Imaging Studies ordered:  I ordered imaging studies including chest x-ray I independently visualized and interpreted imaging which showed mild bibasilar atelectasis I agree with the radiologist interpretation   Problem List / ED Course:  Patient presents the emergency department past history significant for pleurisy, asthma, hypertension with concerns of generalized bodyaches.  She reports has been ongoing for the last week or so with some upper thoracic discomfort when coughing.  She states that she has not had any fever, chills, nausea, vomiting or diarrhea.  Does  report that her coughing has been somewhat productive.  Denies any sick contact as far as patient is aware.  Is concern for possible pneumonia as she previously had been diagnosed pneumonia several months ago with similar type symptoms. Patient is negative for COVID-19, influenza, RSV.  Chest x-ray also unremarkable. Advised management of symptoms with over-the-counter medications for cough, congestion, fever, chills or bodyaches.  No acute indication to initiate antibiotic therapy.  In the setting of asthma with some productive coughing, will initiate a short burst of prednisone for symptomatic relief.  Advised patient return to the emergency department if she has any acute worsening in symptoms.  Otherwise encourage close PCP follow-up. Patient remained stable and no other acute indication for further workup at this time. Discharged home in stable condition.  Final Clinical Impression(s) / ED Diagnoses Final diagnoses:  Bronchitis  Mild asthma exacerbation    Rx / DC Orders ED Discharge Orders          Ordered    benzonatate (TESSALON) 100 MG capsule  Every 8 hours        04/23/23 1245    predniSONE (DELTASONE) 10 MG tablet  Daily        04/23/23 1245    albuterol (VENTOLIN HFA) 108 (90 Base) MCG/ACT inhaler  Every 6 hours PRN        04/23/23 1247              Smitty Knudsen, PA-C 04/23/23 1247    Gwyneth Sprout, MD 04/24/23 [phone]

## 2023-04-23 NOTE — Discharge Instructions (Signed)
You were seen in the ER today for concerns of generalized body aches. You were negative for influenza, COVID-19, and RSV. Your chest xray was also negative for signs of pneumonia. I suspect your symptoms are due to a mild asthma exacerbation and bronchitis. Medications have been sent to your pharmacy to try to manage these symptoms. Please follow up with your primary care provider.

## 2023-06-11 ENCOUNTER — Emergency Department (HOSPITAL_BASED_OUTPATIENT_CLINIC_OR_DEPARTMENT_OTHER): Payer: 59

## 2023-06-11 ENCOUNTER — Encounter (HOSPITAL_BASED_OUTPATIENT_CLINIC_OR_DEPARTMENT_OTHER): Payer: Self-pay | Admitting: Emergency Medicine

## 2023-06-11 ENCOUNTER — Emergency Department (HOSPITAL_BASED_OUTPATIENT_CLINIC_OR_DEPARTMENT_OTHER)
Admission: EM | Admit: 2023-06-11 | Discharge: 2023-06-11 | Disposition: A | Discharge status: Home / Self Care | Payer: 59 | Attending: Emergency Medicine | Admitting: Emergency Medicine

## 2023-06-11 DIAGNOSIS — R0789 Other chest pain: Secondary | ICD-10-CM | POA: Diagnosis not present

## 2023-06-11 DIAGNOSIS — J45909 Unspecified asthma, uncomplicated: Secondary | ICD-10-CM | POA: Insufficient documentation

## 2023-06-11 DIAGNOSIS — R0602 Shortness of breath: Secondary | ICD-10-CM | POA: Insufficient documentation

## 2023-06-11 DIAGNOSIS — Z20822 Contact with and (suspected) exposure to covid-19: Secondary | ICD-10-CM | POA: Diagnosis not present

## 2023-06-11 DIAGNOSIS — R079 Chest pain, unspecified: Secondary | ICD-10-CM | POA: Diagnosis present

## 2023-06-11 LAB — CBC
HCT: 37.3 % (ref 36.0–46.0)
Hemoglobin: 11.9 g/dL — ABNORMAL LOW (ref 12.0–15.0)
MCH: 29.8 pg (ref 26.0–34.0)
MCHC: 31.9 g/dL (ref 30.0–36.0)
MCV: 93.5 fL (ref 80.0–100.0)
Platelets: 246 10*3/uL (ref 150–400)
RBC: 3.99 MIL/uL (ref 3.87–5.11)
RDW: 13.2 % (ref 11.5–15.5)
WBC: 8.3 10*3/uL (ref 4.0–10.5)
nRBC: 0 % (ref 0.0–0.2)

## 2023-06-11 LAB — RESP PANEL BY RT-PCR (RSV, FLU A&B, COVID)  RVPGX2
Influenza A by PCR: NEGATIVE
Influenza B by PCR: NEGATIVE
Resp Syncytial Virus by PCR: NEGATIVE
SARS Coronavirus 2 by RT PCR: NEGATIVE

## 2023-06-11 LAB — BASIC METABOLIC PANEL
Anion gap: 7 (ref 5–15)
BUN: 15 mg/dL (ref 6–20)
CO2: 29 mmol/L (ref 22–32)
Calcium: 8.9 mg/dL (ref 8.9–10.3)
Chloride: 101 mmol/L (ref 98–111)
Creatinine, Ser: 0.65 mg/dL (ref 0.44–1.00)
GFR, Estimated: >60 mL/min (ref >60)
Glucose, Bld: 105 mg/dL — ABNORMAL HIGH (ref 70–99)
Potassium: 4.4 mmol/L (ref 3.5–5.1)
Sodium: 137 mmol/L (ref 135–145)

## 2023-06-11 LAB — TROPONIN I (HIGH SENSITIVITY): Troponin I (High Sensitivity): 2 ng/L (ref <18)

## 2023-06-11 LAB — D-DIMER, QUANTITATIVE: D-Dimer, Quant: 0.43 ug{FEU}/mL (ref 0.00–0.50)

## 2023-06-11 MED ORDER — MORPHINE SULFATE (PF) 4 MG/ML IV SOLN
4.0000 mg | Freq: Once | INTRAVENOUS | Status: AC
  Administered 2023-06-11: 4 mg via INTRAVENOUS
  Filled 2023-06-11: qty 1

## 2023-06-11 NOTE — ED Triage Notes (Signed)
 Pt endorses hx of asthma, c/o LT side CP, LT arm pain and LT side posterior shoulder pain x 2 weeks. Endorses shob and cough.

## 2023-06-11 NOTE — ED Provider Notes (Signed)
 [place] EMERGENCY DEPARTMENT AT [facility] Provider Note   CSN: [id number] Arrival date & time: 06/11/23  1750     History  Chief Complaint  Patient presents with   Chest Pain    Monique Harrison is a 48 y.o. female.  With history of asthma, GERD presenting to the ED for evaluation of chest pain.  Localized to the left upper chest radiating down to the left arm.  Symptoms began approximately 2 weeks ago.  Pain is worse with movement of the left arm.  She reports shortness of breath and cough as well.  She is coughing green sputum.  States she fell onto her left shoulder while walking her dog just prior to symptom onset.  No nausea, vomiting, diaphoresis.  She denies hemoptysis, recent cancer treatments, surgeries, unilateral leg swelling, history of DVT or PE.  She does have a hormone containing IUD in place.   Chest Pain Associated symptoms: cough and shortness of breath        Home Medications Prior to Admission medications   Medication Sig Start Date End Date Taking? Authorizing Provider  albuterol  (VENTOLIN  HFA) 108 (90 Base) MCG/ACT inhaler Inhale 2 puffs into the lungs every 4 (four) hours as needed. 04/08/22   Neysa Reggy BIRCH, MD  albuterol  (VENTOLIN  HFA) 108 (90 Base) MCG/ACT inhaler Inhale 1-2 puffs into the lungs every 6 (six) hours as needed for wheezing or shortness of breath. 04/23/23   Zelaya, Oscar A, PA-C  benzonatate  (TESSALON ) 100 MG capsule Take 1 capsule (100 mg total) by mouth every 8 (eight) hours. 04/23/23   Zelaya, Oscar A, PA-C  Budeson-Glycopyrrol-Formoterol  (BREZTRI  AEROSPHERE) 160-9-4.8 MCG/ACT AERO Inhale 2 puffs into the lungs in the morning and at bedtime. Patient not taking: Reported on 01/11/2023 07/08/22   Neysa Reggy BIRCH, MD  busPIRone (BUSPAR) 5 MG tablet Take 5 mg by mouth 3 (three) times daily.    [provider]  cefdinir  (OMNICEF ) 300 MG capsule Take 1 capsule (300 mg total) by mouth 2 (two) times daily. 01/18/23   Neysa Reggy D, MD  Cholecalciferol (VITAMIN D3) 125 MCG (5000 UT) CAPS Take 5,000 Units by mouth daily.    [provider]  fexofenadine-pseudoephedrine (ALLEGRA-D 24) 180-240 MG 24 hr tablet Take 1 tablet by mouth daily as needed (allergies, sinus pressure).    [provider]  fluticasone -salmeterol (WIXELA INHUB) 250-50 MCG/ACT AEPB Inhale 1 puff into the lungs in the morning and at bedtime. 02/10/23   Jude Harden GAILS, MD  levofloxacin  (LEVAQUIN ) 750 MG tablet Take 1 tablet (750 mg total) by mouth daily. 01/11/23   Neysa Reggy D, MD  Magnesium 250 MG TABS Take 1 tablet by mouth daily.    [provider]  sertraline  (ZOLOFT ) 50 MG tablet Take 100 mg by mouth daily. 12/30/21   [provider]  ursodiol  (ACTIGALL ) 300 MG capsule Take 300 mg by mouth 2 (two) times daily. 11/25/20   [provider]      Allergies    Aspirin, Ibuprofen, and Naproxen sodium    Review of Systems   Review of Systems  Respiratory:  Positive for cough and shortness of breath.   Cardiovascular:  Positive for chest pain.  All other systems reviewed and are negative.   Physical Exam Updated Vital Signs BP 109/67   Pulse 72   Temp 98.1 F (36.7 C)   Resp 18   Wt 108.9 kg   SpO2 97%   BMI 38.74 kg/m  Physical Exam Vitals  and nursing note reviewed.  Constitutional:      General: She is not in acute distress.    Appearance: Normal appearance. She is normal weight. She is not ill-appearing.     Comments: Resting comfortably in bed  HENT:     Head: Normocephalic and atraumatic.  Pulmonary:     Effort: Pulmonary effort is normal. No respiratory distress.  Chest:     Comments: No rashes.  No TTP to left chest or shoulder. Abdominal:     General: Abdomen is flat.  Musculoskeletal:        General: Normal range of motion.     Cervical back: Neck supple.  Skin:    General: Skin is warm and dry.  Neurological:     Mental Status: She is alert and oriented to person,  place, and time.  Psychiatric:        Mood and Affect: Mood normal.        Behavior: Behavior normal.     ED Results / Procedures / Treatments   Labs (all labs ordered are listed, but only abnormal results are displayed) Labs Reviewed  BASIC METABOLIC PANEL - Abnormal; Notable for the following components:      Result Value   Glucose, Bld 105 (*)    All other components within normal limits  CBC - Abnormal; Notable for the following components:   Hemoglobin 11.9 (*)    All other components within normal limits  RESP PANEL BY RT-PCR (RSV, FLU A&B, COVID)  RVPGX2  D-DIMER, QUANTITATIVE  PREGNANCY, URINE  TROPONIN I (HIGH SENSITIVITY)    EKG None  Radiology DG Chest Port 1 View Result Date: 06/11/2023 CLINICAL DATA:  Chest pain. EXAM: PORTABLE CHEST 1 VIEW COMPARISON:  Chest radiograph dated 04/23/2023. FINDINGS: No focal consolidation, pleural effusion, or pneumothorax. The cardiac silhouette is within normal limits. Left breast implant. No acute osseous pathology. IMPRESSION: No active disease. Electronically Signed   By: Vanetta Chou M.D.   On: 06/11/2023 19:10    Procedures Procedures    Medications Ordered in ED Medications  morphine  (PF) 4 MG/ML injection 4 mg (4 mg Intravenous Given 06/11/23 1938)    ED Course/ Medical Decision Making/ A&P             HEART Score: 3                    Medical Decision Making Amount and/or Complexity of Data Reviewed Labs: ordered. Radiology: ordered.  Risk Prescription drug management.  This patient presents to the ED for concern of chest pain, this involves an extensive number of treatment options, and is a complaint that carries with it a high risk of complications and morbidity.  The emergent differential diagnosis of chest pain includes: Acute coronary syndrome, pericarditis, aortic dissection, pulmonary embolism, tension pneumothorax, and esophageal rupture.  I do not believe the patient has an emergent cause of chest  pain, other urgent/non-acute considerations include, but are not limited to: chronic angina, aortic stenosis, cardiomyopathy, myocarditis, mitral valve prolapse, pulmonary hypertension, hypertrophic obstructive cardiomyopathy (HOCM), aortic insufficiency, right ventricular hypertrophy, pneumonia, pleuritis, bronchitis, pneumothorax, tumor, gastroesophageal reflux disease (GERD), esophageal spasm, Mallory-Weiss syndrome, peptic ulcer disease, biliary disease, pancreatitis, functional gastrointestinal pain, cervical or thoracic disk disease or arthritis, shoulder arthritis, costochondritis, subacromial bursitis, anxiety or panic attack, herpes zoster, breast disorders, chest wall tumors, thoracic outlet syndrome, mediastinitis.  My initial workup includes ACS rule out  Additional history obtained from: Nursing notes from this visit.  I  ordered, reviewed and interpreted labs which include: BMP, CBC, troponin, delta troponin, D-dimer, respiratory panel.  No leukocytosis or significant anemia.  No electrolyte derangement or kidney dysfunction.  I ordered imaging studies including chest x-ray I independently visualized and interpreted imaging which showed negative I agree with the radiologist interpretation  Cardiac Monitoring:  The patient was maintained on a cardiac monitor.  I personally viewed and interpreted the cardiac monitored which showed an underlying rhythm of: NSR  Afebrile, hemodynamically stable.  48 year old female presents ED for evaluation of left-sided chest and shoulder pain.  Symptoms have been present for the past 2 weeks.  Symptoms began after she fell onto the left shoulder.  Pain is predictably reproducible with movement of her left shoulder.  No neurologic complaints.  Initial troponin negative.  EKG appears improved from baseline.  Chest x-ray negative.  Lower suspicion for ACS.  D-dimer negative, lower suspicion for PE.  Overall suspect musculoskeletal pain.  Patient was  encouraged to trial Voltaren gel.  She also complains of some shortness of breath.  She is nontachypneic, nontachycardic, nonhypoxic, resting comfortably on room air at 97%.  Lower suspicion for infectious etiology.  She was encouraged to follow-up with her primary care provider in 1 week for reevaluation of her symptoms.  She was given return precautions.  Stable at discharge.  At this time there does not appear to be any evidence of an acute emergency medical condition and the patient appears stable for discharge with appropriate outpatient follow up. Diagnosis was discussed with patient who verbalizes understanding of care plan and is agreeable to discharge. I have discussed return precautions with patient who verbalizes understanding. Patient encouraged to follow-up with their PCP within 1 week. All questions answered.  Note: Portions of this report may have been transcribed using voice recognition software. Every effort was made to ensure accuracy; however, inadvertent computerized transcription errors may still be present.         Final Clinical Impression(s) / ED Diagnoses Final diagnoses:  Atypical chest pain    Rx / DC Orders ED Discharge Orders     None         Edwardo Marsa HERO, DEVONNA 06/11/23 2148    Dreama Longs, MD 06/14/23 1039

## 2023-06-11 NOTE — Discharge Instructions (Signed)
 You have been seen today for your complaint of chest and left shoulder pain. Your lab work was reassuring. Your imaging was reassuring. Your discharge medications include Voltaren gel.  This is an over-the-counter medication you may use to help with your symptoms.  You may also take Tylenol  while using this medication. Follow up with: Your primary care provider Please seek immediate medical care if you develop any of the following symptoms: Your chest pain gets worse. You have a cough that gets worse, or you cough up blood. You have severe pain in your abdomen. You faint. You have sudden, unexplained chest discomfort. You have sudden, unexplained discomfort in your arms, back, neck, or jaw. You have shortness of breath at any time. You suddenly start to sweat, or your skin gets clammy. You feel nausea or you vomit. You suddenly feel lightheaded or dizzy. You have severe weakness, or unexplained weakness or fatigue. Your heart begins to beat quickly, or it feels like it is skipping beats. At this time there does not appear to be the presence of an emergent medical condition, however there is always the potential for conditions to change. Please read and follow the below instructions.  Do not take your medicine if  develop an itchy rash, swelling in your mouth or lips, or difficulty breathing; call 911 and seek immediate emergency medical attention if this occurs.  You may review your lab tests and imaging results in their entirety on your MyChart account.  Please discuss all results of fully with your primary care provider and other specialist at your follow-up visit.  Note: Portions of this text may have been transcribed using voice recognition software. Every effort was made to ensure accuracy; however, inadvertent computerized transcription errors may still be present.

## 2023-06-24 ENCOUNTER — Encounter: Payer: Self-pay | Admitting: Allergy

## 2023-06-24 ENCOUNTER — Other Ambulatory Visit: Payer: Self-pay

## 2023-06-24 ENCOUNTER — Ambulatory Visit (INDEPENDENT_AMBULATORY_CARE_PROVIDER_SITE_OTHER): Payer: 59 | Admitting: Allergy

## 2023-06-24 VITALS — BP 118/78 | HR 81 | Temp 98.0°F | Resp 12

## 2023-06-24 DIAGNOSIS — B999 Unspecified infectious disease: Secondary | ICD-10-CM

## 2023-06-24 DIAGNOSIS — H1013 Acute atopic conjunctivitis, bilateral: Secondary | ICD-10-CM | POA: Diagnosis not present

## 2023-06-24 DIAGNOSIS — J4550 Severe persistent asthma, uncomplicated: Secondary | ICD-10-CM | POA: Diagnosis not present

## 2023-06-24 DIAGNOSIS — J3089 Other allergic rhinitis: Secondary | ICD-10-CM

## 2023-06-24 MED ORDER — AZITHROMYCIN 250 MG PO TABS
ORAL_TABLET | ORAL | 0 refills | Status: DC
Start: 2023-06-24 — End: 2023-07-12

## 2023-06-24 MED ORDER — BUDESONIDE 0.5 MG/2ML IN SUSP: RESPIRATORY_TRACT | 5 refills | Status: DC

## 2023-06-24 MED ORDER — BREZTRI AEROSPHERE 160-9-4.8 MCG/ACT IN AERO: 2.0000 | INHALATION_SPRAY | Freq: Two times a day (BID) | RESPIRATORY_TRACT | 5 refills | Status: DC

## 2023-06-24 NOTE — Patient Instructions (Addendum)
 Asthma     - use Breztri (triple medication therapy) 2 puffs twice a day.   Use with spacer.  Rinse mouth after use.  This replaces Wixela and is a step-up in therapy     - have access to albuterol inhaler 2 puffs or nebulizer 1 vial every 4-6 hours as needed for cough/wheeze/shortness of breath/chest tightness.  May use 15-20 minutes prior to activity.   Monitor frequency of use.          Asthma control goals:  Full participation in all desired activities (may need albuterol before activity) Albuterol use two time or less a week on average (not counting use with activity) Cough interfering with sleep two time or less a month Oral steroids no more than once a year No hospitalizations  Allergic rhinitis with conjunctivitis    - continue avoidance measures for dust mites, cat, dog, grasses, trees, weeds    - your last allergy testing is from 2023     - use Allegra, Xyzal or Zyrtec 1 tab daily for allergy symptom control.      - start Budesonide nasal saline rinses.  Add 1 vial to your saline rinse daily at this time.  If needed can increase to twice a day rinses with budesonide (see below on instructions)  Recurrent sinusitis  -Immunocompetence work-up showed normal immunoglobulin levels however you have poor protective titers to strep pneumo and diphtheria.  You did receive Pneumovax and diphtheria booster vaccines.  You did make an appropriate antibody response to pneumovax up to 52% protective rate in 2023.  Will recheck this now to see if it has dropped  - we discussed several options today including prophylactic antibiotic with Azithromycin vs antibody replacement therapy (antibody infusions)  - will first treat current symptoms with Azithromycin 2 tab day 1, then 1 tab day 2-5 and see how you respond   - sinus CT has shown significant thickening of the lining of the sinuses (maxillary, sphenoid and ethmoid) which can be seen in chronic sinus disease  Follow-up 2-3  months or sooner if  needed    Budesonide (Pulmicort) + Saline Irrigation/Rinse  Budesonide (Pulmicort) is an anti-inflammatory steroid medication used to decrease nasal and sinus inflammation. It is dispensed in liquid form in a vial. Although it is manufactured for use with a nebulizer, we intend for you to use it with the NeilMed Sinus Rinse bottle (preferred) or a Neti pot.   Instructions:  1) Make 240cc of saline in the NeilMed bottle using the salt packets or your own saline recipe (see separate handout).  2) Add the entire 2cc vial of liquid Budesonide (Pulmicort) to the rinse bottle and mix together.  3) While in the shower or over the sink, tilt your head forward to a comfortable level. Put the tip of the sinus rinse bottle in your nostril and aim it towards the crown or top of your head. Gently squeeze the bottle to flush out your nose. The fluid will circulate in and out of your sinus cavities, coming back out from either nostril or through your mouth. Try not to swallow large quantities and spit it out instead.  4) Perform Budesonide (Pulmicort) + Saline irrigations 2 times daily.

## 2023-06-24 NOTE — Progress Notes (Signed)
 Follow-up Note  RE: Monique Harrison MRN: [id number] DOB: [birth year] Date of Office Visit: 06/24/2023   History of present illness: Monique Harrison is a 48 y.o. female presenting today for follow-up of asthma, chronic rhinitis with allergic component and recurrent sinusitis/infections.  She was last seen in the office on 07/22/2021 by myself. Discussed the use of AI scribe software for clinical note transcription with the patient, who gave verbal consent to proceed.  She has chronic sinus disease characterized by persistent sinus inflammation and congestion, which have been difficult to manage with standard nasal sprays. She uses saline rinses daily.  She has not found traditional nasal sprays like Atrovent, Ryaltris, Dymista that has been used in the past to be very effective.  She is still quite congested with sinus pressure.  After her last visit with me I referred her to ENT where she saw Scot Jun in 2023 and on nasal endoscopy had minimal crusting of the anterior nasal cavity, thick cloudy mucus bilaterally.  At that time she was prescribed Augmentin 14-day course of prednisone taper.  A sinus CT was then performed in July 2023:  Mucosal inflammatory disease affecting the paranasal sinuses as above. Mucosal thickening and fluid at the frontal ethmoid junction region on the right, scattered bilateral ethmoid air cells, both maxillary sinuses right worse than left and both divisions of the sphenoid sinus.  He recommended possible surgical interventions but she wanted to avoid this and so he recommended she return back to allergist to discuss immunotherapy. A repeat CT sinus from August 2024: Layering fluid in the right maxillary and left sphenoid sinuses with scattered mucosal thickening elsewhere as above. Findings could reflect acute sinusitis in the correct clinical setting.  Her asthma management has been challenging. She previously used a Clinical cytogeneticist inhaler last year, which she  found more effective, but is currently on Wixela due to insurance coverage. She uses Ventolin daily for rescue, indicating suboptimal control.  He states he continues to cough a lot.  And it is typically productive and she brings up thick green sputum.  She did start seeing pulmonology, Dr. Maple Hudson, after she required hospitalization for community-acquired pneumonia where she developed effusion that was exudative that required a chest tube for drainage in August 2023.  Most recently seen by Dr. Maple Hudson in September 2024 where she was recommended a course of Levaquin for chronic maxillary sinusitis as she continues to have sinus symptoms and thick discolored mucus.  She does not recall if this helped her symptoms at all. Recent chest x-ray from 06/11/2023: FINDINGS: No focal consolidation, pleural effusion, or pneumothorax. The cardiac silhouette is within normal limits. Left breast implant. No acute osseous pathology.   IMPRESSION: No active disease.  When she had pneumonia on imaging there were some concerns with the liver and she was found to have a bile duct obstruction leading to cholangitis.  She has been following with gastroenterology where she has had imaging and procedures to help relieve the biliary obstruction.  She states she has had definite improvement in symptoms related to this biliary obstruction being relieved.  She has a history of allergies, with positive tests for dust mites, cat, dog, grasses, trees, and weed pollens from March 2023. Allergy shots have been discussed but not started due to concerns about her breathing control.  She has experienced frequent infections, including sinus infections and pneumonia.  She states she had COVID illness last year.  She continues to have a persistent what seems like sinus infection.  In 2023, her immune function was evaluated, showing a 52% protective rate for strep titers after a booster up from 22%, which is considered borderline response for an  adult.     She has not had flu vaccine this season.  She has had a largely negative autoimmune workup from 2023 including anti-DNA antibody, CCP antibody, anti-smooth muscle antibody, Mitochondrial antibodies, ANA, IgG subclass, IgE, C4, C3, ANCA titers.  RF factor was positive  Review of systems: 10pt ROS negative unless noted above in HPI   All other systems negative unless noted above in HPI  Past medical/social/surgical/family history have been reviewed and are unchanged unless specifically indicated below.  No changes  Medication List: Current Outpatient Medications  Medication Sig Dispense Refill   albuterol (VENTOLIN HFA) 108 (90 Base) MCG/ACT inhaler Inhale 1-2 puffs into the lungs every 6 (six) hours as needed for wheezing or shortness of breath. 1 each 0   benzonatate (TESSALON) 100 MG capsule Take 1 capsule (100 mg total) by mouth every 8 (eight) hours. 21 capsule 0   busPIRone (BUSPAR) 5 MG tablet Take 5 mg by mouth 3 (three) times daily.     Cholecalciferol (VITAMIN D3) 125 MCG (5000 UT) CAPS Take 5,000 Units by mouth daily.     Magnesium 250 MG TABS Take 1 tablet by mouth daily.     sertraline (ZOLOFT) 50 MG tablet Take 100 mg by mouth daily.     ursodiol (ACTIGALL) 300 MG capsule Take 300 mg by mouth 2 (two) times daily.     Budeson-Glycopyrrol-Formoterol (BREZTRI AEROSPHERE) 160-9-4.8 MCG/ACT AERO Inhale 2 puffs into the lungs in the morning and at bedtime. (Patient not taking: Reported on 01/11/2023) 2 each 0   fexofenadine-pseudoephedrine (ALLEGRA-D 24) 180-240 MG 24 hr tablet Take 1 tablet by mouth daily as needed (allergies, sinus pressure). (Patient not taking: Reported on 06/24/2023)     pantoprazole (PROTONIX) 40 MG tablet Take 40 mg by mouth daily.     No current facility-administered medications for this visit.     Known medication allergies: Allergies  Allergen Reactions   Aspirin Shortness Of Breath    Pt tolerates aspirin for occasional use.   Ibuprofen  Itching and Swelling    Tongue itches and swells    Can tolerate ibuprofen   Naproxen Sodium Other (See Comments)    Cause bleeding     Physical examination: Blood pressure 118/78, pulse 81, temperature 98 F (36.7 C), temperature source Temporal, resp. rate 12, SpO2 96%.  General: Alert, interactive, in no acute distress. HEENT: PERRLA, TMs pearly gray, turbinates moderately edematous with thick discharge, post-pharynx non erythematous. Neck: Supple without lymphadenopathy. Lungs: Mildly decreased breath sounds bilaterally without wheezing, rhonchi or rales. {no increased work of breathing. CV: Normal S1, S2 without murmurs. Abdomen: Nondistended, nontender. Skin: Warm and dry, without lesions or rashes. Extremities:  No clubbing, cyanosis or edema. Neuro:   Grossly intact.  Diagnositics/Labs: Labs:  Component     Latest Ref Rng 12/20/2022  WBC     4.0 - 10.5 K/uL 12.0 (H)   RBC     3.87 - 5.11 Mil/uL 4.17   Hemoglobin     12.0 - 15.0 g/dL 16.1   HCT     09.6 - 04.5 % 38.0   MCV     78.0 - 100.0 fl 91.1   MCHC     30.0 - 36.0 g/dL 40.9   RDW     81.1 - 91.4 % 15.2   Platelets  150.0 - 400.0 K/uL 327.0   Neutrophils     43.0 - 77.0 % 78.2 (H)   Lymphocytes     12.0 - 46.0 % 12.7   Monocytes Relative     3.0 - 12.0 % 5.2   Eosinophil     0.0 - 5.0 % 3.0   Basophil     0.0 - 3.0 % 0.9   NEUT#     1.4 - 7.7 K/uL 9.4 (H)   Lymphs Abs     0.7 - 4.0 K/uL 1.5   Monocyte #     0.1 - 1.0 K/uL 0.6   Eosinophils Absolute     0.0 - 0.7 K/uL 0.4   Basophils Absolute     0.0 - 0.1 K/uL 0.1   IgE (Immunoglobulin E), Serum     <OR=114 kU/L 97    Component     Latest Ref Rng 07/22/2021  IgE (Immunoglobulin E), Serum     6 - 495 IU/mL 96   D Pteronyssinus IgE     Class 0/I kU/L 0.19 !   D Farinae IgE     Class I kU/L 0.50 !   Cat Dander IgE     Class IV kU/L 9.02 !   Dog Dander IgE     Class II kU/L 1.39 !   [place] Grass IgE     Class II kU/L 0.57 !    Timothy Grass IgE     Class III kU/L 2.48 !   [place] Grass IgE     Class III kU/L 1.53 !   Cockroach, [place] IgE     Class 0 kU/L <0.10   Penicillium Chrysogen IgE     Class 0 kU/L <0.10   Cladosporium Herbarum IgE     Class 0 kU/L <0.10   Aspergillus Fumigatus IgE     Class 0 kU/L <0.10   Alternaria Alternata IgE     Class 0 kU/L <0.10   Maple/Box Elder IgE     Class 0 kU/L <0.10   Common Silver Charletta Cousin IgE     Class 0 kU/L <0.10   Cedar, [place] IgE     Class II kU/L 1.38 !   Oak, [place] IgE     Class II kU/L 0.78 !   Elm, American IgE     Class 0/I kU/L 0.11 !   [place] IgE     Class 0 kU/L <0.10   Pecan, [place] IgE     Class 0/I kU/L 0.11 !   White Mulberry IgE     Class 0 kU/L <0.10   Ragweed, Short IgE     Class 0/I kU/L 0.11 !   Pigweed, Rough IgE     Class 0 kU/L <0.10   Sheep Sorrel IgE Qn     Class 0 kU/L <0.10   Mouse Urine IgE     Class 0 kU/L <0.10     Spirometry: FEV1: 1.22L 47%, FVC: 1.68L 52% predicted, pattern is however low but stable from prior studies.   Assessment and plan: Asthma, not under good control     - use Breztri (triple medication therapy) 2 puffs twice a day.   Use with spacer.  Rinse mouth after use.  This replaces Wixela and is a step-up in therapy     - have access to albuterol inhaler 2 puffs or nebulizer 1 vial every 4-6 hours as needed for cough/wheeze/shortness of breath/chest tightness.  May use 15-20 minutes prior to activity.   Monitor  frequency of use.          Asthma control goals:  Full participation in all desired activities (may need albuterol before activity) Albuterol use two time or less a week on average (not counting use with activity) Cough interfering with sleep two time or less a month Oral steroids no more than once a year No hospitalizations  Allergic rhinitis with conjunctivitis    - continue avoidance measures for dust mites, cat, dog, grasses, trees, weeds    - your last allergy testing is from  2023     - use Allegra, Xyzal or Zyrtec 1 tab daily for allergy symptom control.      - start Budesonide nasal saline rinses.  Add 1 vial to your saline rinse daily at this time.  If needed can increase to twice a day rinses with budesonide (see below on instructions)  Recurrent sinusitis/infections  -Immunocompetence work-up showed normal immunoglobulin levels however you have poor protective titers to strep pneumo and diphtheria.  You did receive Pneumovax and diphtheria booster vaccines.  You did make an appropriate antibody response to pneumovax up to 52% protective rate in 2023.  Will recheck this now to see if it has dropped  - we discussed several options today including prophylactic antibiotic with Azithromycin vs antibody replacement therapy (antibody infusions)  - will first treat current symptoms with Azithromycin 2 tab day 1, then 1 tab day 2-5 and see how you respond   - sinus CT has shown significant thickening of the lining of the sinuses (maxillary, sphenoid and ethmoid) which can be seen in chronic sinus disease  Follow-up 2-3 months or sooner if needed  I appreciate the opportunity to take part in Debar's care. Please do not hesitate to contact me with questions.  Sincerely,   Margo Aye, MD Allergy/Immunology [facility]

## 2023-06-30 LAB — STREP PNEUMONIAE 23 SEROTYPES IGG
Pneumo Ab Type 1*: 0.6 ug/mL — ABNORMAL LOW (ref >1.3)
Pneumo Ab Type 12 (12F)*: 0.3 ug/mL — ABNORMAL LOW (ref >1.3)
Pneumo Ab Type 17 (17F)*: 4.9 ug/mL (ref >1.3)
Pneumo Ab Type 19 (19F)*: 1.7 ug/mL (ref >1.3)
Pneumo Ab Type 2*: 1.6 ug/mL (ref >1.3)
Pneumo Ab Type 20*: 1.6 ug/mL (ref >1.3)
Pneumo Ab Type 22 (22F)*: 0.5 ug/mL — ABNORMAL LOW (ref >1.3)
Pneumo Ab Type 23 (23F)*: 2.5 ug/mL (ref >1.3)
Pneumo Ab Type 26 (6B)*: 3.7 ug/mL (ref >1.3)
Pneumo Ab Type 3*: 0.1 ug/mL — ABNORMAL LOW (ref >1.3)
Pneumo Ab Type 4*: 0.1 ug/mL — ABNORMAL LOW (ref >1.3)
Pneumo Ab Type 43 (11A)*: 0.9 ug/mL — ABNORMAL LOW (ref >1.3)
Pneumo Ab Type 5*: 0.3 ug/mL — ABNORMAL LOW (ref >1.3)
Pneumo Ab Type 51 (7F)*: 3.4 ug/mL (ref >1.3)
Pneumo Ab Type 54 (15B)*: 14.8 ug/mL (ref >1.3)
Pneumo Ab Type 56 (18C)*: 1 ug/mL — ABNORMAL LOW (ref >1.3)
Pneumo Ab Type 57 (19A)*: 1.4 ug/mL (ref >1.3)
Pneumo Ab Type 68 (9V)*: 2 ug/mL (ref >1.3)
Pneumo Ab Type 70 (33F)*: 0.9 ug/mL — ABNORMAL LOW (ref >1.3)
Pneumo Ab Type 8*: 3.1 ug/mL (ref >1.3)
Pneumo Ab Type 9 (9N)*: 6.1 ug/mL (ref >1.3)

## 2023-07-02 ENCOUNTER — Telehealth: Admitting: Family Medicine

## 2023-07-02 DIAGNOSIS — J45901 Unspecified asthma with (acute) exacerbation: Secondary | ICD-10-CM

## 2023-07-02 MED ORDER — ALBUTEROL SULFATE (2.5 MG/3ML) 0.083% IN NEBU: 2.5000 mg | INHALATION_SOLUTION | Freq: Four times a day (QID) | RESPIRATORY_TRACT | 0 refills | Status: DC | PRN

## 2023-07-02 MED ORDER — PREDNISONE 10 MG (21) PO TBPK: ORAL_TABLET | ORAL | 0 refills | Status: DC

## 2023-07-02 NOTE — Patient Instructions (Signed)
 Joretta Bachelor, thank you for joining Reed Pandy, PA-C for today's virtual visit.  While this provider is not your primary care provider (PCP), if your PCP is located in our provider database this encounter information will be shared with them immediately following your visit.   A [facility] MyChart account gives you access to today's visit and all your visits, tests, and labs performed at [facility] " click here if you don't have a [facility] MyChart account or go to [email or web address]  Consent: (Patient) Tyesha Joffe provided verbal consent for this virtual visit at the beginning of the encounter.  Current Medications:  Current Outpatient Medications:    albuterol (PROVENTIL) (2.5 MG/3ML) 0.083% nebulizer solution, Take 3 mLs (2.5 mg total) by nebulization every 6 (six) hours as needed for up to 13 days for wheezing or shortness of breath., Disp: 150 mL, Rfl: 0   predniSONE (STERAPRED UNI-PAK 21 TAB) 10 MG (21) TBPK tablet, Take following package directions., Disp: 21 tablet, Rfl: 0   albuterol (VENTOLIN HFA) 108 (90 Base) MCG/ACT inhaler, Inhale 1-2 puffs into the lungs every 6 (six) hours as needed for wheezing or shortness of breath., Disp: 1 each, Rfl: 0   azithromycin (ZITHROMAX) 250 MG tablet, Take 2 tabs (500mg ) day 1, then 1 tab (250mg ) day 2-5, Disp: 6 each, Rfl: 0   benzonatate (TESSALON) 100 MG capsule, Take 1 capsule (100 mg total) by mouth every 8 (eight) hours., Disp: 21 capsule, Rfl: 0   Budeson-Glycopyrrol-Formoterol (BREZTRI AEROSPHERE) 160-9-4.8 MCG/ACT AERO, Inhale 2 puffs into the lungs in the morning and at bedtime. (Patient not taking: Reported on 01/11/2023), Disp: 2 each, Rfl: 0   Budeson-Glycopyrrol-Formoterol (BREZTRI AEROSPHERE) 160-9-4.8 MCG/ACT AERO, Inhale 2 puffs into the lungs in the morning and at bedtime., Disp: 10.7 g, Rfl: 5   budesonide (PULMICORT) 0.5 MG/2ML nebulizer solution, Use 1 vial mixed with nasal saline rinse 1-2 times  daily, Disp: 75 mL, Rfl: 5   busPIRone (BUSPAR) 5 MG tablet, Take 5 mg by mouth 3 (three) times daily., Disp: , Rfl:    Cholecalciferol (VITAMIN D3) 125 MCG (5000 UT) CAPS, Take 5,000 Units by mouth daily., Disp: , Rfl:    fexofenadine-pseudoephedrine (ALLEGRA-D 24) 180-240 MG 24 hr tablet, Take 1 tablet by mouth daily as needed (allergies, sinus pressure). (Patient not taking: Reported on 06/24/2023), Disp: , Rfl:    Magnesium 250 MG TABS, Take 1 tablet by mouth daily., Disp: , Rfl:    pantoprazole (PROTONIX) 40 MG tablet, Take 40 mg by mouth daily., Disp: , Rfl:    sertraline (ZOLOFT) 50 MG tablet, Take 100 mg by mouth daily., Disp: , Rfl:    ursodiol (ACTIGALL) 300 MG capsule, Take 300 mg by mouth 2 (two) times daily., Disp: , Rfl:    Medications ordered in this encounter:  Meds ordered this encounter  Medications   predniSONE (STERAPRED UNI-PAK 21 TAB) 10 MG (21) TBPK tablet    Sig: Take following package directions.    Dispense:  21 tablet    Refill:  0    Please dispense one standard blister pack taper.   albuterol (PROVENTIL) (2.5 MG/3ML) 0.083% nebulizer solution    Sig: Take 3 mLs (2.5 mg total) by nebulization every 6 (six) hours as needed for up to 13 days for wheezing or shortness of breath.    Dispense:  150 mL    Refill:  0     *If you need refills on other medications prior to your next appointment, please contact  your pharmacy*  Follow-Up: Call back or seek an in-person evaluation if the symptoms worsen or if the condition fails to improve as anticipated.  [facility] [phone]  Other Instructions Acute Bronchitis, Adult  Acute bronchitis is sudden inflammation of the main airways (bronchi) that come off the windpipe (trachea) in the lungs. The swelling causes the airways to get smaller and make more mucus than normal. This can make it hard to breathe and can cause coughing or noisy breathing (wheezing). Acute bronchitis may last several weeks. The  cough may last longer. Allergies, asthma, and exposure to smoke may make the condition worse. What are the causes? This condition can be caused by germs and by substances that irritate the lungs, including: Cold and flu viruses. The most common cause of this condition is the virus that causes the common cold. Bacteria. This is less common. Breathing in substances that irritate the lungs, including: Smoke from cigarettes and other forms of tobacco. Dust and pollen. Fumes from household cleaning products, gases, or burned fuel. Indoor or outdoor air pollution. What increases the risk? The following factors may make you more likely to develop this condition: A weak body's defense system, also called the immune system. A condition that affects your lungs and breathing, such as asthma. What are the signs or symptoms? Common symptoms of this condition include: Coughing. This may bring up clear, yellow, or green mucus from your lungs (sputum). Wheezing. Runny or stuffy nose. Having too much mucus in your lungs (chest congestion). Shortness of breath. Aches and pains, including sore throat or chest. How is this diagnosed? This condition is usually diagnosed based on: Your symptoms and medical history. A physical exam. You may also have other tests, including tests to rule out other conditions, such as pneumonia. These tests include: A test of lung function. Test of a mucus sample to look for the presence of bacteria. Tests to check the oxygen level in your blood. Blood tests. Chest X-ray. How is this treated? Most cases of acute bronchitis clear up over time without treatment. Your health care provider may recommend: Drinking more fluids to help thin your mucus so it is easier to cough up. Taking inhaled medicine (inhaler) to improve air flow in and out of your lungs. Using a vaporizer or a humidifier. These are machines that add water to the air to help you breathe better. Taking a  medicine that thins mucus and clears congestion (expectorant). Taking a medicine that prevents or stops coughing (cough suppressant). It is not common to take an antibiotic medicine for this condition. Follow these instructions at home:  Take over-the-counter and prescription medicines only as told by your health care provider. Use an inhaler, vaporizer, or humidifier as told by your health care provider. Take two teaspoons (10 mL) of honey at bedtime to lessen coughing at night. Drink enough fluid to keep your urine pale yellow. Do not use any products that contain nicotine or tobacco. These products include cigarettes, chewing tobacco, and vaping devices, such as e-cigarettes. If you need help quitting, ask your health care provider. Get plenty of rest. Return to your normal activities as told by your health care provider. Ask your health care provider what activities are safe for you. Keep all follow-up visits. This is important. How is this prevented? To lower your risk of getting this condition again: Wash your hands often with soap and water for at least 20 seconds. If soap and water are not available, use hand  sanitizer. Avoid contact with people who have cold symptoms. Try not to touch your mouth, nose, or eyes with your hands. Avoid breathing in smoke or chemical fumes. Breathing smoke or chemical fumes will make your condition worse. Get the flu shot every year. Contact a health care provider if: Your symptoms do not improve after 2 weeks. You have trouble coughing up the mucus. Your cough keeps you awake at night. You have a fever. Get help right away if you: Cough up blood. Feel pain in your chest. Have severe shortness of breath. Faint or keep feeling like you are going to faint. Have a severe headache. Have a fever or chills that get worse. These symptoms may represent a serious problem that is an emergency. Do not wait to see if the symptoms will go away. Get medical  help right away. Call your local emergency services (911 in the [place] Do not drive yourself to the hospital. Summary Acute bronchitis is inflammation of the main airways (bronchi) that come off the windpipe (trachea) in the lungs. The swelling causes the airways to get smaller and make more mucus than normal. Drinking more fluids can help thin your mucus so it is easier to cough up. Take over-the-counter and prescription medicines only as told by your health care provider. Do not use any products that contain nicotine or tobacco. These products include cigarettes, chewing tobacco, and vaping devices, such as e-cigarettes. If you need help quitting, ask your health care provider. Contact a health care provider if your symptoms do not improve after 2 weeks. This information is not intended to replace advice given to you by your health care provider. Make sure you discuss any questions you have with your health care provider. Document Revised: 07/30/2021 Document Reviewed: 08/20/2020 Elsevier Patient Education  2024 [employer].   If you have been instructed to have an in-person evaluation today at a local Urgent Care facility, please use the link below. It will take you to a list of all of our available [facility], including address, phone number and hours of operation. Please do not delay care.  [facility]  If you or a family member do not have a primary care provider, use the link below to schedule a visit and establish care. When you choose a [facility] primary care physician or advanced practice provider, you gain a long-term partner in health. Find a Primary Care Provider  Learn more about [facility]'s in-office and virtual care options: [facility] - Get Care Now

## 2023-07-02 NOTE — Progress Notes (Signed)
 Virtual Visit Consent   Monique Harrison, you are scheduled for a virtual visit with a [facility] provider today. Just as with appointments in the office, your consent must be obtained to participate. Your consent will be active for this visit and any virtual visit you may have with one of our providers in the next 365 days. If you have a MyChart account, a copy of this consent can be sent to you electronically.  As this is a virtual visit, video technology does not allow for your provider to perform a traditional examination. This may limit your provider's ability to fully assess your condition. If your provider identifies any concerns that need to be evaluated in person or the need to arrange testing (such as labs, EKG, etc.), we will make arrangements to do so. Although advances in technology are sophisticated, we cannot ensure that it will always work on either your end or our end. If the connection with a video visit is poor, the visit may have to be switched to a telephone visit. With either a video or telephone visit, we are not always able to ensure that we have a secure connection.  By engaging in this virtual visit, you consent to the provision of healthcare and authorize for your insurance to be billed (if applicable) for the services provided during this visit. Depending on your insurance coverage, you may receive a charge related to this service.  I need to obtain your verbal consent now. Are you willing to proceed with your visit today? Ian Cavey has provided verbal consent on 07/02/2023 for a virtual visit (video or telephone). Reed Pandy, [place]  Date: 07/02/2023 2:10 PM   Virtual Visit via Video Note   I, Reed Pandy, connected with  Monique Harrison  ([id number], December 27, 1975) on 07/02/23 at  2:00 PM EST by a video-enabled telemedicine application and verified that I am speaking with the correct person using two identifiers.  Location: Patient: Virtual Visit Location Patient:  Home Provider: Virtual Visit Location Provider: Home Office   I discussed the limitations of evaluation and management by telemedicine and the availability of in person appointments. The patient expressed understanding and agreed to proceed.    History of Present Illness: Monique Harrison is a 48 y.o. who identifies as a female who was assigned female at birth, and is being seen today for c/o feeling awful.  Pt states yesterday she had a temperature of 101.5.  Pt states she has asthma.  She uses albuterol inhaler and was prescribed a new inhaler called Breztri.  Pt states has been using albuterol inhaler more often.  Pt states she has nebulizer machine and is wanting refills on nebulizer solution. Pt states wheezing more especially when she lays down.  Pt states she just finished a round of antibiotics, z-pac. Pt states her phlegm has improved from green to clear.   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   Ibuprofen Itching and Swelling    Tongue itches and swells    Can tolerate ibuprofen   Naproxen Sodium Other (See Comments)    Cause bleeding   Medications:  Current Outpatient  Medications:    albuterol (PROVENTIL) (2.5 MG/3ML) 0.083% nebulizer solution, Take 3 mLs (2.5 mg total) by nebulization every 6 (six) hours as needed for up to 13 days for wheezing or shortness of breath., Disp: 150 mL, Rfl: 0   predniSONE (STERAPRED UNI-PAK 21 TAB) 10 MG (21) TBPK tablet, Take following package directions., Disp: 21 tablet, Rfl: 0   albuterol (VENTOLIN HFA) 108 (90 Base) MCG/ACT inhaler, Inhale 1-2 puffs into the lungs every 6 (six) hours as needed for wheezing or shortness of breath., Disp: 1 each, Rfl: 0   azithromycin (ZITHROMAX) 250 MG tablet, Take 2 tabs (500mg ) day 1, then 1 tab (250mg ) day 2-5, Disp: 6 each, Rfl: 0   benzonatate (TESSALON) 100 MG capsule, Take 1 capsule (100 mg total) by mouth every 8 (eight) hours., Disp: 21 capsule, Rfl: 0   Budeson-Glycopyrrol-Formoterol (BREZTRI AEROSPHERE) 160-9-4.8 MCG/ACT AERO, Inhale 2 puffs into the lungs in the morning and at bedtime. (Patient not taking: Reported on 01/11/2023), Disp: 2 each, Rfl: 0   Budeson-Glycopyrrol-Formoterol (BREZTRI AEROSPHERE) 160-9-4.8 MCG/ACT AERO, Inhale 2 puffs into the lungs in the morning and at bedtime., Disp: 10.7 g, Rfl: 5   budesonide (PULMICORT) 0.5 MG/2ML nebulizer solution, Use 1 vial mixed with nasal saline rinse 1-2 times daily, Disp: 75 mL, Rfl: 5   busPIRone (BUSPAR) 5 MG tablet, Take 5 mg by mouth 3 (three) times daily., Disp: , Rfl:    Cholecalciferol (VITAMIN D3) 125 MCG (5000 UT) CAPS, Take 5,000 Units by mouth daily., Disp: , Rfl:    fexofenadine-pseudoephedrine (ALLEGRA-D 24) 180-240 MG 24 hr tablet, Take 1 tablet by mouth daily as needed (allergies, sinus pressure). (Patient not taking: Reported on 06/24/2023), Disp: , Rfl:    Magnesium 250 MG TABS, Take 1 tablet by mouth daily., Disp: , Rfl:    pantoprazole (PROTONIX) 40 MG tablet, Take 40 mg by mouth daily., Disp: , Rfl:    sertraline (ZOLOFT) 50 MG tablet, Take 100 mg by mouth daily., Disp: , Rfl:    ursodiol (ACTIGALL) 300 MG  capsule, Take 300 mg by mouth 2 (two) times daily., Disp: , Rfl:   Observations/Objective: Patient is well-developed, well-nourished in no acute distress.  Resting comfortably at home.  Head is normocephalic, atraumatic.  No labored breathing.  Speech is clear and coherent with logical content.  Patient is alert and oriented at baseline.    Assessment and Plan: 1. Moderate asthma with exacerbation, unspecified whether persistent (Primary) - predniSONE (STERAPRED UNI-PAK 21 TAB) 10 MG (21) TBPK tablet; Take following package directions.  Dispense: 21 tablet; Refill: 0 - albuterol (PROVENTIL) (2.5 MG/3ML) 0.083% nebulizer solution; Take 3 mLs (2.5 mg total) by nebulization every 6 (six) hours as needed for up to 13 days for wheezing or shortness of breath.  Dispense: 150 mL; Refill: 0  -Will treat with prednisone and albuterol nebulizer solution  -Pt to follow up with PCP or urgent care if symptoms persist or worsen   Follow Up Instructions: I discussed the assessment and treatment plan with the patient. The patient was provided an opportunity to  ask questions and all were answered. The patient agreed with the plan and demonstrated an understanding of the instructions.  A copy of instructions were sent to the patient via MyChart unless otherwise noted below.     The patient was advised to call back or seek an in-person evaluation if the symptoms worsen or if the condition fails to improve as anticipated.    Reed Pandy, PA-C

## 2023-07-06 ENCOUNTER — Other Ambulatory Visit: Payer: Self-pay

## 2023-07-06 MED ORDER — AZITHROMYCIN 500 MG PO TABS: ORAL_TABLET | ORAL | 0 refills | Status: AC

## 2023-07-12 ENCOUNTER — Encounter: Payer: Self-pay | Admitting: Allergy

## 2023-07-12 ENCOUNTER — Telehealth: Admitting: Physician Assistant

## 2023-07-12 DIAGNOSIS — R112 Nausea with vomiting, unspecified: Secondary | ICD-10-CM

## 2023-07-12 MED ORDER — ONDANSETRON 4 MG PO TBDP
4.0000 mg | ORAL_TABLET | Freq: Three times a day (TID) | ORAL | 0 refills | Status: AC | PRN
Start: 2023-07-12 — End: ?

## 2023-07-12 MED ORDER — SACCHAROMYCES BOULARDII 250 MG PO CAPS: 250.0000 mg | ORAL_CAPSULE | Freq: Two times a day (BID) | ORAL | 0 refills | Status: AC

## 2023-07-12 NOTE — Patient Instructions (Signed)
 Monique Harrison, thank you for joining [name], PA-C for today's virtual visit.  While this provider is not your primary care provider (PCP), if your PCP is located in our provider database this encounter information will be shared with them immediately following your visit.   A [facility] MyChart account gives you access to today's visit and all your visits, tests, and labs performed at [facility] " click here if you don't have a [facility] MyChart account or go to [email or web address]  Consent: (Patient) Monique Harrison provided verbal consent for this virtual visit at the beginning of the encounter.  Current Medications:  Current Outpatient Medications:    ondansetron (ZOFRAN-ODT) 4 MG disintegrating tablet, Take 1 tablet (4 mg total) by mouth every 8 (eight) hours as needed for nausea or vomiting., Disp: 20 tablet, Rfl: 0   saccharomyces boulardii (FLORASTOR) 250 MG capsule, Take 1 capsule (250 mg total) by mouth 2 (two) times daily., Disp: 60 capsule, Rfl: 0   albuterol (PROVENTIL) (2.5 MG/3ML) 0.083% nebulizer solution, Take 3 mLs (2.5 mg total) by nebulization every 6 (six) hours as needed for up to 13 days for wheezing or shortness of breath., Disp: 150 mL, Rfl: 0   albuterol (VENTOLIN HFA) 108 (90 Base) MCG/ACT inhaler, Inhale 1-2 puffs into the lungs every 6 (six) hours as needed for wheezing or shortness of breath., Disp: 1 each, Rfl: 0   azithromycin (ZITHROMAX) 500 MG tablet, 1 tablet by mouth 3 times a week - Monday, Wednesday, Friday for 3 months., Disp: 52 tablet, Rfl: 0   benzonatate (TESSALON) 100 MG capsule, Take 1 capsule (100 mg total) by mouth every 8 (eight) hours., Disp: 21 capsule, Rfl: 0   Budeson-Glycopyrrol-Formoterol (BREZTRI AEROSPHERE) 160-9-4.8 MCG/ACT AERO, Inhale 2 puffs into the lungs in the morning and at bedtime. (Patient not taking: Reported on 01/11/2023), Disp: 2 each, Rfl: 0   Budeson-Glycopyrrol-Formoterol (BREZTRI  AEROSPHERE) 160-9-4.8 MCG/ACT AERO, Inhale 2 puffs into the lungs in the morning and at bedtime., Disp: 10.7 g, Rfl: 5   budesonide (PULMICORT) 0.5 MG/2ML nebulizer solution, Use 1 vial mixed with nasal saline rinse 1-2 times daily, Disp: 75 mL, Rfl: 5   busPIRone (BUSPAR) 5 MG tablet, Take 5 mg by mouth 3 (three) times daily., Disp: , Rfl:    Cholecalciferol (VITAMIN D3) 125 MCG (5000 UT) CAPS, Take 5,000 Units by mouth daily., Disp: , Rfl:    fexofenadine-pseudoephedrine (ALLEGRA-D 24) 180-240 MG 24 hr tablet, Take 1 tablet by mouth daily as needed (allergies, sinus pressure). (Patient not taking: Reported on 06/24/2023), Disp: , Rfl:    Magnesium 250 MG TABS, Take 1 tablet by mouth daily., Disp: , Rfl:    pantoprazole (PROTONIX) 40 MG tablet, Take 40 mg by mouth daily., Disp: , Rfl:    sertraline (ZOLOFT) 50 MG tablet, Take 100 mg by mouth daily., Disp: , Rfl:    ursodiol (ACTIGALL) 300 MG capsule, Take 300 mg by mouth 2 (two) times daily., Disp: , Rfl:    Medications ordered in this encounter:  Meds ordered this encounter  Medications   ondansetron (ZOFRAN-ODT) 4 MG disintegrating tablet    Sig: Take 1 tablet (4 mg total) by mouth every 8 (eight) hours as needed for nausea or vomiting.    Dispense:  20 tablet    Refill:  0    Supervising Provider:   Merrilee Jansky [[id number]]   saccharomyces boulardii (FLORASTOR) 250 MG capsule    Sig: Take 1 capsule (250 mg total) by  mouth 2 (two) times daily.    Dispense:  60 capsule    Refill:  0    Supervising Provider:   Merrilee Jansky [[id number]]     *If you need refills on other medications prior to your next appointment, please contact your pharmacy*  Follow-Up: Call back or seek an in-person evaluation if the symptoms worsen or if the condition fails to improve as anticipated.  [facility] [phone]  Other Instructions Please keep hydrated and rest.  Follow dietary recommendations below. Continue Protonix  daily. Start daily probiotic I have sent in for you. Take the Zofran as directed, when needed for nausea/vomiting. If not resolving, or any new/worsening symptoms despite treatment, please seek an in-person evaluation ASAP  Bland Diet A bland diet may consist of soft foods or foods that are not high in fat or are not greasy, acidic, or spicy. Avoiding certain foods may cause less irritation to your mouth, throat, stomach, or gastrointestinal tract. Avoiding certain foods may make you feel better. Everyone's tolerances are different. A bland diet should be based on what you can tolerate and what may cause discomfort. What is my plan? Your health care provider or dietitian may recommend specific changes to your diet to treat your symptoms. These changes may include: Eating small meals frequently. Cooking food until it is soft enough to chew easily. Taking the time to chew your food thoroughly, so it is easy to swallow and digest. Avoiding foods that cause you discomfort. These may include spicy food, fried food, greasy foods, hard-to-chew foods, or citrus fruits and juices. Drinking slowly. What are tips for following this plan? Reading food labels To reduce fiber intake, look for food labels that say "whole," such as whole wheat or whole grain. Shopping Avoid food items that may have nuts or seeds. Avoid vegetables that may make you gassy or have a tough texture, such as broccoli, cauliflower, or corn. Cooking Cook foods thoroughly so they have a soft texture. Meal planning Make sure you include foods from all food groups to eat a balanced diet. Eat a variety of types of foods. Eat foods and drink beverages that do not cause you discomfort. These may include soups and broths with cooked meats, pasta, and vegetables. Lifestyle Sit up after meals, avoid tight clothing, and take time to eat and chew your food slowly. Ask your health care provider whether you should take dietary  supplements. General information Mildly season your foods. Some seasonings, such as cayenne pepper, vinegar, or hot sauce, may cause irritation. The foods, beverages, or seasonings to avoid should be based on individual tolerance. What foods should I eat? Fruits Canned or cooked fruit such as peaches, pears, or applesauce. Bananas. Vegetables Well-cooked vegetables. Canned or cooked vegetables such as carrots, green beans, beets, or spinach. Mashed or boiled potatoes. Grains  Hot cereals, such as cream of wheat and processed oatmeal. Rice. Bread, crackers, pasta, or tortillas made from refined white flour. Meats and other proteins  Eggs. Creamy peanut butter or other nut butters. Lean, well-cooked tender meats, such as beef, pork, chicken, or fish. Dairy Low-fat dairy products such as milk, cottage cheese, or yogurt. Beverages  Water. Herbal tea. Apple juice. Fats and oils Mild salad dressings. Canola or olive oil. Sweets and desserts Low-fat pudding, custard, or ice cream. Fruit gelatin. The items listed above may not be a complete list of foods and beverages you can eat. Contact a dietitian for more information. What foods should I avoid?  Fruits Citrus fruits, such as oranges and grapefruit. Fruits with a stringy texture. Fruits that have lots of seeds, such as kiwi or strawberries. Dried fruits. Vegetables Raw, uncooked vegetables. Salads. Grains Whole grain breads, muffins, and cereals. Meats and other proteins Tough, fibrous meats. Highly seasoned meat such as corned beef, smoked meats, or fish. Processed high-fat meats such as brats, hot dogs, or sausage. Dairy Full-fat dairy foods such as ice cream and cheese. Beverages Caffeinated drinks. Alcohol. Seasonings and condiments Strongly flavored seasonings or condiments. Hot sauce. Salsa. Other foods Spicy foods. Fried or greasy foods. Sour foods, such as pickled or fermented foods like sauerkraut. Foods high in  fiber. The items listed above may not be a complete list of foods and beverages you should avoid. Contact a dietitian for more information. Summary A bland diet should be based on individual tolerance. It may consist of foods that are soft textured and do not have a lot of fat, fiber, acid, or seasonings. A bland diet may be recommended because avoiding certain foods, beverages, or spices may make you feel better. This information is not intended to replace advice given to you by your health care provider. Make sure you discuss any questions you have with your health care provider. Document Revised: 03/09/2021 Document Reviewed: 03/09/2021 Elsevier Patient Education  2024 Elsevier Inc.   If you have been instructed to have an in-person evaluation today at a local Urgent Care facility, please use the link below. It will take you to a list of all of our available [facility], including address, phone number and hours of operation. Please do not delay care.  [facility]  If you or a family member do not have a primary care provider, use the link below to schedule a visit and establish care. When you choose a [facility] primary care physician or advanced practice provider, you gain a long-term partner in health. Find a Primary Care Provider  Learn more about [facility] in-office and virtual care options:  - Get Care Now

## 2023-07-12 NOTE — Progress Notes (Signed)
 Virtual Visit Consent   Monique Harrison, you are scheduled for a virtual visit with a [facility] provider today. Just as with appointments in the office, your consent must be obtained to participate. Your consent will be active for this visit and any virtual visit you may have with one of our providers in the next 365 days. If you have a MyChart account, a copy of this consent can be sent to you electronically.  As this is a virtual visit, video technology does not allow for your provider to perform a traditional examination. This may limit your provider's ability to fully assess your condition. If your provider identifies any concerns that need to be evaluated in person or the need to arrange testing (such as labs, EKG, etc.), we will make arrangements to do so. Although advances in technology are sophisticated, we cannot ensure that it will always work on either your end or our end. If the connection with a video visit is poor, the visit may have to be switched to a telephone visit. With either a video or telephone visit, we are not always able to ensure that we have a secure connection.  By engaging in this virtual visit, you consent to the provision of healthcare and authorize for your insurance to be billed (if applicable) for the services provided during this visit. Depending on your insurance coverage, you may receive a charge related to this service.  I need to obtain your verbal consent now. Are you willing to proceed with your visit today? Monique Harrison has provided verbal consent on 07/12/2023 for a virtual visit (video or telephone). Monique Harrison, [place]  Date: 07/12/2023 5:45 PM   Virtual Visit via Video Note   I, Monique Harrison, connected with  Monique Harrison  ([id number], 07-24-1975) on 07/12/23 at  5:45 PM EDT by a video-enabled telemedicine application and verified that I am speaking with the correct person using two identifiers.  Location: Patient: Virtual Visit  Location Patient: Home Provider: Virtual Visit Location Provider: Home Office   I discussed the limitations of evaluation and management by telemedicine and the availability of in person appointments. The patient expressed understanding and agreed to proceed.    History of Present Illness: Monique Harrison is a 48 y.o. who identifies as a female who was assigned female at birth, and is being seen today for nausea and vomiting over the past few days. Vomiting for first couple of days but now lingering nausea. Denies fever, diarrhea, stomach pain. Has history of GERD, well-controlled with Protonix which she endorses still taking as directed. Notes the Friday before last with flu-like symptoms -- chills, aches, cough -- mother was hospitalized with influenza around that time, but those symptoms resolved. Currently on Azithromycin three times weekly for several weeks by her allergist.  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   Ibuprofen Itching and Swelling    Tongue itches and swells    Can tolerate ibuprofen   Naproxen Sodium Other (See Comments)    Cause bleeding   Medications:  Current  Outpatient Medications:    ondansetron (ZOFRAN-ODT) 4 MG disintegrating tablet, Take 1 tablet (4 mg total) by mouth every 8 (eight) hours as needed for nausea or vomiting., Disp: 20 tablet, Rfl: 0   albuterol (PROVENTIL) (2.5 MG/3ML) 0.083% nebulizer solution, Take 3 mLs (2.5 mg total) by nebulization every 6 (six) hours as needed for up to 13 days for wheezing or shortness of breath., Disp: 150 mL, Rfl: 0   albuterol (VENTOLIN HFA) 108 (90 Base) MCG/ACT inhaler, Inhale 1-2 puffs into the lungs every 6 (six) hours as needed for wheezing or shortness of breath., Disp: 1 each, Rfl: 0   azithromycin (ZITHROMAX) 500 MG tablet, 1 tablet by mouth 3 times a week - Monday, Wednesday, Friday for 3 months., Disp: 52 tablet, Rfl: 0   benzonatate (TESSALON) 100 MG capsule, Take 1 capsule (100 mg total) by mouth every 8 (eight) hours., Disp: 21 capsule, Rfl: 0   Budeson-Glycopyrrol-Formoterol (BREZTRI AEROSPHERE) 160-9-4.8 MCG/ACT AERO, Inhale 2 puffs into the lungs in the morning and at bedtime. (Patient not taking: Reported on 01/11/2023), Disp: 2 each, Rfl: 0   Budeson-Glycopyrrol-Formoterol (BREZTRI AEROSPHERE) 160-9-4.8 MCG/ACT AERO, Inhale 2 puffs into the lungs in the morning and at bedtime., Disp: 10.7 g, Rfl: 5   budesonide (PULMICORT) 0.5 MG/2ML nebulizer solution, Use 1 vial mixed with nasal saline rinse 1-2 times daily, Disp: 75 mL, Rfl: 5   busPIRone (BUSPAR) 5 MG tablet, Take 5 mg by mouth 3 (three) times daily., Disp: , Rfl:    Cholecalciferol (VITAMIN D3) 125 MCG (5000 UT) CAPS, Take 5,000 Units by mouth daily., Disp: , Rfl:    fexofenadine-pseudoephedrine (ALLEGRA-D 24) 180-240 MG 24 hr tablet, Take 1 tablet by mouth daily as needed (allergies, sinus pressure). (Patient not taking: Reported on 06/24/2023), Disp: , Rfl:    Magnesium 250 MG TABS, Take 1 tablet by mouth daily., Disp: , Rfl:    pantoprazole (PROTONIX) 40 MG tablet, Take 40 mg by mouth daily., Disp: , Rfl:    sertraline (ZOLOFT) 50 MG  tablet, Take 100 mg by mouth daily., Disp: , Rfl:    ursodiol (ACTIGALL) 300 MG capsule, Take 300 mg by mouth 2 (two) times daily., Disp: , Rfl:   Observations/Objective: Patient is well-developed, well-nourished in no acute distress.  Resting comfortably at home.  Head is normocephalic, atraumatic.  No labored breathing. Speech is clear and coherent with logical content.  Patient is alert and oriented at baseline.   Assessment and Plan: 1. Nausea and vomiting, unspecified vomiting type (Primary) - ondansetron (ZOFRAN-ODT) 4 MG disintegrating tablet; Take 1 tablet (4 mg total) by mouth every 8 (eight) hours as needed for nausea or vomiting.  Dispense: 20 tablet; Refill: 0  Discussed possible viral etiology vs ongoing antibiotic use and impact on her gut micro biome. Supportive measures and OTC medications reviewed. Start Florastor. Zofran odt per orders. Follow-up in person if not resolving or any new/worsening symptoms.  Follow Up Instructions: I discussed the assessment and treatment plan with the patient. The patient was provided an opportunity to ask questions and all were answered. The patient agreed with the plan and demonstrated an understanding of the instructions.  A copy of instructions were sent to the patient via MyChart unless otherwise noted below.   The patient was advised to call back or seek an in-person evaluation if the symptoms worsen or if the condition fails to improve as anticipated.    Monique Climes, PA-C

## 2023-11-08 ENCOUNTER — Other Ambulatory Visit: Payer: Self-pay | Admitting: Obstetrics and Gynecology

## 2023-11-08 DIAGNOSIS — Z1231 Encounter for screening mammogram for malignant neoplasm of breast: Secondary | ICD-10-CM

## 2023-11-22 ENCOUNTER — Encounter: Payer: Self-pay | Admitting: Allergy and Immunology

## 2023-11-22 ENCOUNTER — Other Ambulatory Visit: Payer: Self-pay

## 2023-11-22 ENCOUNTER — Other Ambulatory Visit: Payer: Self-pay | Admitting: Allergy and Immunology

## 2023-11-22 ENCOUNTER — Ambulatory Visit: Admitting: Allergy and Immunology

## 2023-11-22 VITALS — BP 104/70 | HR 72 | Temp 98.0°F | Ht 65.35 in | Wt 245.4 lb

## 2023-11-22 DIAGNOSIS — B999 Unspecified infectious disease: Secondary | ICD-10-CM

## 2023-11-22 DIAGNOSIS — J4550 Severe persistent asthma, uncomplicated: Secondary | ICD-10-CM

## 2023-11-22 DIAGNOSIS — J329 Chronic sinusitis, unspecified: Secondary | ICD-10-CM

## 2023-11-22 DIAGNOSIS — D7219 Other eosinophilia: Secondary | ICD-10-CM

## 2023-11-22 DIAGNOSIS — J301 Allergic rhinitis due to pollen: Secondary | ICD-10-CM

## 2023-11-22 DIAGNOSIS — J3089 Other allergic rhinitis: Secondary | ICD-10-CM

## 2023-11-22 DIAGNOSIS — J310 Chronic rhinitis: Secondary | ICD-10-CM

## 2023-11-22 MED ORDER — NEBULIZER MASK ADULT MISC: 1.0000 | 1 refills | Status: AC

## 2023-11-22 MED ORDER — TEZEPELUMAB-EKKO 210 MG/1.91ML ~~LOC~~ SOSY
210.0000 mg | PREFILLED_SYRINGE | SUBCUTANEOUS | Status: AC
  Administered 2023-11-22: 210 mg via SUBCUTANEOUS

## 2023-11-22 MED ORDER — ALBUTEROL SULFATE (2.5 MG/3ML) 0.083% IN NEBU: 2.5000 mg | INHALATION_SOLUTION | Freq: Four times a day (QID) | RESPIRATORY_TRACT | 1 refills | Status: AC | PRN

## 2023-11-22 MED ORDER — CETIRIZINE HCL 10 MG PO TABS
10.0000 mg | ORAL_TABLET | Freq: Every day | ORAL | 1 refills | Status: AC | PRN
Start: 2023-11-22 — End: ?

## 2023-11-22 MED ORDER — BREZTRI AEROSPHERE 160-9-4.8 MCG/ACT IN AERO: 2.0000 | INHALATION_SPRAY | Freq: Two times a day (BID) | RESPIRATORY_TRACT | 1 refills | Status: DC

## 2023-11-22 MED ORDER — XHANCE 93 MCG/ACT NA EXHU: 2.0000 | INHALANT_SUSPENSION | Freq: Two times a day (BID) | NASAL | 1 refills | Status: DC

## 2023-11-22 MED ORDER — LEVOCETIRIZINE DIHYDROCHLORIDE 5 MG PO TABS: 5.0000 mg | ORAL_TABLET | Freq: Every day | ORAL | 1 refills | Status: DC | PRN

## 2023-11-22 MED ORDER — AIRSUPRA 90-80 MCG/ACT IN AERO: 2.0000 | INHALATION_SPRAY | RESPIRATORY_TRACT | 1 refills | Status: DC | PRN

## 2023-11-22 MED ORDER — BUDESONIDE 0.5 MG/2ML IN SUSP: RESPIRATORY_TRACT | 5 refills | Status: AC

## 2023-11-22 NOTE — Progress Notes (Unsigned)
 Immunotherapy   Patient Details  Name: Monique Harrison MRN: [id number] Date of Birth: [birth year]  11/22/2023  Monique Harrison started injections for  Tezspire  Following schedule: Every twenty eight days.  Frequency:Every four weeks.  Epi-Pen:Not needed.  Consent signed in office today and patient instructions given. Patient sat in room eight for fifteen minute without an issue.    Monique Harrison 11/22/2023, 10:32 AM

## 2023-11-22 NOTE — Patient Instructions (Addendum)
   1. Allergen avoidance measures - cat, dog, dust mite, tree, grass, weed  2. Treat and prevent inflammation of airway:   A. Breztri  - 2 inhalations 2 times per day (empty lungs)  B. Xhance  - 2 sprays each nostril 2 times per day  C. Tezepelumab  injection today and every 4 weeks  3. Discontinue OTC Sinex / oxymetazoline nasal spray  4. If needed:   A. AIRSUPRA  - 2 inhalations every 4-6 hours (replaces albuterol )  B. Nasal saline  C. Antihistamine   5. Return to clinic in 4 weeks or earlier if needed  6. Influenza = Tamiflu. Covid = Paxlovid

## 2023-11-22 NOTE — Progress Notes (Unsigned)
 [employer] - [place] - [place] - [place] - [place]   Follow-up Note  Referring Provider: Orlando Lonni PARAS* Primary Provider: Orlando Lonni PARAS, MD Date of Office Visit: 11/22/2023  Subjective:   Monique Harrison (DOB: 11/04/1975) is a 48 y.o. female who [facility] in re-evaluation of the following:  HPI: Monique Harrison is a 48 y.o. female presenting today for follow-up of asthma, chronic rhinitis with allergic component and recurrent sinusitis/infections.  She was last seen in the office on 06/24/2023 by Dr. Jeneal.  [name] says that she has not had improvement in her symptoms over the past few months. She has had persistent nasal congestion with mucus production, both anteriorly and posteriorly draining, as well as sneezing. The patient used budesonide  sinus rinse for a short duration, but only found temporary relief. During this period she felt dizzy following usage of the sinus rinse and stopped. She has used Sinex nasal spray regularly and an oral antihistamine. The patient used Azithromycin  500 mg once daily on M/W/F for a couple of months, but did not find much improvement. She has not had any breakthrough infections since February.  Her breathing is not well controlled and she has had continued wheezing and coughing. The wheezing is worse at night and wakes her up 3-4 times per week. She has been using her Breztri  inhaler twice daily as well as some albuterol , which offers temporary relief. Laughing or deep breathing will instigate coughing.   The patient lives with a cat and a dog in the house for over a year.  Allergies as of 11/22/2023       Reactions   Aspirin Shortness Of Breath   Pt tolerates aspirin for occasional use.   Ibuprofen Itching, Swelling   Tongue itches and swells    Can tolerate ibuprofen   Naproxen Sodium Other (See Comments)   Cause bleeding        Medication List    albuterol  108  (90 Base) MCG/ACT inhaler Commonly known as: VENTOLIN  HFA Inhale 1-2 puffs into the lungs every 6 (six) hours as needed for wheezing or shortness of breath.   albuterol  (2.5 MG/3ML) 0.083% nebulizer solution Commonly known as: PROVENTIL  Take 3 mLs (2.5 mg total) by nebulization every 6 (six) hours as needed for up to 13 days for wheezing or shortness of breath.   azithromycin  500 MG tablet Commonly known as: ZITHROMAX  1 tablet by mouth 3 times a week - Monday, Wednesday, Friday for 3 months.   benzonatate  100 MG capsule Commonly known as: TESSALON  Take 1 capsule (100 mg total) by mouth every 8 (eight) hours.   Breztri  Aerosphere 160-9-4.8 MCG/ACT Aero inhaler Generic drug: budesonide -glycopyrrolate-formoterol  Inhale 2 puffs into the lungs in the morning and at bedtime.   budesonide  0.5 MG/2ML nebulizer solution Commonly known as: PULMICORT  Use 1 vial mixed with nasal saline rinse 1-2 times daily   buPROPion 150 MG 24 hr tablet Commonly known as: WELLBUTRIN XL Take 150 mg by mouth daily.   busPIRone 5 MG tablet Commonly known as: BUSPAR Take 5 mg by mouth 3 (three) times daily.   fexofenadine-pseudoephedrine 180-240 MG 24 hr tablet Commonly known as: ALLEGRA-D 24 Take 1 tablet by mouth daily as needed (allergies, sinus pressure).   Magnesium 250 MG Tabs Take 1 tablet by mouth daily.   ondansetron  4 MG disintegrating tablet Commonly known as: ZOFRAN -ODT Take 1 tablet (4 mg total) by mouth every 8 (eight) hours as needed for nausea or  vomiting.   pantoprazole  40 MG tablet Commonly known as: PROTONIX  Take 40 mg by mouth daily.   saccharomyces boulardii 250 MG capsule Commonly known as: Florastor Take 1 capsule (250 mg total) by mouth 2 (two) times daily.   sertraline  50 MG tablet Commonly known as: ZOLOFT  Take 100 mg by mouth daily.   ursodiol  300 MG capsule Commonly known as: ACTIGALL  Take 300 mg by mouth 2 (two) times daily.   Vitamin D3 125 MCG (5000 UT)  Caps Take 5,000 Units by mouth daily.    Past Medical History:  Diagnosis Date   Abnormal LFTs 2005   Asthma    Recurrent upper respiratory infection (URI)    Upper GI bleed 2016   [place], [place]; presented with melena; admitted for 1 week; thought to be due to frequent Naproxen use   Vitamin D  deficiency     Past Surgical History:  Procedure Laterality Date   BIOPSY  02/07/2018   Procedure: BIOPSY;  Surgeon: Eda Iha, MD;  Location: [facility];  Service: Gastroenterology;;   CHOLECYSTECTOMY     CHOLECYSTECTOMY, LAPAROSCOPIC     ESOPHAGOGASTRODUODENOSCOPY (EGD) WITH PROPOFOL  N/A 02/07/2018   Procedure: ESOPHAGOGASTRODUODENOSCOPY (EGD) WITH PROPOFOL ;  Surgeon: Eda Iha, MD;  Location: [facility] ENDOSCOPY;  Service: Gastroenterology;  Laterality: N/A;   LIVER SURGERY     NO PAST SURGERIES      Review of systems negative except as noted in HPI / PMHx or noted below:  Review of Systems  Constitutional:  Negative for fever.  HENT:  Positive for congestion. Negative for sore throat.        Positive for sneezing, rhinorrhea, PND  Eyes:  Negative for discharge and redness.  Respiratory:  Positive for cough, shortness of breath and wheezing.   Cardiovascular:  Negative for chest pain.  Skin:  Negative for itching and rash.  Neurological:  Positive for dizziness (temporary, resolved). Negative for headaches.  All other systems reviewed and are negative.    Objective:   Vitals:   11/22/23 0934  BP: 104/70  Pulse: 72  Temp: 98 F (36.7 C)  SpO2: 97%   Height: 5' 5.35 (166 cm)  Weight: 245 lb 6.4 oz (111.3 kg)   Physical Exam Constitutional:      Appearance: Normal appearance.  HENT:     Head: Normocephalic and atraumatic.     Right Ear: Tympanic membrane normal.     Left Ear: Tympanic membrane normal.     Nose: Congestion present.     Comments: Hyperemic nasal mucosa with turbinate hypertrophy    Mouth/Throat:     Mouth: Mucous membranes are moist.   Cardiovascular:     Rate and Rhythm: Normal rate and regular rhythm.  Pulmonary:     Effort: Pulmonary effort is normal. No respiratory distress.     Breath sounds: Rhonchi (present on expiration, diffuse) present.     Comments: Deep breathing or laughing instigates coughing Musculoskeletal:        General: Normal range of motion.     Cervical back: Normal range of motion.  Skin:    General: Skin is warm.     Findings: No rash.  Neurological:     General: No focal deficit present.     Mental Status: She is alert.  Psychiatric:        Mood and Affect: Mood normal.        Behavior: Behavior normal.     Diagnostics: Spirometry was performed and demonstrated an FEV1 of 1.37 at 53 %  of predicted.  Results of a chest x-ray obtained 11 June 2023 identified the following:  No focal consolidation, pleural effusion, or pneumothorax. The cardiac silhouette is within normal limits. Left breast implant. No acute osseous pathology.  Results of a sinus CT scan obtained 20 December 2022 identified the following:  Paranasal sinuses:  Frontal: The frontal sinuses appear clear. The frontoethmoidal recesses are not well assessed.  Ethmoid: There is partial opacification of the left ethmoid air cells with some frothy secretion.  Maxillary: There is mucosal thickening in both maxillary sinuses with layering fluid on the right.  Sphenoid: There is mild mucosal thickening in the right maxillary sinus and a small amount of layering frothy fluid on the left. The sphenoethmoidal recesses appear clear.  Right ostiomeatal unit: Not well assessed due to technique.  Left ostiomeatal unit: Not well assessed due to technique.  Nasal passages: Patent. Intact nasal septum is midline.  Other: The imaged soft tissues are unremarkable. The imaged intracranial compartment is unremarkable. The mastoid air cells and middle ear cavities are clear.  Results of blood test obtained 20 October 2022 identified WBC  11.3, absolute eosinophil 900, absolute lymphocyte 1200 hemoglobin 10.5, platelet 357  Results of blood test obtained 01 January 2022 identified IgG 1375 Mg/DL, IgE 897 KU/L  Results of blood tests obtained 22 July 2021 identified hypersensitivity directed against cat at 9.02 KU/L, dog at 1.39 KU/L, Timothy grass of 2.48 KU/L, and low levels of IgE antibodies directed against dust mite, other species of grass, trees, weeds  Assessment and Plan:   1. Not well controlled severe persistent asthma   2. Perennial allergic rhinitis   3. Seasonal allergic rhinitis due to pollen   4. Rhinitis medicamentosa   5. Other eosinophilia   6. Recurrent sinus infections    1. Allergen avoidance measures - cat, dog, dust mite, tree, grass, weed  2. Treat and prevent inflammation of airway:   A. Breztri  - 2 inhalations 2 times per day (empty lungs)  B. Xhance  - 2 sprays each nostril 2 times per day  C. Tezepelumab  injection today and every 4 weeks  3. Discontinue OTC Sinex / oxymetazoline nasal spray  4. If needed:   A. AIRSUPRA  - 2 inhalations every 4-6 hours (replaces albuterol )  B. Nasal saline  C. Antihistamine   5. Return to clinic in 4 weeks or earlier if needed  6. Influenza = Tamiflu. 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 She has not found improvement with nasal steroids or prophylactic azithromycin  of her nasal congestion and mucus production. March 2023 allergy testing was positive against dust mites, cat, dog, grasses, trees, and weed pollens. She has a history of frequent infections, including sinus infections warranting antibiotics and pneumonia and empyema for which she received a chest tube. She had a mild titer improvement (52-56%) with pneumococcal booster that remained for two years, but I do not suspect B-cell maturation abnormality or antibody specificity problem as she makes plenty of antibodies to  aeroallergens. FEV1 today is 53% after months of Breztri  twice daily and clinically uncontrolled asthma by history. With persistent eosinophilia (300-900), allergen specific IgE, and refractory asthma despite triple therapy inhaler, I suspect that she would benefit from biologic therapy. We discussed starting tezepelumab  for asthma and will do first injection today. She may also find benefit to her allergy and chronic sinus disease, but I recommended starting immunotherapy to target and alleviate allergic burden contributing to  her chronic rhinosinusitis and asthma. We will replace her unopposed albuterol  with Airsupra , have her discontinue OTC Sinex with oxymetazoline which could be causing rhinitis medicamentosa-- replaced with Xhance  due to insurance. She may return to clinic In 4 weeks.  Donnice Mutter, MS4 [facility]  I provided oversight and direction for Donnice Sayres, [employer] med student, concerning the care of Cyndra Feinberg during today's visit.  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  She is very atopic and it would be best for her to perform allergen avoidance measures but that is probably not going to happen as she has a cat and dog located inside the household.  It would be best for her to consider starting a course of immunotherapy and we will approach this issue when she returns to this clinic in 4 weeks.  Camellia Denis, MD Allergy / Immunology [facility]

## 2023-11-23 ENCOUNTER — Encounter: Payer: Self-pay | Admitting: Allergy and Immunology

## 2023-12-12 ENCOUNTER — Telehealth: Payer: Self-pay

## 2023-12-12 MED ORDER — XHANCE 93 MCG/ACT NA EXHU: INHALANT_SUSPENSION | NASAL | 5 refills | Status: DC

## 2023-12-12 NOTE — Telephone Encounter (Signed)
 Patient called into the office wanted a follow up about starting tezspire . She was informed that Tammy, biologic coordinator handles the initial set up with all biologics. Forwarding this to Tammy as well. Did ask for Xhance  refill. Sent into [facility]. Verbalized understanding.

## 2023-12-13 MED ORDER — XHANCE 93 MCG/ACT NA EXHU: INHALANT_SUSPENSION | NASAL | 3 refills | Status: DC

## 2023-12-13 NOTE — Telephone Encounter (Signed)
 Called patient and advised approval, copay card and submit to [employer] for Tezspire . Delay was due to never getting note patient started on therapy. Patient admin at home instructed on delivery, storage and dosing for same

## 2023-12-13 NOTE — Addendum Note (Signed)
 Addended by: MARCINE ISAIAH CROME on: 12/13/2023 10:03 AM   Modules accepted: Orders

## 2023-12-13 NOTE — Telephone Encounter (Signed)
 Patient advised that rx at [facility] for Flonase  suppose to take Xhance  and [facility] told her they were trying to reach us  in regards to script. Please look into and advise patient

## 2023-12-16 ENCOUNTER — Ambulatory Visit
Admission: RE | Admit: 2023-12-16 | Discharge: 2023-12-16 | Disposition: A | Discharge status: Home / Self Care | Source: Ambulatory Visit | Attending: Obstetrics and Gynecology | Admitting: Obstetrics and Gynecology

## 2023-12-16 ENCOUNTER — Ambulatory Visit

## 2023-12-16 DIAGNOSIS — Z1231 Encounter for screening mammogram for malignant neoplasm of breast: Secondary | ICD-10-CM

## 2023-12-18 NOTE — Progress Notes (Unsigned)
   [address] [place] [address] Dept: [phone]  FOLLOW UP NOTE  Patient ID: Monique Harrison, female    DOB: Sep 09, 1975  Age: 47 y.o. MRN: [id number] Date of Office Visit: 12/19/2023  Assessment  Chief Complaint: No chief complaint on file.  HPI Monique Harrison is a 48 year old female who presents to the clinic for a follow up visit. She was last seen in this clinic on 11/22/2023 for evaluation of asthma, allergic rhinitis, and recurrent infection.   Discussed the use of AI scribe software for clinical note transcription with the patient, who gave verbal consent to proceed.  History of Present Illness      Drug Allergies:  Allergies  Allergen Reactions   Aspirin Shortness Of Breath    Pt tolerates aspirin for occasional use.   Ibuprofen Itching and Swelling    Tongue itches and swells    Can tolerate ibuprofen   Naproxen Sodium Other (See Comments)    Cause bleeding    Physical Exam: There were no vitals taken for this visit.   Physical Exam  Diagnostics:    Assessment and Plan: No diagnosis found.  No orders of the defined types were placed in this encounter.   There are no Patient Instructions on file for this visit.  No follow-ups on file.    Thank you for the opportunity to care for this patient.  Please do not hesitate to contact me with questions.  Arlean Mutter, FNP [facility]

## 2023-12-18 NOTE — Patient Instructions (Incomplete)
   1. Allergen avoidance measures - cat, dog, dust mite, tree, grass, weed  2. Treat and prevent inflammation of airway:   A. Breztri  - 2 inhalations 2 times per day (empty lungs)  B. Xhance  - 2 sprays each nostril 2 times per day  C. Tezepelumab  injection today and every 4 weeks  3. Discontinue OTC Sinex / oxymetazoline nasal spray  4. If needed:   A. AIRSUPRA  - 2 inhalations every 4-6 hours (replaces albuterol )  B. Nasal saline  C. Antihistamine   5. Return to clinic in 4 weeks or earlier if needed  6. Influenza = Tamiflu. Covid = Paxlovid

## 2023-12-19 ENCOUNTER — Encounter: Payer: Self-pay | Admitting: Family Medicine

## 2023-12-19 ENCOUNTER — Ambulatory Visit: Admitting: Family Medicine

## 2023-12-19 VITALS — BP 100/60 | HR 79 | Temp 97.9°F | Resp 16 | Ht 65.0 in | Wt 245.6 lb

## 2023-12-19 DIAGNOSIS — J3089 Other allergic rhinitis: Secondary | ICD-10-CM | POA: Insufficient documentation

## 2023-12-19 DIAGNOSIS — J329 Chronic sinusitis, unspecified: Secondary | ICD-10-CM | POA: Diagnosis not present

## 2023-12-19 DIAGNOSIS — H1013 Acute atopic conjunctivitis, bilateral: Secondary | ICD-10-CM | POA: Insufficient documentation

## 2023-12-19 DIAGNOSIS — J4550 Severe persistent asthma, uncomplicated: Secondary | ICD-10-CM | POA: Insufficient documentation

## 2023-12-19 MED ORDER — LEVOCETIRIZINE DIHYDROCHLORIDE 5 MG PO TABS: 5.0000 mg | ORAL_TABLET | Freq: Every day | ORAL | 1 refills | Status: DC | PRN

## 2023-12-19 MED ORDER — BREZTRI AEROSPHERE 160-9-4.8 MCG/ACT IN AERO: 2.0000 | INHALATION_SPRAY | Freq: Two times a day (BID) | RESPIRATORY_TRACT | 1 refills | Status: DC

## 2023-12-19 MED ORDER — XHANCE 93 MCG/ACT NA EXHU: INHALANT_SUSPENSION | NASAL | 5 refills | Status: DC

## 2023-12-19 MED ORDER — AIRSUPRA 90-80 MCG/ACT IN AERO: 2.0000 | INHALATION_SPRAY | RESPIRATORY_TRACT | 1 refills | Status: DC | PRN

## 2024-01-17 ENCOUNTER — Encounter: Payer: Self-pay | Admitting: Allergy and Immunology

## 2024-01-17 ENCOUNTER — Ambulatory Visit: Admitting: Allergy and Immunology

## 2024-01-17 ENCOUNTER — Other Ambulatory Visit: Payer: Self-pay

## 2024-01-17 VITALS — BP 104/72 | HR 92 | Temp 98.0°F | Wt 232.4 lb

## 2024-01-17 DIAGNOSIS — J4550 Severe persistent asthma, uncomplicated: Secondary | ICD-10-CM

## 2024-01-17 DIAGNOSIS — J301 Allergic rhinitis due to pollen: Secondary | ICD-10-CM

## 2024-01-17 DIAGNOSIS — J3089 Other allergic rhinitis: Secondary | ICD-10-CM | POA: Diagnosis not present

## 2024-01-17 MED ORDER — AIRSUPRA 90-80 MCG/ACT IN AERO: 2.0000 | INHALATION_SPRAY | RESPIRATORY_TRACT | 1 refills | Status: AC | PRN

## 2024-01-17 MED ORDER — XHANCE 93 MCG/ACT NA EXHU: 2.0000 | INHALANT_SUSPENSION | Freq: Two times a day (BID) | NASAL | 1 refills | Status: DC

## 2024-01-17 MED ORDER — LEVOCETIRIZINE DIHYDROCHLORIDE 5 MG PO TABS: 5.0000 mg | ORAL_TABLET | Freq: Every day | ORAL | 1 refills | Status: AC | PRN

## 2024-01-17 MED ORDER — SPACER/AERO-HOLDING CHAMBERS DEVI: 1.0000 | 1 refills | Status: AC

## 2024-01-17 MED ORDER — BREZTRI AEROSPHERE 160-9-4.8 MCG/ACT IN AERO: 2.0000 | INHALATION_SPRAY | Freq: Two times a day (BID) | RESPIRATORY_TRACT | 1 refills | Status: AC

## 2024-01-17 NOTE — Patient Instructions (Addendum)
   1. Allergen avoidance measures - cat, dog, dust mite, tree, grass, weed  2. Treat and prevent inflammation of airway:   A. Breztri  - 2 inhalations 2 times per day with a spacer (empty lungs)  B. Xhance  - 2 sprays each nostril 2 times per day  C. Tezepelumab  once every 4 weeks  D. Prednisone  40 mg delivered in clinic today  3. If needed:   A. AIRSUPRA  - 2 inhalations every 4-6 hours (replaces albuterol )  B. Nasal saline rinses  C. Levocetirizine 5 mg - 1 tablet 1 time per day  4. Return to clinic in February 2026 or earlier if needed  5. Influenza = Tamiflu. Covid = Paxlovid

## 2024-01-17 NOTE — Progress Notes (Unsigned)
 [place] [facility] [place] [facility] [place] - [place] [facility] [place]   Follow-up Note  Referring Provider: Orlando Lonni PARAS* Primary Provider: Orlando Lonni PARAS, MD Date of Office Visit: 01/17/2024  Subjective:   Monique Harrison (DOB: 27-Apr-1976) is a 48 y.o. female who [facility]24 in re-evaluation of the following:  HPI: Monique Harrison returns to this clinic in reevaluation of asthma, allergic rhinitis, and history of recurrent infections.  Monique Harrison last saw Monique Harrison in this clinic 22 November 2023 and Monique Harrison visited with our nurse practitioner on 19 December 2023.  Monique Harrison has done much better since using tezepelumab  but unfortunately Monique Harrison August dose was not administered correctly and Monique Harrison really did not receive that dose.  Monique Harrison underwent placement of a gastric sleeve on 10 January 2024 and Monique Harrison developed coughing soon thereafter and Monique Harrison had some green sputum production but fortunately Monique Harrison green is resolved and Monique Harrison cough is decreased but Monique Harrison still little congested in Monique Harrison chest.  No fever, no chest pain, no anosmia, no associated systemic or constitutional symptoms.  Monique Harrison has had very little problems with Monique Harrison upper airway while using Monique Harrison Xhance  mostly 1 time per day.  Allergies as of 01/17/2024       Reactions   Aspirin Shortness Of Breath   Pt tolerates aspirin for occasional use.   Ibuprofen Itching, Swelling   Tongue itches and swells    Can tolerate ibuprofen   Naproxen Sodium Other (See Comments)   Cause bleeding        Medication List    acetaminophen  500 MG tablet Commonly known as: TYLENOL  Take 1,000 mg by mouth as needed.   Airsupra  90-80 MCG/ACT Aero Generic drug: Albuterol -Budesonide  Inhale 2 Inhalations into the lungs every 4 (four) hours as needed.   albuterol  108 (90 Base) MCG/ACT inhaler Commonly known as: VENTOLIN  HFA Inhale 1-2 puffs into the lungs every 6 (six) hours as needed for wheezing or shortness of breath.   albuterol  (2.5  MG/3ML) 0.083% nebulizer solution Commonly known as: PROVENTIL  Take 3 mLs (2.5 mg total) by nebulization every 6 (six) hours as needed for wheezing or shortness of breath.   azithromycin  500 MG tablet Commonly known as: ZITHROMAX  1 tablet by mouth 3 times a week - Monday, Wednesday, Friday for 3 months.   benzonatate  100 MG capsule Commonly known as: TESSALON  Take 1 capsule (100 mg total) by mouth every 8 (eight) hours.   Breztri  Aerosphere 160-9-4.8 MCG/ACT Aero inhaler Generic drug: budesonide -glycopyrrolate-formoterol  Inhale 2 puffs into the lungs in the morning and at bedtime.   budesonide  0.5 MG/2ML nebulizer solution Commonly known as: PULMICORT  Use 1 vial mixed with nasal saline rinse 1-2 times daily   buPROPion 150 MG 24 hr tablet Commonly known as: WELLBUTRIN XL Take 150 mg by mouth daily.   busPIRone 5 MG tablet Commonly known as: BUSPAR Take 5 mg by mouth 3 (three) times daily.   cetirizine  10 MG tablet Commonly known as: ZYRTEC  Take 1 tablet (10 mg total) by mouth daily as needed.   fexofenadine-pseudoephedrine 180-240 MG 24 hr tablet Commonly known as: ALLEGRA-D 24 Take 1 tablet by mouth daily as needed (allergies, sinus pressure).   levocetirizine 5 MG tablet Commonly known as: XYZAL  Take 1 tablet (5 mg total) by mouth daily as needed for allergies (Can take an extra dose during flare ups.).   Magnesium 250 MG Tabs Take 1 tablet by mouth daily.   Nebulizer Mask Adult Misc 1 kit by Does not apply  route as directed.   ondansetron  4 MG disintegrating tablet Commonly known as: ZOFRAN -ODT Take 1 tablet (4 mg total) by mouth every 8 (eight) hours as needed for nausea or vomiting.   pantoprazole  40 MG tablet Commonly known as: PROTONIX  Take 40 mg by mouth daily.   saccharomyces boulardii 250 MG capsule Commonly known as: Florastor Take 1 capsule (250 mg total) by mouth 2 (two) times daily.   sertraline  50 MG tablet Commonly known as: ZOLOFT  Take 100  mg by mouth daily.   Tezspire  210 MG/1. Soaj Generic drug: Tezepelumab -ekko Inject 210 mg into the muscle every 30 (thirty) days.   ursodiol  300 MG capsule Commonly known as: ACTIGALL  Take 300 mg by mouth 2 (two) times daily.   Vitamin D3 125 MCG (5000 UT) Caps Take 5,000 Units by mouth daily.   Xhance  93 MCG/ACT Exhu Generic drug: Fluticasone  Propionate 2 sprays each nostril 2 times per day per provider   Xhance  93 MCG/ACT Exhu Generic drug: Fluticasone  Propionate 2 sprays each nostril 2 times per day    Past Medical History:  Diagnosis Date   Abnormal LFTs 2005   Asthma    Recurrent upper respiratory infection (URI)    Upper GI bleed 2016   [place], [place]; presented with melena; admitted for 1 week; thought to be due to frequent Naproxen use   Vitamin D  deficiency     Past Surgical History:  Procedure Laterality Date   BIOPSY  02/07/2018   Procedure: BIOPSY;  Surgeon: Eda Iha, MD;  Location: [facility] ENDOSCOPY;  Service: Gastroenterology;;   CHOLECYSTECTOMY     CHOLECYSTECTOMY, LAPAROSCOPIC     ESOPHAGOGASTRODUODENOSCOPY (EGD) WITH PROPOFOL  N/A 02/07/2018   Procedure: ESOPHAGOGASTRODUODENOSCOPY (EGD) WITH PROPOFOL ;  Surgeon: Eda Iha, MD;  Location: [facility] ENDOSCOPY;  Service: Gastroenterology;  Laterality: N/A;   LIVER SURGERY     NO PAST SURGERIES      Review of systems negative except as noted in HPI / PMHx or noted below:  Review of Systems  Constitutional: Negative.   HENT: Negative.    Eyes: Negative.   Respiratory: Negative.    Cardiovascular: Negative.   Gastrointestinal: Negative.   Genitourinary: Negative.   Musculoskeletal: Negative.   Skin: Negative.   Neurological: Negative.   Endo/Heme/Allergies: Negative.   Psychiatric/Behavioral: Negative.       Objective:   Vitals:   01/17/24 1546  BP: 104/72  Pulse: 92  SpO2: 93%      Weight: 232 lb 6.4 oz (105.4 kg)   Physical Exam Constitutional:      Appearance: Monique Harrison is not  diaphoretic.  HENT:     Head: Normocephalic.     Right Ear: Tympanic membrane, ear canal and external ear normal.     Left Ear: Tympanic membrane, ear canal and external ear normal.     Nose: Nose normal. No mucosal edema or rhinorrhea.     Mouth/Throat:     Pharynx: Uvula midline. No oropharyngeal exudate.  Eyes:     Conjunctiva/sclera: Conjunctivae normal.  Neck:     Thyroid: No thyromegaly.     Trachea: Trachea normal. No tracheal tenderness or tracheal deviation.  Cardiovascular:     Rate and Rhythm: Normal rate and regular rhythm.     Heart sounds: Normal heart sounds, S1 normal and S2 normal. No murmur heard. Pulmonary:     Effort: No respiratory distress.     Breath sounds: Normal breath sounds. No stridor. No wheezing (Expiratory wheezing both lung fields) or rales.  Lymphadenopathy:  Head:     Right side of head: No tonsillar adenopathy.     Left side of head: No tonsillar adenopathy.     Cervical: No cervical adenopathy.  Skin:    Findings: No erythema or rash.     Nails: There is no clubbing.  Neurological:     Mental Status: Monique Harrison is alert.     Diagnostics: Spirometry was not performed secondary to Monique Harrison recent gastric sleeve procedure.  Assessment and Plan:   1. Not well controlled severe persistent asthma   2. Perennial allergic rhinitis   3. Seasonal allergic rhinitis due to pollen     1. Allergen avoidance measures - cat, dog, dust mite, tree, grass, weed  2. Treat and prevent inflammation of airway:   A. Breztri  - 2 inhalations 2 times per day with a spacer (empty lungs)  B. Xhance  - 2 sprays each nostril 1-2 times per day  C. Tezepelumab  once every 4 weeks  D. Prednisone  40 mg delivered in clinic today  3. If needed:   A. AIRSUPRA  - 2 inhalations every 4-6 hours (replaces albuterol )  B. Nasal saline rinses  C. Levocetirizine 5 mg - 1 tablet 1 time per day  4. Return to clinic in February 2025 or earlier if needed  5. Influenza = Tamiflu.  Covid = Paxlovid  Ambur appears to have some inflammation of Monique Harrison lower airway most likely secondary to either the anesthesia Monique Harrison received during placement of Monique Harrison gastric sleeve or possibly contracting a viral respiratory tract infection and Monique Harrison is certainly improving and Monique Harrison am going to give Monique Harrison 1 dose of prednisone  to help Monique Harrison along with this improvement while Monique Harrison maintains anti-inflammatory therapy for both Monique Harrison upper and lower airway with the therapy noted above.  Assuming Monique Harrison does well we will see Monique Harrison back in this clinic in February 2025 or earlier if there is a problem.  Camellia Denis, MD Allergy / Immunology [facility]

## 2024-01-18 ENCOUNTER — Encounter: Payer: Self-pay | Admitting: Allergy and Immunology

## 2024-01-19 ENCOUNTER — Encounter: Payer: Self-pay | Admitting: Allergy and Immunology

## 2024-01-19 MED ORDER — XHANCE 93 MCG/ACT NA EXHU: 2.0000 | INHALANT_SUSPENSION | Freq: Two times a day (BID) | NASAL | 1 refills | Status: AC

## 2024-02-08 MED ORDER — RYALTRIS 665-25 MCG/ACT NA SUSP: 2.0000 | Freq: Two times a day (BID) | NASAL | 5 refills | Status: AC | PRN

## 2024-02-08 NOTE — Addendum Note (Signed)
 Addended by: Amare Bail E on: 02/08/2024 05:58 PM   Modules accepted: Orders

## 2024-04-24 ENCOUNTER — Other Ambulatory Visit: Payer: Self-pay | Admitting: *Deleted

## 2024-04-24 ENCOUNTER — Other Ambulatory Visit (HOSPITAL_COMMUNITY): Payer: Self-pay

## 2024-04-24 MED ORDER — TEZSPIRE 210 MG/1.91ML ~~LOC~~ SOAJ: 210.0000 mg | SUBCUTANEOUS | 11 refills | Status: AC

## 2024-06-12 ENCOUNTER — Ambulatory Visit: Admitting: Allergy and Immunology

## 2024-07-03 ENCOUNTER — Ambulatory Visit: Admitting: Allergy and Immunology
